# Patient Record
Sex: Male | Born: 1950
Health system: Southern US, Community
[De-identification: ages and names within clinical notes are randomized; demographics above are authoritative.]

## PROBLEM LIST (undated history)

## (undated) DIAGNOSIS — E785 Hyperlipidemia, unspecified: Secondary | ICD-10-CM

## (undated) DIAGNOSIS — I10 Essential (primary) hypertension: Secondary | ICD-10-CM

## (undated) DIAGNOSIS — E119 Type 2 diabetes mellitus without complications: Secondary | ICD-10-CM

## (undated) DIAGNOSIS — C61 Malignant neoplasm of prostate: Secondary | ICD-10-CM

## (undated) HISTORY — DX: Essential (primary) hypertension: I10

## (undated) HISTORY — DX: Hyperlipidemia, unspecified: E78.5

## (undated) HISTORY — PX: OTHER SURGICAL HISTORY: SHX169

## (undated) HISTORY — DX: Malignant neoplasm of prostate: C61

## (undated) HISTORY — DX: Type 2 diabetes mellitus without complications: E11.9

---

## 2005-06-18 ENCOUNTER — Ambulatory Visit: Payer: Self-pay | Admitting: Gastroenterology

## 2005-06-24 ENCOUNTER — Ambulatory Visit: Payer: Self-pay | Admitting: Gastroenterology

## 2007-04-13 HISTORY — PX: HERNIA REPAIR: SHX51

## 2008-01-23 ENCOUNTER — Ambulatory Visit: Payer: Self-pay | Admitting: Rheumatology

## 2014-09-17 DIAGNOSIS — R972 Elevated prostate specific antigen [PSA]: Secondary | ICD-10-CM | POA: Diagnosis not present

## 2014-10-18 DIAGNOSIS — R972 Elevated prostate specific antigen [PSA]: Secondary | ICD-10-CM | POA: Insufficient documentation

## 2014-11-14 DIAGNOSIS — M545 Low back pain: Secondary | ICD-10-CM | POA: Diagnosis not present

## 2014-11-14 DIAGNOSIS — E782 Mixed hyperlipidemia: Secondary | ICD-10-CM | POA: Diagnosis not present

## 2014-11-14 DIAGNOSIS — I10 Essential (primary) hypertension: Secondary | ICD-10-CM | POA: Diagnosis not present

## 2014-11-14 DIAGNOSIS — L409 Psoriasis, unspecified: Secondary | ICD-10-CM | POA: Diagnosis not present

## 2014-11-14 DIAGNOSIS — Z0001 Encounter for general adult medical examination with abnormal findings: Secondary | ICD-10-CM | POA: Diagnosis not present

## 2014-11-14 DIAGNOSIS — M064 Inflammatory polyarthropathy: Secondary | ICD-10-CM | POA: Diagnosis not present

## 2014-12-14 DIAGNOSIS — Z23 Encounter for immunization: Secondary | ICD-10-CM | POA: Diagnosis not present

## 2015-02-14 DIAGNOSIS — E782 Mixed hyperlipidemia: Secondary | ICD-10-CM | POA: Diagnosis not present

## 2015-02-14 DIAGNOSIS — N402 Nodular prostate without lower urinary tract symptoms: Secondary | ICD-10-CM | POA: Diagnosis not present

## 2015-02-14 DIAGNOSIS — I10 Essential (primary) hypertension: Secondary | ICD-10-CM | POA: Diagnosis not present

## 2015-02-14 DIAGNOSIS — M545 Low back pain: Secondary | ICD-10-CM | POA: Diagnosis not present

## 2015-02-14 DIAGNOSIS — K219 Gastro-esophageal reflux disease without esophagitis: Secondary | ICD-10-CM | POA: Diagnosis not present

## 2015-02-14 DIAGNOSIS — M064 Inflammatory polyarthropathy: Secondary | ICD-10-CM | POA: Diagnosis not present

## 2015-05-12 DIAGNOSIS — E782 Mixed hyperlipidemia: Secondary | ICD-10-CM | POA: Diagnosis not present

## 2015-05-12 DIAGNOSIS — I10 Essential (primary) hypertension: Secondary | ICD-10-CM | POA: Diagnosis not present

## 2015-05-12 DIAGNOSIS — M150 Primary generalized (osteo)arthritis: Secondary | ICD-10-CM | POA: Diagnosis not present

## 2015-05-12 DIAGNOSIS — M545 Low back pain: Secondary | ICD-10-CM | POA: Diagnosis not present

## 2015-05-12 DIAGNOSIS — K219 Gastro-esophageal reflux disease without esophagitis: Secondary | ICD-10-CM | POA: Diagnosis not present

## 2015-05-12 DIAGNOSIS — Z1159 Encounter for screening for other viral diseases: Secondary | ICD-10-CM | POA: Diagnosis not present

## 2015-06-30 DIAGNOSIS — E782 Mixed hyperlipidemia: Secondary | ICD-10-CM | POA: Diagnosis not present

## 2015-06-30 DIAGNOSIS — R7301 Impaired fasting glucose: Secondary | ICD-10-CM | POA: Diagnosis not present

## 2015-06-30 DIAGNOSIS — Z1159 Encounter for screening for other viral diseases: Secondary | ICD-10-CM | POA: Diagnosis not present

## 2015-06-30 DIAGNOSIS — I10 Essential (primary) hypertension: Secondary | ICD-10-CM | POA: Diagnosis not present

## 2015-06-30 DIAGNOSIS — Z0001 Encounter for general adult medical examination with abnormal findings: Secondary | ICD-10-CM | POA: Diagnosis not present

## 2015-12-05 DIAGNOSIS — E782 Mixed hyperlipidemia: Secondary | ICD-10-CM | POA: Diagnosis not present

## 2015-12-05 DIAGNOSIS — Z0001 Encounter for general adult medical examination with abnormal findings: Secondary | ICD-10-CM | POA: Diagnosis not present

## 2015-12-05 DIAGNOSIS — M545 Low back pain: Secondary | ICD-10-CM | POA: Diagnosis not present

## 2015-12-05 DIAGNOSIS — Z1159 Encounter for screening for other viral diseases: Secondary | ICD-10-CM | POA: Diagnosis not present

## 2015-12-05 DIAGNOSIS — M150 Primary generalized (osteo)arthritis: Secondary | ICD-10-CM | POA: Diagnosis not present

## 2015-12-05 DIAGNOSIS — Z125 Encounter for screening for malignant neoplasm of prostate: Secondary | ICD-10-CM | POA: Diagnosis not present

## 2015-12-05 DIAGNOSIS — N402 Nodular prostate without lower urinary tract symptoms: Secondary | ICD-10-CM | POA: Diagnosis not present

## 2015-12-05 DIAGNOSIS — I10 Essential (primary) hypertension: Secondary | ICD-10-CM | POA: Diagnosis not present

## 2016-01-20 DIAGNOSIS — Z23 Encounter for immunization: Secondary | ICD-10-CM | POA: Diagnosis not present

## 2016-02-25 DIAGNOSIS — Z125 Encounter for screening for malignant neoplasm of prostate: Secondary | ICD-10-CM | POA: Diagnosis not present

## 2016-02-25 DIAGNOSIS — E782 Mixed hyperlipidemia: Secondary | ICD-10-CM | POA: Diagnosis not present

## 2016-02-25 DIAGNOSIS — I10 Essential (primary) hypertension: Secondary | ICD-10-CM | POA: Diagnosis not present

## 2016-02-25 DIAGNOSIS — Z0001 Encounter for general adult medical examination with abnormal findings: Secondary | ICD-10-CM | POA: Diagnosis not present

## 2016-05-17 DIAGNOSIS — L728 Other follicular cysts of the skin and subcutaneous tissue: Secondary | ICD-10-CM | POA: Diagnosis not present

## 2016-05-31 DIAGNOSIS — L409 Psoriasis, unspecified: Secondary | ICD-10-CM | POA: Diagnosis not present

## 2016-05-31 DIAGNOSIS — E782 Mixed hyperlipidemia: Secondary | ICD-10-CM | POA: Diagnosis not present

## 2016-05-31 DIAGNOSIS — I10 Essential (primary) hypertension: Secondary | ICD-10-CM | POA: Diagnosis not present

## 2016-05-31 DIAGNOSIS — M545 Low back pain: Secondary | ICD-10-CM | POA: Diagnosis not present

## 2016-08-30 DIAGNOSIS — L409 Psoriasis, unspecified: Secondary | ICD-10-CM | POA: Diagnosis not present

## 2016-08-30 DIAGNOSIS — I10 Essential (primary) hypertension: Secondary | ICD-10-CM | POA: Diagnosis not present

## 2016-08-30 DIAGNOSIS — E782 Mixed hyperlipidemia: Secondary | ICD-10-CM | POA: Diagnosis not present

## 2016-08-30 DIAGNOSIS — M545 Low back pain: Secondary | ICD-10-CM | POA: Diagnosis not present

## 2017-01-29 DIAGNOSIS — Z23 Encounter for immunization: Secondary | ICD-10-CM | POA: Diagnosis not present

## 2017-02-03 DIAGNOSIS — I10 Essential (primary) hypertension: Secondary | ICD-10-CM | POA: Diagnosis not present

## 2017-02-03 DIAGNOSIS — Z0001 Encounter for general adult medical examination with abnormal findings: Secondary | ICD-10-CM | POA: Diagnosis not present

## 2017-02-03 DIAGNOSIS — Z125 Encounter for screening for malignant neoplasm of prostate: Secondary | ICD-10-CM | POA: Diagnosis not present

## 2017-02-03 DIAGNOSIS — Z1211 Encounter for screening for malignant neoplasm of colon: Secondary | ICD-10-CM | POA: Diagnosis not present

## 2017-02-03 DIAGNOSIS — E782 Mixed hyperlipidemia: Secondary | ICD-10-CM | POA: Diagnosis not present

## 2017-03-07 DIAGNOSIS — R7301 Impaired fasting glucose: Secondary | ICD-10-CM | POA: Diagnosis not present

## 2017-03-07 DIAGNOSIS — I10 Essential (primary) hypertension: Secondary | ICD-10-CM | POA: Diagnosis not present

## 2017-03-07 DIAGNOSIS — R972 Elevated prostate specific antigen [PSA]: Secondary | ICD-10-CM | POA: Diagnosis not present

## 2017-03-07 DIAGNOSIS — E782 Mixed hyperlipidemia: Secondary | ICD-10-CM | POA: Diagnosis not present

## 2017-03-07 DIAGNOSIS — M545 Low back pain: Secondary | ICD-10-CM | POA: Diagnosis not present

## 2017-03-23 ENCOUNTER — Other Ambulatory Visit: Payer: Self-pay | Admitting: Nurse Practitioner

## 2017-03-23 DIAGNOSIS — N402 Nodular prostate without lower urinary tract symptoms: Secondary | ICD-10-CM | POA: Diagnosis not present

## 2017-03-23 DIAGNOSIS — R7301 Impaired fasting glucose: Secondary | ICD-10-CM | POA: Diagnosis not present

## 2017-03-23 DIAGNOSIS — R972 Elevated prostate specific antigen [PSA]: Secondary | ICD-10-CM | POA: Diagnosis not present

## 2017-03-30 ENCOUNTER — Other Ambulatory Visit: Payer: Self-pay | Admitting: Nurse Practitioner

## 2017-03-30 MED ORDER — ROSUVASTATIN CALCIUM 20 MG PO TABS: 20.0000 mg | ORAL_TABLET | Freq: Every day | ORAL | 1 refills | Status: DC

## 2017-03-31 ENCOUNTER — Other Ambulatory Visit: Payer: Self-pay | Admitting: Nurse Practitioner

## 2017-04-18 ENCOUNTER — Other Ambulatory Visit: Payer: Self-pay

## 2017-04-18 ENCOUNTER — Encounter: Payer: Self-pay | Admitting: Nurse Practitioner

## 2017-04-18 DIAGNOSIS — R5383 Other fatigue: Secondary | ICD-10-CM | POA: Insufficient documentation

## 2017-04-18 DIAGNOSIS — M545 Low back pain, unspecified: Secondary | ICD-10-CM | POA: Insufficient documentation

## 2017-04-18 DIAGNOSIS — I10 Essential (primary) hypertension: Secondary | ICD-10-CM | POA: Insufficient documentation

## 2017-04-18 DIAGNOSIS — E1169 Type 2 diabetes mellitus with other specified complication: Secondary | ICD-10-CM | POA: Insufficient documentation

## 2017-04-18 DIAGNOSIS — F1190 Opioid use, unspecified, uncomplicated: Secondary | ICD-10-CM | POA: Insufficient documentation

## 2017-04-18 DIAGNOSIS — M064 Inflammatory polyarthropathy: Secondary | ICD-10-CM | POA: Insufficient documentation

## 2017-04-18 DIAGNOSIS — K219 Gastro-esophageal reflux disease without esophagitis: Secondary | ICD-10-CM | POA: Insufficient documentation

## 2017-04-18 DIAGNOSIS — M150 Primary generalized (osteo)arthritis: Secondary | ICD-10-CM | POA: Insufficient documentation

## 2017-04-18 DIAGNOSIS — M0579 Rheumatoid arthritis with rheumatoid factor of multiple sites without organ or systems involvement: Secondary | ICD-10-CM | POA: Insufficient documentation

## 2017-04-18 DIAGNOSIS — L409 Psoriasis, unspecified: Secondary | ICD-10-CM | POA: Insufficient documentation

## 2017-04-18 DIAGNOSIS — R7301 Impaired fasting glucose: Secondary | ICD-10-CM | POA: Insufficient documentation

## 2017-04-18 DIAGNOSIS — N402 Nodular prostate without lower urinary tract symptoms: Secondary | ICD-10-CM | POA: Insufficient documentation

## 2017-04-18 DIAGNOSIS — E782 Mixed hyperlipidemia: Secondary | ICD-10-CM | POA: Insufficient documentation

## 2017-04-18 DIAGNOSIS — E1159 Type 2 diabetes mellitus with other circulatory complications: Secondary | ICD-10-CM | POA: Insufficient documentation

## 2017-04-18 DIAGNOSIS — F17210 Nicotine dependence, cigarettes, uncomplicated: Secondary | ICD-10-CM | POA: Insufficient documentation

## 2017-04-29 LAB — HGB A1C W/O EAG: Hgb A1c MFr Bld: 6.1 % — ABNORMAL HIGH (ref 4.8–5.6)

## 2017-04-29 LAB — PSA: PROSTATE SPECIFIC AG, SERUM: 5.3 ng/mL — AB (ref 0.0–4.0)

## 2017-05-06 ENCOUNTER — Ambulatory Visit: Payer: Self-pay | Admitting: Nurse Practitioner

## 2017-06-13 ENCOUNTER — Encounter: Payer: Self-pay | Admitting: Nurse Practitioner

## 2017-06-13 ENCOUNTER — Ambulatory Visit: Payer: Medicare Other | Admitting: Nurse Practitioner

## 2017-06-13 VITALS — BP 140/80 | HR 69 | Resp 16 | Ht 70.0 in | Wt 245.0 lb

## 2017-06-13 DIAGNOSIS — N402 Nodular prostate without lower urinary tract symptoms: Secondary | ICD-10-CM

## 2017-06-13 DIAGNOSIS — E782 Mixed hyperlipidemia: Secondary | ICD-10-CM | POA: Diagnosis not present

## 2017-06-13 DIAGNOSIS — M545 Low back pain: Secondary | ICD-10-CM | POA: Diagnosis not present

## 2017-06-13 DIAGNOSIS — K219 Gastro-esophageal reflux disease without esophagitis: Secondary | ICD-10-CM | POA: Diagnosis not present

## 2017-06-13 DIAGNOSIS — I10 Essential (primary) hypertension: Secondary | ICD-10-CM

## 2017-06-13 MED ORDER — HYDROCODONE-ACETAMINOPHEN 7.5-325 MG PO TABS: 1.0000 | ORAL_TABLET | Freq: Two times a day (BID) | ORAL | 0 refills | Status: DC | PRN

## 2017-06-13 MED ORDER — MELOXICAM 15 MG PO TABS: 15.0000 mg | ORAL_TABLET | Freq: Every day | ORAL | 5 refills | Status: DC | PRN

## 2017-06-13 MED ORDER — ROSUVASTATIN CALCIUM 20 MG PO TABS: 20.0000 mg | ORAL_TABLET | Freq: Every day | ORAL | 5 refills | Status: DC

## 2017-06-13 MED ORDER — TRAMADOL HCL 50 MG PO TABS: 50.0000 mg | ORAL_TABLET | Freq: Two times a day (BID) | ORAL | 3 refills | Status: DC | PRN

## 2017-06-13 MED ORDER — PANTOPRAZOLE SODIUM 40 MG PO TBEC: 40.0000 mg | DELAYED_RELEASE_TABLET | Freq: Every day | ORAL | 5 refills | Status: DC

## 2017-06-13 MED ORDER — BENAZEPRIL-HYDROCHLOROTHIAZIDE 20-25 MG PO TABS: 1.0000 | ORAL_TABLET | Freq: Every day | ORAL | 5 refills | Status: DC

## 2017-06-13 NOTE — Progress Notes (Signed)
[facility] [place], [place] [address]  Internal MEDICINE  Office Visit Note  Patient Name: Russell Sullivan  [id number]  [id number]  Date of Service: 06/29/2017  Chief Complaint  Patient presents with  . Back Pain    Hid back pain is chronic in nature. He is physically active. states that his back will completely "go out" on him when he is working. has been taking tylenol arthritis 650mg  tablets in addition to tramadol and hydrocodone due to back and leg pain being worse since weather has been cold and damp. will take a tramadol 50mg  tablets when pain gets to be 6/10 in severity. Will take hydrocodone/APAP 7.5/325mg  twice daily, when pain reaches 8/10 in severity. Brings his pain down to 4/10 in severity. Helps to keep him active and allows him to participate in ADLs. He will take his tramadol in between the doses of hydrocodone so an extra dose of the hydrocodone is not needed. is having increased nocturia along with hesitancy.    Back Pain  This is a chronic problem. The current episode started more than 1 year ago. The problem occurs daily. The problem is unchanged. The pain is present in the lumbar spine and sacro-iliac. The quality of the pain is described as aching. The pain is moderate. The pain is worse during the night. The symptoms are aggravated by stress and standing. Stiffness is present at night. Associated symptoms include leg pain. Pertinent negatives include no abdominal pain, chest pain, dysuria, headaches or numbness. He has tried analgesics, muscle relaxant and NSAIDs for the symptoms. The treatment provided moderate relief.    Pt is here for routine follow up.    Current Medication: Outpatient Encounter Medications as of 06/13/2017  Medication Sig  . aspirin 81 MG tablet Take 81 mg by mouth as needed for pain.  . benazepril-hydrochlorthiazide (LOTENSIN HCT) 20-25 MG tablet Take 1 tablet by mouth daily.  Marland Kitchen HYDROcodone-acetaminophen (NORCO)  7.5-325 MG tablet Take 1 tablet by mouth 2 (two) times daily as needed for moderate pain. To refill after 05/03/2017  . meloxicam (MOBIC) 15 MG tablet Take 1 tablet (15 mg total) by mouth daily as needed (for arthritis).  . pantoprazole (PROTONIX) 40 MG tablet Take 1 tablet (40 mg total) by mouth daily.  . rosuvastatin (CRESTOR) 20 MG tablet Take 1 tablet (20 mg total) by mouth at bedtime.  . sildenafil (REVATIO) 20 MG tablet Take 20 mg by mouth as needed.  . tamsulosin (FLOMAX) 0.4 MG CAPS capsule Take 0.4 mg by mouth daily.  . traMADol (ULTRAM) 50 MG tablet Take 1 tablet (50 mg total) by mouth 2 (two) times daily as needed.  . [DISCONTINUED] benazepril-hydrochlorthiazide (LOTENSIN HCT) 20-25 MG tablet Take 1 tablet by mouth daily.  . [DISCONTINUED] HYDROcodone-acetaminophen (NORCO) 7.5-325 MG tablet Take 1 tablet by mouth 2 (two) times daily as needed for moderate pain. To refill after 05/03/2017  . [DISCONTINUED] HYDROcodone-acetaminophen (NORCO) 7.5-325 MG tablet Take 1 tablet by mouth 2 (two) times daily as needed for moderate pain. To refill after 05/03/2017  . [DISCONTINUED] HYDROcodone-acetaminophen (NORCO) 7.5-325 MG tablet Take 1 tablet by mouth 2 (two) times daily as needed for moderate pain. To refill after 05/03/2017  . [DISCONTINUED] meloxicam (MOBIC) 15 MG tablet Take 15 mg by mouth daily as needed (for arthritis).  . [DISCONTINUED] pantoprazole (PROTONIX) 40 MG tablet Take 40 mg by mouth daily.  . [DISCONTINUED] rosuvastatin (CRESTOR) 20 MG tablet Take 1 tablet (20 mg total) by mouth at  bedtime.  . [DISCONTINUED] traMADol (ULTRAM) 50 MG tablet Take 50 mg by mouth 2 (two) times daily as needed.  . fluticasone (FLONASE) 50 MCG/ACT nasal spray Place 2 sprays into both nostrils daily.  Marland Kitchen triamcinolone cream (KENALOG) 0.1 % Apply 1 application topically as needed.  . vardenafil (LEVITRA) 20 MG tablet Take 20 mg by mouth as needed for erectile dysfunction.   No facility-administered  encounter medications on file as of 06/13/2017.     Surgical History: Past Surgical History:  Procedure Laterality Date  . HERNIA REPAIR  2009  . left shoulder surgery      Medical History: Past Medical History:  Diagnosis Date  . Hyperlipidemia   . Hypertension     Family History: Family History  Problem Relation Age of Onset  . Hypertension Mother   . Diabetes Mother   . Hypertension Father   . Diabetes Maternal Grandmother   . Diabetes Paternal Grandmother     Social History   Socioeconomic History  . Marital status: Married    Spouse name: Not on file  . Number of children: Not on file  . Years of education: Not on file  . Highest education level: Not on file  Social Needs  . Financial resource strain: Not on file  . Food insecurity - worry: Not on file  . Food insecurity - inability: Not on file  . Transportation needs - medical: Not on file  . Transportation needs - non-medical: Not on file  Occupational History  . Not on file  Tobacco Use  . Smoking status: Never Smoker  . Smokeless tobacco: Never Used  Substance and Sexual Activity  . Alcohol use: No    Frequency: Never  . Drug use: No  . Sexual activity: Not on file  Other Topics Concern  . Not on file  Social History Narrative  . Not on file      Review of Systems  Constitutional: Negative for activity change, chills, fatigue and unexpected weight change.  HENT: Positive for postnasal drip. Negative for congestion, rhinorrhea, sneezing and sore throat.   Eyes: Negative.  Negative for redness.  Respiratory: Negative for cough, chest tightness and shortness of breath.   Cardiovascular: Negative for chest pain and palpitations.  Gastrointestinal: Negative for abdominal pain, constipation, diarrhea, nausea and vomiting.  Endocrine: Negative for cold intolerance, heat intolerance, polydipsia, polyphagia and polyuria.  Genitourinary: Positive for frequency and urgency. Negative for dysuria.   Musculoskeletal: Positive for back pain and myalgias. Negative for arthralgias, joint swelling and neck pain.  Skin: Negative for rash.  Allergic/Immunologic: Negative for environmental allergies.  Neurological: Negative for tremors, numbness and headaches.  Hematological: Negative for adenopathy. Does not bruise/bleed easily.  Psychiatric/Behavioral: Negative for behavioral problems (Depression), sleep disturbance and suicidal ideas. The patient is not nervous/anxious.     Today's Vitals   06/13/17 1425  BP: 140/80  Pulse: 69  Resp: 16  SpO2: 98%  Weight: 245 lb (111.1 kg)  Height: 5\' 10"  (1.778 m)    Physical Exam  Constitutional: He is oriented to person, place, and time. He appears well-developed and well-nourished. No distress.  HENT:  Head: Normocephalic and atraumatic.  Mouth/Throat: Oropharynx is clear and moist. No oropharyngeal exudate.  Eyes: EOM are normal. Pupils are equal, round, and reactive to light.  Neck: Normal range of motion. Neck supple. No JVD present. Carotid bruit is not present. No tracheal deviation present. No thyromegaly present.  Cardiovascular: Normal rate, regular rhythm and normal heart sounds. Exam  reveals no gallop and no friction rub.  No murmur heard. Pulmonary/Chest: Effort normal and breath sounds normal. No respiratory distress. He has no wheezes. He has no rales. He exhibits no tenderness.  Abdominal: Soft. Bowel sounds are normal. There is no tenderness.  Musculoskeletal: Normal range of motion.  Moderate lower back pain, worse with bending and twisting at the waist. Worse with pushing, pulling, and lifting with the arms. No visible or palpable abnormalities or deformities noted today.  Lymphadenopathy:    He has no cervical adenopathy.  Neurological: He is alert and oriented to person, place, and time. No cranial nerve deficit.  Skin: Skin is warm and dry. He is not diaphoretic.  Psychiatric: He has a normal mood and affect. His behavior  is normal. Judgment and thought content normal.  Nursing note and vitals reviewed.  Assessment/Plan: 1. Essential (primary) hypertension - benazepril-hydrochlorthiazide (LOTENSIN HCT) 20-25 MG tablet; Take 1 tablet by mouth daily.  Dispense: 30 tablet; Refill: 5  2. Nodular prostate without lower urinary tract symptoms Elevated PSA.  - Ambulatory referral to Urology  3. Low back pain, unspecified back pain laterality, unspecified chronicity, with sciatica presence unspecified Continue meloxicam 15mg  daily. Take tramadol 50mg  twice daily as needed for mild to moderate breakthrough pain. Hydrocodone/APAP 7.5/325mg  tablets may be taken twice daily if needed for more severe breakthrough pain. Three 30 day prescriptions were provided. Dates are 06/13/2017, 07/12/2017, and 08/09/2017. - meloxicam (MOBIC) 15 MG tablet; Take 1 tablet (15 mg total) by mouth daily as needed (for arthritis).  Dispense: 30 tablet; Refill: 5 - traMADol (ULTRAM) 50 MG tablet; Take 1 tablet (50 mg total) by mouth 2 (two) times daily as needed.  Dispense: 60 tablet; Refill: 3 - HYDROcodone-acetaminophen (NORCO) 7.5-325 MG tablet; Take 1 tablet by mouth 2 (two) times daily as needed for moderate pain. To refill after 05/03/2017  Dispense: 60 tablet; Refill: 0  4. Mixed hyperlipidemia - rosuvastatin (CRESTOR) 20 MG tablet; Take 1 tablet (20 mg total) by mouth at bedtime.  Dispense: 30 tablet; Refill: 5  5. Gastro-esophageal reflux disease without esophagitis - pantoprazole (PROTONIX) 40 MG tablet; Take 1 tablet (40 mg total) by mouth daily.  Dispense: 30 tablet; Refill: 5   General Counseling: Hershey verbalizes understanding of the findings of todays visit and agrees with plan of treatment. I have discussed any further diagnostic evaluation that may be needed or ordered today. We also reviewed his medications today. he has been encouraged to call the office with any questions or concerns that should arise related to todays  visit.  Reviewed risks and possible side effects associated with taking opiates and benzodiazepines. Combination of these could cause dizziness and drowsiness. Advised him not to drive or operate machinery when taking these medications, as he could put his life and the lives of others at risk. He voiced understanding.   This patient was seen by Leretha Pol, FNP- C in Collaboration with Dr Lavera Guise as a part of collaborative care agreement    Orders Placed This Encounter  Procedures  . Ambulatory referral to Urology    Meds ordered this encounter  Medications  . benazepril-hydrochlorthiazide (LOTENSIN HCT) 20-25 MG tablet    Sig: Take 1 tablet by mouth daily.    Dispense:  30 tablet    Refill:  5    Order Specific Question:   Supervising Provider    Answer:   Lavera Guise [[id number]]  . meloxicam (MOBIC) 15 MG tablet    Sig: Take  1 tablet (15 mg total) by mouth daily as needed (for arthritis).    Dispense:  30 tablet    Refill:  5    Order Specific Question:   Supervising Provider    Answer:   Lavera Guise [[id number]]  . pantoprazole (PROTONIX) 40 MG tablet    Sig: Take 1 tablet (40 mg total) by mouth daily.    Dispense:  30 tablet    Refill:  5    Order Specific Question:   Supervising Provider    Answer:   Lavera Guise [[id number]]  . rosuvastatin (CRESTOR) 20 MG tablet    Sig: Take 1 tablet (20 mg total) by mouth at bedtime.    Dispense:  30 tablet    Refill:  5    Order Specific Question:   Supervising Provider    Answer:   Lavera Guise [[id number]]  . traMADol (ULTRAM) 50 MG tablet    Sig: Take 1 tablet (50 mg total) by mouth 2 (two) times daily as needed.    Dispense:  60 tablet    Refill:  3    Order Specific Question:   Supervising Provider    Answer:   Lavera Guise [[id number]]  . DISCONTD: HYDROcodone-acetaminophen (NORCO) 7.5-325 MG tablet    Sig: Take 1 tablet by mouth 2 (two) times daily as needed for moderate pain. To refill after 05/03/2017    Dispense:  60 tablet     Refill:  0    Order Specific Question:   Supervising Provider    Answer:   Lavera Guise [[id number]]  . DISCONTD: HYDROcodone-acetaminophen (NORCO) 7.5-325 MG tablet    Sig: Take 1 tablet by mouth 2 (two) times daily as needed for moderate pain. To refill after 05/03/2017    Dispense:  60 tablet    Refill:  0    Fill after 07/12/2017    Order Specific Question:   Supervising Provider    Answer:   Lavera Guise [place]  . HYDROcodone-acetaminophen (NORCO) 7.5-325 MG tablet    Sig: Take 1 tablet by mouth 2 (two) times daily as needed for moderate pain. To refill after 05/03/2017    Dispense:  60 tablet    Refill:  0    Fill after 08/09/2017    Order Specific Question:   Supervising Provider    Answer:   Lavera Guise [[id number]]    Time spent: 25 Minutes  Dr Lavera Guise Internal medicine

## 2017-06-28 ENCOUNTER — Encounter: Payer: Self-pay | Admitting: Nurse Practitioner

## 2017-06-28 ENCOUNTER — Telehealth: Payer: Self-pay | Admitting: Nurse Practitioner

## 2017-06-28 ENCOUNTER — Ambulatory Visit: Payer: Medicare Other | Admitting: Nurse Practitioner

## 2017-06-28 VITALS — BP 148/79 | HR 98 | Temp 98.9°F | Resp 16 | Ht 70.0 in | Wt 221.0 lb

## 2017-06-28 DIAGNOSIS — R229 Localized swelling, mass and lump, unspecified: Secondary | ICD-10-CM | POA: Insufficient documentation

## 2017-06-28 NOTE — Progress Notes (Signed)
[facility] [place], [place] [address]  Internal MEDICINE  Office Visit Note  Patient Name: Russell Sullivan  [id number]  [id number]  Date of Service: 06/28/2017  Chief Complaint  Patient presents with  . Other    lump on left middle finger      The patient is here for sick visit. Has growth on the tip of middle finger. He states this has been there for "60 years." Over the last week or so, this has become larger and tender. He states that he did put pin in it to drain some of the fluid, make sure it was not infected. Only clear fluid came out. He states that since then, the size of the swelling has diminished some and is no longer tender.    Pt is here for a sick visit.     Current Medication:  Outpatient Encounter Medications as of 06/28/2017  Medication Sig  . aspirin 81 MG tablet Take 81 mg by mouth as needed for pain.  . benazepril-hydrochlorthiazide (LOTENSIN HCT) 20-25 MG tablet Take 1 tablet by mouth daily.  . fluticasone (FLONASE) 50 MCG/ACT nasal spray Place 2 sprays into both nostrils daily.  Marland Kitchen HYDROcodone-acetaminophen (NORCO) 7.5-325 MG tablet Take 1 tablet by mouth 2 (two) times daily as needed for moderate pain. To refill after 05/03/2017  . meloxicam (MOBIC) 15 MG tablet Take 1 tablet (15 mg total) by mouth daily as needed (for arthritis).  . pantoprazole (PROTONIX) 40 MG tablet Take 1 tablet (40 mg total) by mouth daily.  . rosuvastatin (CRESTOR) 20 MG tablet Take 1 tablet (20 mg total) by mouth at bedtime.  . sildenafil (REVATIO) 20 MG tablet Take 20 mg by mouth as needed.  . tamsulosin (FLOMAX) 0.4 MG CAPS capsule Take 0.4 mg by mouth daily.  . traMADol (ULTRAM) 50 MG tablet Take 1 tablet (50 mg total) by mouth 2 (two) times daily as needed.  . triamcinolone cream (KENALOG) 0.1 % Apply 1 application topically as needed.  . vardenafil (LEVITRA) 20 MG tablet Take 20 mg by mouth as needed for erectile dysfunction.   No  facility-administered encounter medications on file as of 06/28/2017.       Medical History: Past Medical History:  Diagnosis Date  . Hyperlipidemia   . Hypertension      Today's Vitals   06/28/17 0905  BP: (!) 148/79  Pulse: 98  Resp: 16  Temp: 98.9 F (37.2 C)  SpO2: 98%  Weight: 221 lb (100.2 kg)  Height: 5\' 10"  (1.778 m)    Review of Systems  Constitutional: Negative for activity change, chills, fatigue and unexpected weight change.  HENT: Negative for congestion, postnasal drip, rhinorrhea, sneezing and sore throat.   Eyes: Negative.  Negative for redness.  Respiratory: Negative for cough, chest tightness and shortness of breath.   Cardiovascular: Negative for chest pain and palpitations.  Gastrointestinal: Negative for abdominal pain, constipation, diarrhea, nausea and vomiting.  Endocrine: Negative for cold intolerance, heat intolerance, polydipsia, polyphagia and polyuria.  Genitourinary: Negative for dysuria, frequency and urgency.  Musculoskeletal: Negative for arthralgias, back pain, joint swelling, myalgias and neck pain.  Skin: Negative for rash.       Swelling under skin of middle finger of left hand.   Allergic/Immunologic: Negative for environmental allergies.  Neurological: Negative for tremors, numbness and headaches.  Hematological: Negative for adenopathy. Does not bruise/bleed easily.  Psychiatric/Behavioral: Negative for behavioral problems (Depression), sleep disturbance and suicidal ideas. The patient is not nervous/anxious.  Physical Exam  Constitutional: He is oriented to person, place, and time. He appears well-developed and well-nourished. No distress.  HENT:  Head: Normocephalic and atraumatic.  Mouth/Throat: Oropharynx is clear and moist. No oropharyngeal exudate.  Eyes: EOM are normal. Pupils are equal, round, and reactive to light.  Neck: Normal range of motion. Neck supple. No JVD present. Carotid bruit is not present. No tracheal  deviation present. No thyromegaly present.  Cardiovascular: Normal rate, regular rhythm and normal heart sounds. Exam reveals no gallop and no friction rub.  No murmur heard. Pulmonary/Chest: Effort normal and breath sounds normal. No respiratory distress. He has no wheezes. He has no rales. He exhibits no tenderness.  Abdominal: Soft. Bowel sounds are normal. There is no tenderness.  Musculoskeletal: Normal range of motion.  Moderate lower back pain, worse with bending and twisting at the waist. Worse with pushing, pulling, and lifting with the arms. No visible or palpable abnormalities or deformities noted today.  Lymphadenopathy:    He has no cervical adenopathy.  Neurological: He is alert and oriented to person, place, and time. No cranial nerve deficit.  Skin: Skin is warm and dry. He is not diaphoretic.     Psychiatric: He has a normal mood and affect. His behavior is normal. Judgment and thought content normal.  Nursing note and vitals reviewed.  Assessment/Plan:  1. Localized superficial swelling, mass, or lump Cyst-like lesion on tip of left middle finger.  - Ambulatory referral to General Surgery  General Counseling: Russell Sullivan verbalizes understanding of the findings of todays visit and agrees with plan of treatment. I have discussed any further diagnostic evaluation that may be needed or ordered today. We also reviewed his medications today. he has been encouraged to call the office with any questions or concerns that should arise related to todays visit.    This patient was seen by Leretha Pol, FNP- C in Collaboration with Dr Lavera Guise as a part of collaborative care agreement    Orders Placed This Encounter  Procedures  . Ambulatory referral to General Surgery      Time spent: 15 Minutes

## 2017-06-28 NOTE — Telephone Encounter (Signed)
Should this patient go to dermatology then? Can you check for me? thanks

## 2017-06-29 ENCOUNTER — Encounter: Payer: Self-pay | Admitting: Nurse Practitioner

## 2017-07-04 ENCOUNTER — Other Ambulatory Visit: Payer: Self-pay

## 2017-07-04 MED ORDER — TAMSULOSIN HCL 0.4 MG PO CAPS: 0.4000 mg | ORAL_CAPSULE | Freq: Every day | ORAL | 1 refills | Status: DC

## 2017-07-06 ENCOUNTER — Ambulatory Visit: Payer: Self-pay | Admitting: Urology

## 2017-09-07 DIAGNOSIS — Z8249 Family history of ischemic heart disease and other diseases of the circulatory system: Secondary | ICD-10-CM | POA: Diagnosis not present

## 2017-09-07 DIAGNOSIS — Z79899 Other long term (current) drug therapy: Secondary | ICD-10-CM | POA: Diagnosis not present

## 2017-09-07 DIAGNOSIS — I10 Essential (primary) hypertension: Secondary | ICD-10-CM | POA: Diagnosis not present

## 2017-09-07 DIAGNOSIS — Z8042 Family history of malignant neoplasm of prostate: Secondary | ICD-10-CM | POA: Diagnosis not present

## 2017-09-07 DIAGNOSIS — E785 Hyperlipidemia, unspecified: Secondary | ICD-10-CM | POA: Diagnosis not present

## 2017-09-07 DIAGNOSIS — R972 Elevated prostate specific antigen [PSA]: Secondary | ICD-10-CM | POA: Diagnosis not present

## 2017-09-07 DIAGNOSIS — Z125 Encounter for screening for malignant neoplasm of prostate: Secondary | ICD-10-CM | POA: Diagnosis not present

## 2017-09-07 DIAGNOSIS — Z87891 Personal history of nicotine dependence: Secondary | ICD-10-CM | POA: Diagnosis not present

## 2017-09-12 ENCOUNTER — Telehealth: Payer: Self-pay

## 2017-09-12 NOTE — Telephone Encounter (Signed)
Faxed [facility] urologist from [facility] [place] last two year labs [phone]

## 2017-09-16 ENCOUNTER — Ambulatory Visit (INDEPENDENT_AMBULATORY_CARE_PROVIDER_SITE_OTHER): Payer: Medicare Other | Admitting: Nurse Practitioner

## 2017-09-16 ENCOUNTER — Encounter: Payer: Self-pay | Admitting: Nurse Practitioner

## 2017-09-16 VITALS — BP 130/80 | HR 74 | Resp 16 | Ht 70.0 in | Wt 218.0 lb

## 2017-09-16 DIAGNOSIS — M545 Low back pain: Secondary | ICD-10-CM

## 2017-09-16 DIAGNOSIS — E782 Mixed hyperlipidemia: Secondary | ICD-10-CM

## 2017-09-16 DIAGNOSIS — I10 Essential (primary) hypertension: Secondary | ICD-10-CM

## 2017-09-16 DIAGNOSIS — K219 Gastro-esophageal reflux disease without esophagitis: Secondary | ICD-10-CM | POA: Diagnosis not present

## 2017-09-16 DIAGNOSIS — M150 Primary generalized (osteo)arthritis: Secondary | ICD-10-CM

## 2017-09-16 DIAGNOSIS — N402 Nodular prostate without lower urinary tract symptoms: Secondary | ICD-10-CM

## 2017-09-16 MED ORDER — HYDROCODONE-ACETAMINOPHEN 7.5-325 MG PO TABS: 1.0000 | ORAL_TABLET | Freq: Two times a day (BID) | ORAL | 0 refills | Status: DC | PRN

## 2017-09-16 MED ORDER — TRAMADOL HCL 50 MG PO TABS: 50.0000 mg | ORAL_TABLET | Freq: Two times a day (BID) | ORAL | 3 refills | Status: DC | PRN

## 2017-09-16 NOTE — Progress Notes (Signed)
[facility] [place], [place] [address]  Internal MEDICINE  Office Visit Note  Patient Name: Russell Sullivan  [id number]  [id number]  Date of Service: 09/16/2017    Pt is here for routine follow up.   Chief Complaint  Patient presents with  . Leg Pain    upper part of left leg .  Marland Kitchen Back Pain    chronic    Hid back pain is chronic in nature. He is physically active. states that his back will completely "go out" on him when he is working. has been taking tylenol arthritis 650mg  tablets in addition to tramadol and hydrocodone due to back and leg pain being worse since weather has been cold and damp. will take a tramadol 50mg  tablets when pain gets to be 6/10 in severity. Will take hydrocodone/APAP 7.5/325mg  twice daily, when pain reaches 8/10 in severity. Brings his pain down to 4/10 in severity. Helps to keep him active and allows him to participate in ADLs. He will take his tramadol in between the doses of hydrocodone so an extra dose of the hydrocodone is not needed. is having increased nocturia along with hesitancy.   Back Pain  This is a chronic problem. The current episode started more than 1 year ago. The problem occurs daily. The problem is unchanged. The pain is present in the lumbar spine and sacro-iliac. The quality of the pain is described as aching. The pain is moderate. The pain is worse during the night. The symptoms are aggravated by stress and standing. Stiffness is present at night. Associated symptoms include leg pain. Pertinent negatives include no abdominal pain, chest pain, dysuria, headaches or numbness. He has tried analgesics, muscle relaxant and NSAIDs for the symptoms. The treatment provided moderate relief.      Current Medication: Outpatient Encounter Medications as of 09/16/2017  Medication Sig  . aspirin 81 MG tablet Take 81 mg by mouth as needed for pain.  . benazepril-hydrochlorthiazide (LOTENSIN HCT) 20-25 MG tablet Take 1 tablet by  mouth daily.  . fluticasone (FLONASE) 50 MCG/ACT nasal spray Place 2 sprays into both nostrils daily.  Marland Kitchen HYDROcodone-acetaminophen (NORCO) 7.5-325 MG tablet Take 1 tablet by mouth 2 (two) times daily as needed for moderate pain. To refill after 05/03/2017  . meloxicam (MOBIC) 15 MG tablet Take 1 tablet (15 mg total) by mouth daily as needed (for arthritis).  . pantoprazole (PROTONIX) 40 MG tablet Take 1 tablet (40 mg total) by mouth daily.  . rosuvastatin (CRESTOR) 20 MG tablet Take 1 tablet (20 mg total) by mouth at bedtime.  . sildenafil (REVATIO) 20 MG tablet Take 20 mg by mouth as needed.  . tamsulosin (FLOMAX) 0.4 MG CAPS capsule Take 1 capsule (0.4 mg total) by mouth daily.  . traMADol (ULTRAM) 50 MG tablet Take 1 tablet (50 mg total) by mouth 2 (two) times daily as needed.  . triamcinolone cream (KENALOG) 0.1 % Apply 1 application topically as needed.  . vardenafil (LEVITRA) 20 MG tablet Take 20 mg by mouth as needed for erectile dysfunction.  . [DISCONTINUED] HYDROcodone-acetaminophen (NORCO) 7.5-325 MG tablet Take 1 tablet by mouth 2 (two) times daily as needed for moderate pain. To refill after 05/03/2017  . [DISCONTINUED] HYDROcodone-acetaminophen (NORCO) 7.5-325 MG tablet Take 1 tablet by mouth 2 (two) times daily as needed for moderate pain. To refill after 05/03/2017  . [DISCONTINUED] HYDROcodone-acetaminophen (NORCO) 7.5-325 MG tablet Take 1 tablet by mouth 2 (two) times daily as needed for moderate pain. To  refill after 05/03/2017  . [DISCONTINUED] traMADol (ULTRAM) 50 MG tablet Take 1 tablet (50 mg total) by mouth 2 (two) times daily as needed.   No facility-administered encounter medications on file as of 09/16/2017.     Surgical History: Past Surgical History:  Procedure Laterality Date  . HERNIA REPAIR  2009  . left shoulder surgery      Medical History: Past Medical History:  Diagnosis Date  . Hyperlipidemia   . Hypertension     Family History: Family History   Problem Relation Age of Onset  . Hypertension Mother   . Diabetes Mother   . Hypertension Father   . Diabetes Maternal Grandmother   . Diabetes Paternal Grandmother     Social History   Socioeconomic History  . Marital status: Married    Spouse name: Not on file  . Number of children: Not on file  . Years of education: Not on file  . Highest education level: Not on file  Occupational History  . Not on file  Social Needs  . Financial resource strain: Not on file  . Food insecurity:    Worry: Not on file    Inability: Not on file  . Transportation needs:    Medical: Not on file    Non-medical: Not on file  Tobacco Use  . Smoking status: Never Smoker  . Smokeless tobacco: Never Used  Substance and Sexual Activity  . Alcohol use: No    Frequency: Never  . Drug use: No  . Sexual activity: Not on file  Lifestyle  . Physical activity:    Days per week: Not on file    Minutes per session: Not on file  . Stress: Not on file  Relationships  . Social connections:    Talks on phone: Not on file    Gets together: Not on file    Attends religious service: Not on file    Active member of club or organization: Not on file    Attends meetings of clubs or organizations: Not on file    Relationship status: Not on file  . Intimate partner violence:    Fear of current or ex partner: Not on file    Emotionally abused: Not on file    Physically abused: Not on file    Forced sexual activity: Not on file  Other Topics Concern  . Not on file  Social History Narrative  . Not on file      Review of Systems  Constitutional: Negative for activity change, chills, fatigue and unexpected weight change.  HENT: Positive for postnasal drip. Negative for congestion, rhinorrhea, sneezing and sore throat.   Eyes: Negative.  Negative for redness.  Respiratory: Negative for cough, chest tightness and shortness of breath.   Cardiovascular: Negative for chest pain and palpitations.   Gastrointestinal: Negative for abdominal pain, constipation, diarrhea, nausea and vomiting.  Endocrine: Negative for cold intolerance, heat intolerance, polydipsia, polyphagia and polyuria.  Genitourinary: Positive for frequency and urgency. Negative for dysuria.       Getting up 2 or 3 times per night to use the bathroom.   Musculoskeletal: Positive for back pain and myalgias. Negative for arthralgias, joint swelling and neck pain.  Skin: Negative for rash.  Allergic/Immunologic: Negative for environmental allergies.  Neurological: Negative for tremors, numbness and headaches.  Hematological: Negative for adenopathy. Does not bruise/bleed easily.  Psychiatric/Behavioral: Negative for behavioral problems (Depression), sleep disturbance and suicidal ideas. The patient is not nervous/anxious.     Today's  Vitals   09/16/17 0839  BP: 130/80  Pulse: 74  Resp: 16  SpO2: 98%  Weight: 218 lb (98.9 kg)  Height: 5\' 10"  (1.778 m)    Physical Exam  Constitutional: He is oriented to person, place, and time. He appears well-developed and well-nourished. No distress.  HENT:  Head: Normocephalic and atraumatic.  Mouth/Throat: Oropharynx is clear and moist. No oropharyngeal exudate.  Eyes: Pupils are equal, round, and reactive to light. EOM are normal.  Neck: Normal range of motion. Neck supple. No JVD present. Carotid bruit is not present. No tracheal deviation present. No thyromegaly present.  Cardiovascular: Normal rate, regular rhythm and normal heart sounds. Exam reveals no gallop and no friction rub.  No murmur heard. Pulmonary/Chest: Effort normal and breath sounds normal. No respiratory distress. He has no wheezes. He has no rales. He exhibits no tenderness.  Abdominal: Soft. Bowel sounds are normal.  Musculoskeletal: Normal range of motion.  Moderate lower back pain, worse with bending and twisting at the waist. Worse with pushing, pulling, and lifting with the arms. No visible or  palpable abnormalities or deformities noted today.  Lymphadenopathy:    He has no cervical adenopathy.  Neurological: He is alert and oriented to person, place, and time. No cranial nerve deficit.  Skin: Skin is warm and dry. He is not diaphoretic.  Psychiatric: He has a normal mood and affect. His behavior is normal. Judgment and thought content normal.  Nursing note and vitals reviewed.   Assessment/Plan: 1. Essential (primary) hypertension Stable. Continue bp medication as prescribed.   2. Low back pain, unspecified back pain laterality, unspecified chronicity, with sciatica presence unspecified Meloxicam as previously prescribed. May take tramadol twice daily for mild to moderate pain. Hydrocodone/APAP 5/325mg  tablets may be taken as needed for more severe, breakthrough pain. Three 30 day prescriptions sent to pharmacy. Dates are 09/16/2017, 10/14/2017, and 11/12/2017 - traMADol (ULTRAM) 50 MG tablet; Take 1 tablet (50 mg total) by mouth 2 (two) times daily as needed.  Dispense: 60 tablet; Refill: 3 - HYDROcodone-acetaminophen (NORCO) 7.5-325 MG tablet; Take 1 tablet by mouth 2 (two) times daily as needed for moderate pain. To refill after 05/03/2017  Dispense: 60 tablet; Refill: 0  3. Gastro-esophageal reflux disease without esophagitis Continue ppi as previously prescribed.   4. Primary generalized (osteo)arthritis Continue meloxicam as prescribed. Tramadol and hydrocodone/APAP 5/325mg  may be taken as needed and as prescribed. Instructions reviewed with patient .  5. Mixed hyperlipidemia Continue cholesterol medication as prescribed   6. Nodular prostate without lower urinary tract symptoms Biopsy scheduled next Friday with urology  General Counseling: Roger Kill understanding of the findings of todays visit and agrees with plan of treatment. I have discussed any further diagnostic evaluation that may be needed or ordered today. We also reviewed his medications today. he has been  encouraged to call the office with any questions or concerns that should arise related to todays visit.    Counseling:  Reviewed risks and possible side effects associated with taking opiates and benzodiazepines. Combination of these could cause dizziness and drowsiness. Advised him not to drive or operate machinery when taking these medications, as he could put his life and the lives of others at risk. He voiced understanding.   This patient was seen by Leretha Pol, FNP- C in Collaboration with Dr Lavera Guise as a part of collaborative care agreement  Meds ordered this encounter  Medications  . DISCONTD: HYDROcodone-acetaminophen (NORCO) 7.5-325 MG tablet    Sig: Take  1 tablet by mouth 2 (two) times daily as needed for moderate pain. To refill after 05/03/2017    Dispense:  60 tablet    Refill:  0    Order Specific Question:   Supervising Provider    Answer:   Lavera Guise [[id number]]  . traMADol (ULTRAM) 50 MG tablet    Sig: Take 1 tablet (50 mg total) by mouth 2 (two) times daily as needed.    Dispense:  60 tablet    Refill:  3    Order Specific Question:   Supervising Provider    Answer:   Lavera Guise [[id number]]  . DISCONTD: HYDROcodone-acetaminophen (NORCO) 7.5-325 MG tablet    Sig: Take 1 tablet by mouth 2 (two) times daily as needed for moderate pain. To refill after 05/03/2017    Dispense:  60 tablet    Refill:  0    Fill after 10/14/2017    Order Specific Question:   Supervising Provider    Answer:   Lavera Guise [[id number]]  . HYDROcodone-acetaminophen (NORCO) 7.5-325 MG tablet    Sig: Take 1 tablet by mouth 2 (two) times daily as needed for moderate pain. To refill after 05/03/2017    Dispense:  60 tablet    Refill:  0    Fill after 11/12/2017    Order Specific Question:   Supervising Provider    Answer:   Lavera Guise [[id number]]    Time spent: 27 Minutes     Dr Lavera Guise Internal medicine

## 2017-10-03 DIAGNOSIS — R972 Elevated prostate specific antigen [PSA]: Secondary | ICD-10-CM | POA: Diagnosis not present

## 2017-12-09 ENCOUNTER — Ambulatory Visit (INDEPENDENT_AMBULATORY_CARE_PROVIDER_SITE_OTHER): Payer: Medicare Other | Admitting: Nurse Practitioner

## 2017-12-09 ENCOUNTER — Encounter: Payer: Self-pay | Admitting: Nurse Practitioner

## 2017-12-09 VITALS — BP 113/70 | HR 103 | Resp 16 | Ht 70.0 in | Wt 215.4 lb

## 2017-12-09 DIAGNOSIS — I10 Essential (primary) hypertension: Secondary | ICD-10-CM | POA: Diagnosis not present

## 2017-12-09 DIAGNOSIS — M545 Low back pain: Secondary | ICD-10-CM

## 2017-12-09 DIAGNOSIS — Z0001 Encounter for general adult medical examination with abnormal findings: Secondary | ICD-10-CM

## 2017-12-09 DIAGNOSIS — K219 Gastro-esophageal reflux disease without esophagitis: Secondary | ICD-10-CM | POA: Diagnosis not present

## 2017-12-09 DIAGNOSIS — E782 Mixed hyperlipidemia: Secondary | ICD-10-CM | POA: Diagnosis not present

## 2017-12-09 MED ORDER — ROSUVASTATIN CALCIUM 20 MG PO TABS: 20.0000 mg | ORAL_TABLET | Freq: Every day | ORAL | 5 refills | Status: DC

## 2017-12-09 MED ORDER — PANTOPRAZOLE SODIUM 40 MG PO TBEC: 40.0000 mg | DELAYED_RELEASE_TABLET | Freq: Every day | ORAL | 5 refills | Status: DC

## 2017-12-09 MED ORDER — HYDROCODONE-ACETAMINOPHEN 7.5-325 MG PO TABS: 1.0000 | ORAL_TABLET | Freq: Two times a day (BID) | ORAL | 0 refills | Status: DC | PRN

## 2017-12-09 MED ORDER — TRAMADOL HCL 50 MG PO TABS: 50.0000 mg | ORAL_TABLET | Freq: Two times a day (BID) | ORAL | 3 refills | Status: DC | PRN

## 2017-12-09 MED ORDER — MELOXICAM 15 MG PO TABS: 15.0000 mg | ORAL_TABLET | Freq: Every day | ORAL | 5 refills | Status: DC | PRN

## 2017-12-09 MED ORDER — BENAZEPRIL-HYDROCHLOROTHIAZIDE 20-25 MG PO TABS: 1.0000 | ORAL_TABLET | Freq: Every day | ORAL | 5 refills | Status: DC

## 2017-12-09 NOTE — Progress Notes (Signed)
[facility] [place], [place] [address]  Internal MEDICINE  Office Visit Note  Patient Name: Russell Sullivan  [id number]  [id number]  Date of Service: 12/09/2017   Pt is here for routine health maintenance examination  Chief Complaint  Patient presents with  . Annual Exam    3 month wellness visit  . Hypertension  . Medical Management of Chronic Issues    medication refills     Hid back pain is chronic in nature. He is physically active. states that his back will completely "go out" on him when he is working. has been taking tylenol arthritis 650mg  tablets in addition to tramadol and hydrocodone due to back and leg pain being worse since weather has been cold and damp. will take a tramadol 50mg  tablets when pain gets to be 6/10 in severity. Will take hydrocodone/APAP 7.5/325mg  twice daily, when pain reaches 8/10 in severity. Brings his pain down to 4/10 in severity. Helps to keep him active and allows him to participate in ADLs. He will take his tramadol in between the doses of hydrocodone so an extra dose of the hydrocodone is not needed. is having increased nocturia along with hesitancy.   Back Pain  This is a chronic problem. The current episode started more than 1 year ago. The problem occurs daily. The problem is unchanged. The pain is present in the lumbar spine and sacro-iliac. The quality of the pain is described as aching. The pain is moderate. The pain is worse during the night. The symptoms are aggravated by stress and standing. Stiffness is present at night. Associated symptoms include leg pain. Pertinent negatives include no abdominal pain, chest pain, dysuria, headaches or numbness. He has tried analgesics, muscle relaxant and NSAIDs for the symptoms. The treatment provided moderate relief.     Current Medication: Outpatient Encounter Medications as of 12/09/2017  Medication Sig  . aspirin 81 MG tablet Take 81 mg by mouth as needed for pain.  Marland Kitchen  HYDROcodone-acetaminophen (NORCO) 7.5-325 MG tablet Take 1 tablet by mouth 2 (two) times daily as needed for moderate pain. To refill after 05/03/2017  . meloxicam (MOBIC) 15 MG tablet Take 1 tablet (15 mg total) by mouth daily as needed (for arthritis).  . pantoprazole (PROTONIX) 40 MG tablet Take 1 tablet (40 mg total) by mouth daily.  . rosuvastatin (CRESTOR) 20 MG tablet Take 1 tablet (20 mg total) by mouth at bedtime.  . tamsulosin (FLOMAX) 0.4 MG CAPS capsule Take 1 capsule (0.4 mg total) by mouth daily.  . traMADol (ULTRAM) 50 MG tablet Take 1 tablet (50 mg total) by mouth 2 (two) times daily as needed.  . [DISCONTINUED] HYDROcodone-acetaminophen (NORCO) 7.5-325 MG tablet Take 1 tablet by mouth 2 (two) times daily as needed for moderate pain. To refill after 05/03/2017  . [DISCONTINUED] HYDROcodone-acetaminophen (NORCO) 7.5-325 MG tablet Take 1 tablet by mouth 2 (two) times daily as needed for moderate pain. To refill after 05/03/2017  . [DISCONTINUED] HYDROcodone-acetaminophen (NORCO) 7.5-325 MG tablet Take 1 tablet by mouth 2 (two) times daily as needed for moderate pain. To refill after 05/03/2017  . [DISCONTINUED] meloxicam (MOBIC) 15 MG tablet Take 1 tablet (15 mg total) by mouth daily as needed (for arthritis).  . [DISCONTINUED] pantoprazole (PROTONIX) 40 MG tablet Take 1 tablet (40 mg total) by mouth daily.  . [DISCONTINUED] rosuvastatin (CRESTOR) 20 MG tablet Take 1 tablet (20 mg total) by mouth at bedtime.  . [DISCONTINUED] traMADol (ULTRAM) 50 MG tablet Take 1  tablet (50 mg total) by mouth 2 (two) times daily as needed.  . benazepril-hydrochlorthiazide (LOTENSIN HCT) 20-25 MG tablet Take 1 tablet by mouth daily.  . fluticasone (FLONASE) 50 MCG/ACT nasal spray Place 2 sprays into both nostrils daily.  . sildenafil (REVATIO) 20 MG tablet Take 20 mg by mouth as needed.  . triamcinolone cream (KENALOG) 0.1 % Apply 1 application topically as needed.  . vardenafil (LEVITRA) 20 MG tablet  Take 20 mg by mouth as needed for erectile dysfunction.  . [DISCONTINUED] benazepril-hydrochlorthiazide (LOTENSIN HCT) 20-25 MG tablet Take 1 tablet by mouth daily. (Patient not taking: Reported on 12/09/2017)   No facility-administered encounter medications on file as of 12/09/2017.     Surgical History: Past Surgical History:  Procedure Laterality Date  . HERNIA REPAIR  2009  . left shoulder surgery      Medical History: Past Medical History:  Diagnosis Date  . Hyperlipidemia   . Hypertension     Family History: Family History  Problem Relation Age of Onset  . Hypertension Mother   . Diabetes Mother   . Hypertension Father   . Diabetes Maternal Grandmother   . Diabetes Paternal Grandmother       Review of Systems  Constitutional: Negative for activity change, chills, fatigue and unexpected weight change.  HENT: Negative for congestion, postnasal drip, rhinorrhea, sneezing and sore throat.   Eyes: Negative.  Negative for redness.  Respiratory: Negative for cough, chest tightness and shortness of breath.   Cardiovascular: Negative for chest pain and palpitations.  Gastrointestinal: Negative for abdominal pain, constipation, diarrhea, nausea and vomiting.  Endocrine: Negative for cold intolerance, heat intolerance, polydipsia, polyphagia and polyuria.  Genitourinary: Positive for frequency and urgency. Negative for dysuria.       Getting up 2 or 3 times per night to use the bathroom. Has seen urology since his last visit. Had several biopsies of prostate which were all benign.   Musculoskeletal: Positive for back pain and myalgias. Negative for arthralgias, joint swelling and neck pain.  Skin: Negative for rash.  Allergic/Immunologic: Negative for environmental allergies.  Neurological: Negative for tremors, numbness and headaches.  Hematological: Negative for adenopathy. Does not bruise/bleed easily.  Psychiatric/Behavioral: Negative for behavioral problems (Depression),  sleep disturbance and suicidal ideas. The patient is not nervous/anxious.      Today's Vitals   12/09/17 0925  BP: 113/70  Pulse: (!) 103  Resp: 16  SpO2: 97%  Weight: 215 lb 6.4 oz (97.7 kg)  Height: 5\' 10"  (1.778 m)    Physical Exam  Constitutional: He is oriented to person, place, and time. He appears well-developed and well-nourished. No distress.  HENT:  Head: Normocephalic and atraumatic.  Nose: Nose normal.  Mouth/Throat: Oropharynx is clear and moist. No oropharyngeal exudate.  Eyes: Pupils are equal, round, and reactive to light. Conjunctivae and EOM are normal.  Neck: Normal range of motion. Neck supple. No JVD present. Carotid bruit is not present. No tracheal deviation present. No thyromegaly present.  Cardiovascular: Normal rate, regular rhythm, normal heart sounds and intact distal pulses. Exam reveals no gallop and no friction rub.  No murmur heard. Pulmonary/Chest: Effort normal and breath sounds normal. No respiratory distress. He has no wheezes. He has no rales. He exhibits no tenderness.  Abdominal: Soft. Bowel sounds are normal. There is no tenderness. There is no guarding.  Musculoskeletal: Normal range of motion.  Moderate lower back pain, worse with bending and twisting at the waist. Worse with pushing, pulling, and lifting with  the arms. No visible or palpable abnormalities or deformities noted today.  Lymphadenopathy:    He has no cervical adenopathy.  Neurological: He is alert and oriented to person, place, and time. No cranial nerve deficit.  Skin: Skin is warm and dry. Capillary refill takes less than 2 seconds. He is not diaphoretic.  Psychiatric: He has a normal mood and affect. His behavior is normal. Judgment and thought content normal.  Nursing note and vitals reviewed.  Assessment/Plan: 1. Encounter for general adult medical examination with abnormal findings Annual wellness visit today - CBC with Differential/Platelet - Comprehensive metabolic  panel - Lipid panel - TSH  2. Essential (primary) hypertension Stable.  - benazepril-hydrochlorthiazide (LOTENSIN HCT) 20-25 MG tablet; Take 1 tablet by mouth daily.  Dispense: 30 tablet; Refill: 5 - CBC with Differential/Platelet - Comprehensive metabolic panel  3. Low back pain, unspecified back pain laterality, unspecified chronicity, with sciatica presence unspecified Meloxicam 15mg  daily to reduce pain/inflammation. Tramadol may be taken twice daily as needed for mild to moderate pain. May continue hydrocodone/APAP 7.5/325mg  up to twice daily when needed for more severe pain. Reviewed risks and side effects associated with taking narcotic pain relievers. Three 30 day prescriptions were sent to his pharmacy. Dates are 12/09/2017, 01/07/2018, and 02/02/2018.  - traMADol (ULTRAM) 50 MG tablet; Take 1 tablet (50 mg total) by mouth 2 (two) times daily as needed.  Dispense: 60 tablet; Refill: 3 - meloxicam (MOBIC) 15 MG tablet; Take 1 tablet (15 mg total) by mouth daily as needed (for arthritis).  Dispense: 30 tablet; Refill: 5 - HYDROcodone-acetaminophen (NORCO) 7.5-325 MG tablet; Take 1 tablet by mouth 2 (two) times daily as needed for moderate pain. To refill after 05/03/2017  Dispense: 60 tablet; Refill: 0  4. Gastro-esophageal reflux disease without esophagitis Stable.  - pantoprazole (PROTONIX) 40 MG tablet; Take 1 tablet (40 mg total) by mouth daily.  Dispense: 30 tablet; Refill: 5  5. Mixed hyperlipidemia Check fasting lipid panel and adjust crestor as indicated.  - rosuvastatin (CRESTOR) 20 MG tablet; Take 1 tablet (20 mg total) by mouth at bedtime.  Dispense: 30 tablet; Refill: 5 - Lipid panel  General Counseling: Aaden verbalizes understanding of the findings of todays visit and agrees with plan of treatment. I have discussed any further diagnostic evaluation that may be needed or ordered today. We also reviewed his medications today. he has been encouraged to call the office with  any questions or concerns that should arise related to todays visit.    Counseling:  Hypertension Counseling:   The following hypertensive lifestyle modification were recommended and discussed:  1. Limiting alcohol intake to less than 1 oz/day of ethanol:(24 oz of beer or 8 oz of wine or 2 oz of 100-proof whiskey). 2. Take baby ASA 81 mg daily. 3. Importance of regular aerobic exercise and losing weight. 4. Reduce dietary saturated fat and cholesterol intake for overall cardiovascular health. 5. Maintaining adequate dietary potassium, calcium, and magnesium intake. 6. Regular monitoring of the blood pressure. 7. Reduce sodium intake to less than 100 mmol/day (less than 2.3 gm of sodium or less than 6 gm of sodium choride)   Reviewed risks and possible side effects associated with taking opiates and benzodiazepines. Combination of these could cause dizziness and drowsiness. Advised patient not to drive or operate machinery when taking these medications, as patient's and other's life can be at risk and will have consequences. Patient verbalized understanding in this matter.   This patient was seen by Leretha Pol  FNP Collaboration with Dr Lavera Guise as a part of collaborative care agreement  Orders Placed This Encounter  Procedures  . CBC with Differential/Platelet  . Comprehensive metabolic panel  . Lipid panel  . TSH    Meds ordered this encounter  Medications  . benazepril-hydrochlorthiazide (LOTENSIN HCT) 20-25 MG tablet    Sig: Take 1 tablet by mouth daily.    Dispense:  30 tablet    Refill:  5    Order Specific Question:   Supervising Provider    Answer:   Lavera Guise [[id number]]  . DISCONTD: HYDROcodone-acetaminophen (NORCO) 7.5-325 MG tablet    Sig: Take 1 tablet by mouth 2 (two) times daily as needed for moderate pain. To refill after 05/03/2017    Dispense:  60 tablet    Refill:  0    Order Specific Question:   Supervising Provider    Answer:   Lavera Guise [[id number]]   . traMADol (ULTRAM) 50 MG tablet    Sig: Take 1 tablet (50 mg total) by mouth 2 (two) times daily as needed.    Dispense:  60 tablet    Refill:  3    Order Specific Question:   Supervising Provider    Answer:   Lavera Guise [[id number]]  . meloxicam (MOBIC) 15 MG tablet    Sig: Take 1 tablet (15 mg total) by mouth daily as needed (for arthritis).    Dispense:  30 tablet    Refill:  5    Order Specific Question:   Supervising Provider    Answer:   Lavera Guise [[id number]]  . pantoprazole (PROTONIX) 40 MG tablet    Sig: Take 1 tablet (40 mg total) by mouth daily.    Dispense:  30 tablet    Refill:  5    Order Specific Question:   Supervising Provider    Answer:   Lavera Guise [[id number]]  . rosuvastatin (CRESTOR) 20 MG tablet    Sig: Take 1 tablet (20 mg total) by mouth at bedtime.    Dispense:  30 tablet    Refill:  5    Order Specific Question:   Supervising Provider    Answer:   Lavera Guise [[id number]]  . DISCONTD: HYDROcodone-acetaminophen (NORCO) 7.5-325 MG tablet    Sig: Take 1 tablet by mouth 2 (two) times daily as needed for moderate pain. To refill after 05/03/2017    Dispense:  60 tablet    Refill:  0    Fill after 01/07/2018    Order Specific Question:   Supervising Provider    Answer:   Lavera Guise [[id number]]  . HYDROcodone-acetaminophen (NORCO) 7.5-325 MG tablet    Sig: Take 1 tablet by mouth 2 (two) times daily as needed for moderate pain. To refill after 05/03/2017    Dispense:  60 tablet    Refill:  0    Fill after 02/04/2018    Order Specific Question:   Supervising Provider    Answer:   Lavera Guise [[id number]]    Time spent: [place], MD  Internal Medicine

## 2017-12-14 DIAGNOSIS — R972 Elevated prostate specific antigen [PSA]: Secondary | ICD-10-CM | POA: Diagnosis not present

## 2017-12-14 DIAGNOSIS — Z7982 Long term (current) use of aspirin: Secondary | ICD-10-CM | POA: Diagnosis not present

## 2017-12-14 DIAGNOSIS — N529 Male erectile dysfunction, unspecified: Secondary | ICD-10-CM | POA: Diagnosis not present

## 2017-12-14 DIAGNOSIS — Z8042 Family history of malignant neoplasm of prostate: Secondary | ICD-10-CM | POA: Diagnosis not present

## 2017-12-30 ENCOUNTER — Encounter: Payer: Self-pay | Admitting: Nurse Practitioner

## 2018-01-13 ENCOUNTER — Other Ambulatory Visit: Payer: Self-pay

## 2018-01-13 ENCOUNTER — Encounter
Admission: EM | Discharge status: Against Medical Advice | Payer: Self-pay | Source: Home / Self Care | Attending: Internal Medicine

## 2018-01-13 ENCOUNTER — Inpatient Hospital Stay
Admission: EM | Admit: 2018-01-13 | Discharge: 2018-01-15 | DRG: 916 | Discharge status: Against Medical Advice | Payer: Medicare Other | Attending: Internal Medicine | Admitting: Internal Medicine

## 2018-01-13 ENCOUNTER — Emergency Department: Payer: Medicare Other | Admitting: Certified Registered"

## 2018-01-13 DIAGNOSIS — E785 Hyperlipidemia, unspecified: Secondary | ICD-10-CM | POA: Diagnosis not present

## 2018-01-13 DIAGNOSIS — Z791 Long term (current) use of non-steroidal anti-inflammatories (NSAID): Secondary | ICD-10-CM | POA: Diagnosis not present

## 2018-01-13 DIAGNOSIS — K219 Gastro-esophageal reflux disease without esophagitis: Secondary | ICD-10-CM | POA: Diagnosis not present

## 2018-01-13 DIAGNOSIS — T783XXA Angioneurotic edema, initial encounter: Secondary | ICD-10-CM | POA: Diagnosis not present

## 2018-01-13 DIAGNOSIS — J9690 Respiratory failure, unspecified, unspecified whether with hypoxia or hypercapnia: Secondary | ICD-10-CM

## 2018-01-13 DIAGNOSIS — E782 Mixed hyperlipidemia: Secondary | ICD-10-CM | POA: Diagnosis present

## 2018-01-13 DIAGNOSIS — Z833 Family history of diabetes mellitus: Secondary | ICD-10-CM

## 2018-01-13 DIAGNOSIS — Z7982 Long term (current) use of aspirin: Secondary | ICD-10-CM

## 2018-01-13 DIAGNOSIS — I10 Essential (primary) hypertension: Secondary | ICD-10-CM | POA: Diagnosis not present

## 2018-01-13 DIAGNOSIS — Z8249 Family history of ischemic heart disease and other diseases of the circulatory system: Secondary | ICD-10-CM

## 2018-01-13 DIAGNOSIS — E876 Hypokalemia: Secondary | ICD-10-CM | POA: Diagnosis present

## 2018-01-13 DIAGNOSIS — R0602 Shortness of breath: Secondary | ICD-10-CM

## 2018-01-13 DIAGNOSIS — M1991 Primary osteoarthritis, unspecified site: Secondary | ICD-10-CM | POA: Diagnosis present

## 2018-01-13 DIAGNOSIS — E1169 Type 2 diabetes mellitus with other specified complication: Secondary | ICD-10-CM | POA: Diagnosis present

## 2018-01-13 DIAGNOSIS — T464X5A Adverse effect of angiotensin-converting-enzyme inhibitors, initial encounter: Secondary | ICD-10-CM | POA: Diagnosis present

## 2018-01-13 DIAGNOSIS — E1159 Type 2 diabetes mellitus with other circulatory complications: Secondary | ICD-10-CM | POA: Diagnosis present

## 2018-01-13 DIAGNOSIS — J9811 Atelectasis: Secondary | ICD-10-CM | POA: Diagnosis not present

## 2018-01-13 HISTORY — PX: INTUBATION-ENDOTRACHEAL WITH TRACHEOSTOMY STANDBY: SHX6592

## 2018-01-13 SURGERY — INTUBATION-ENDOTRACHEAL WITH TRACHEOSTOMY STANDBY
Anesthesia: General | Site: Mouth

## 2018-01-13 MED ORDER — PROPOFOL 10 MG/ML IV BOLUS
INTRAVENOUS | Status: AC
  Filled 2018-01-13: qty 20

## 2018-01-13 MED ORDER — FENTANYL CITRATE (PF) 100 MCG/2ML IJ SOLN: 25.0000 ug | INTRAMUSCULAR | Status: DC | PRN

## 2018-01-13 MED ORDER — METHYLPREDNISOLONE SODIUM SUCC 125 MG IJ SOLR
INTRAMUSCULAR | Status: AC
  Filled 2018-01-13: qty 2

## 2018-01-13 MED ORDER — FAMOTIDINE IN NACL 20-0.9 MG/50ML-% IV SOLN
INTRAVENOUS | Status: AC
  Filled 2018-01-13: qty 50

## 2018-01-13 MED ORDER — DIPHENHYDRAMINE HCL 50 MG/ML IJ SOLN
INTRAMUSCULAR | Status: AC
  Filled 2018-01-13: qty 1

## 2018-01-13 MED ORDER — LIDOCAINE HCL (PF) 2 % IJ SOLN
INTRAMUSCULAR | Status: AC
  Filled 2018-01-13: qty 10

## 2018-01-13 MED ORDER — MIDAZOLAM HCL 2 MG/2ML IJ SOLN
INTRAMUSCULAR | Status: AC
  Filled 2018-01-13: qty 4

## 2018-01-13 MED ORDER — MIDAZOLAM HCL 2 MG/2ML IJ SOLN: 2.0000 mg | Freq: Once | INTRAMUSCULAR | Status: DC

## 2018-01-13 MED ORDER — LIDOCAINE HCL (CARDIAC) PF 100 MG/5ML IV SOSY
PREFILLED_SYRINGE | INTRAVENOUS | Status: DC | PRN
  Administered 2018-01-13: 100 mg via INTRAVENOUS

## 2018-01-13 MED ORDER — MIDAZOLAM HCL 2 MG/2ML IJ SOLN
INTRAMUSCULAR | Status: AC
  Filled 2018-01-13: qty 2

## 2018-01-13 MED ORDER — LACTATED RINGERS IV SOLN
INTRAVENOUS | Status: DC | PRN
  Administered 2018-01-13: 23:00:00 via INTRAVENOUS

## 2018-01-13 MED ORDER — SUCCINYLCHOLINE CHLORIDE 20 MG/ML IJ SOLN
INTRAMUSCULAR | Status: DC | PRN
  Administered 2018-01-13: 100 mg via INTRAVENOUS

## 2018-01-13 MED ORDER — SUCCINYLCHOLINE CHLORIDE 20 MG/ML IJ SOLN
INTRAMUSCULAR | Status: AC
  Filled 2018-01-13: qty 1

## 2018-01-13 MED ORDER — EPINEPHRINE 0.3 MG/0.3ML IJ SOAJ
INTRAMUSCULAR | Status: AC
  Filled 2018-01-13: qty 0.3

## 2018-01-13 MED ORDER — ONDANSETRON HCL 4 MG/2ML IJ SOLN: 4.0000 mg | Freq: Once | INTRAMUSCULAR | Status: DC | PRN

## 2018-01-13 MED ORDER — PROPOFOL 10 MG/ML IV BOLUS
INTRAVENOUS | Status: DC | PRN
  Administered 2018-01-13: 160 mg via INTRAVENOUS

## 2018-01-13 MED ORDER — OXYMETAZOLINE HCL 0.05 % NA SOLN
NASAL | Status: AC
  Filled 2018-01-13: qty 15

## 2018-01-13 MED ORDER — ROCURONIUM BROMIDE 50 MG/5ML IV SOLN
INTRAVENOUS | Status: AC
  Filled 2018-01-13: qty 1

## 2018-01-13 MED ORDER — HEPARIN SODIUM (PORCINE) 5000 UNIT/ML IJ SOLN
5000.0000 [IU] | Freq: Three times a day (TID) | INTRAMUSCULAR | Status: DC
  Administered 2018-01-14 – 2018-01-15 (×4): 5000 [IU] via SUBCUTANEOUS
  Filled 2018-01-13 (×4): qty 1

## 2018-01-13 MED ORDER — DIPHENHYDRAMINE HCL 50 MG/ML IJ SOLN
50.0000 mg | Freq: Once | INTRAMUSCULAR | Status: AC
  Administered 2018-01-13: 50 mg via INTRAVENOUS

## 2018-01-13 MED ORDER — SODIUM CHLORIDE 0.9 % IV BOLUS
1000.0000 mL | Freq: Once | INTRAVENOUS | Status: AC
  Administered 2018-01-13: 1000 mL via INTRAVENOUS

## 2018-01-13 MED ORDER — METHYLPREDNISOLONE SODIUM SUCC 125 MG IJ SOLR
125.0000 mg | Freq: Once | INTRAMUSCULAR | Status: AC
  Administered 2018-01-13: 125 mg via INTRAVENOUS

## 2018-01-13 MED ORDER — FAMOTIDINE IN NACL 20-0.9 MG/50ML-% IV SOLN
20.0000 mg | Freq: Once | INTRAVENOUS | Status: AC
  Administered 2018-01-13: 20 mg via INTRAVENOUS

## 2018-01-13 MED ORDER — MIDAZOLAM HCL 2 MG/2ML IJ SOLN
INTRAMUSCULAR | Status: DC | PRN
  Administered 2018-01-13: 4 mg via INTRAVENOUS

## 2018-01-13 MED ORDER — DEXTROSE-NACL 5-0.45 % IV SOLN
INTRAVENOUS | Status: DC
  Administered 2018-01-14 – 2018-01-15 (×2): via INTRAVENOUS

## 2018-01-13 MED ORDER — EPINEPHRINE 0.3 MG/0.3ML IJ SOAJ
0.3000 mg | Freq: Once | INTRAMUSCULAR | Status: AC
  Administered 2018-01-13: 0.3 mg via INTRAMUSCULAR

## 2018-01-13 MED ORDER — OXYMETAZOLINE HCL 0.05 % NA SOLN
1.0000 | Freq: Once | NASAL | Status: AC
  Administered 2018-01-13: 1 via NASAL

## 2018-01-13 SURGICAL SUPPLY — 13 items
BLADE SURG 15 STRL LF DISP TIS (BLADE) ×1 IMPLANT
BLADE SURG 15 STRL SS (BLADE) ×2
BLADE SURG SZ11 CARB STEEL (BLADE) ×3 IMPLANT
CANISTER SUCT 1200ML W/VALVE (MISCELLANEOUS) ×3 IMPLANT
ELECT REM PT RETURN 9FT ADLT (ELECTROSURGICAL) ×3
ELECTRODE REM PT RTRN 9FT ADLT (ELECTROSURGICAL) ×1 IMPLANT
GLOVE BIO SURGEON STRL SZ7.5 (GLOVE) ×3 IMPLANT
GOWN STRL REUS W/ TWL LRG LVL3 (GOWN DISPOSABLE) ×2 IMPLANT
GOWN STRL REUS W/TWL LRG LVL3 (GOWN DISPOSABLE) ×4
KIT TURNOVER KIT A (KITS) ×3 IMPLANT
PACK HEAD/NECK (MISCELLANEOUS) ×3 IMPLANT
SLEEVE SCD COMPRESS THIGH MED (MISCELLANEOUS) ×3 IMPLANT
SPONGE DRAIN TRACH 4X4 STRL 2S (GAUZE/BANDAGES/DRESSINGS) ×3 IMPLANT

## 2018-01-13 NOTE — ED Notes (Signed)
Pt to or with this RN.

## 2018-01-13 NOTE — Anesthesia Preprocedure Evaluation (Signed)
Anesthesia Evaluation  Patient identified by MRN, date of birth, ID band Patient awake    Reviewed: Allergy & Precautions, NPO status , Patient's Chart, lab work & pertinent test results  Airway Mallampati: III  TM Distance: <3 FB     Dental  (+) Chipped, Caps   Pulmonary neg pulmonary ROS,    Pulmonary exam normal        Cardiovascular hypertension, Normal cardiovascular exam     Neuro/Psych negative psych ROS   GI/Hepatic Neg liver ROS, GERD  Medicated,  Endo/Other  negative endocrine ROS  Renal/GU negative Renal ROS  negative genitourinary   Musculoskeletal  (+) Arthritis , Osteoarthritis,    Abdominal Normal abdominal exam  (+)   Peds negative pediatric ROS (+)  Hematology negative hematology ROS (+)   Anesthesia Other Findings   Reproductive/Obstetrics                             Anesthesia Physical Anesthesia Plan  ASA: II and emergent  Anesthesia Plan: General   Post-op Pain Management:    Induction: Intravenous, Cricoid pressure planned and Rapid sequence  PONV Risk Score and Plan:   Airway Management Planned: Oral ETT  Additional Equipment:   Intra-op Plan:   Post-operative Plan: Extubation in OR  Informed Consent: I have reviewed the patients History and Physical, chart, labs and discussed the procedure including the risks, benefits and alternatives for the proposed anesthesia with the patient or authorized representative who has indicated his/her understanding and acceptance.   Dental advisory given  Plan Discussed with: CRNA and Surgeon  Anesthesia Plan Comments:         Anesthesia Quick Evaluation

## 2018-01-13 NOTE — Op Note (Signed)
01/13/2018  11:52 PM    Russell Sullivan  [id number]   Pre-Op Dx: angioedema  Post-op Dx: SAME  Proc: Oral intubation  Surg:  Roena Malady  Anes:  GOT  EBL: None  Comp: None  Findings: Due to edema of the floor mouth and tongue  Procedure: Russell Sullivan was taken from the emergency room to the operating room.  After IV sedation and induction patient was intubated orally with a glide scope with a 7-0 endotracheal tube without difficulty.  There was no significant angioedema of the larynx.  Once intubated and the endotracheal tube secured the patient was transported to the recovery room and then to the ICU in stable condition.  Dispo:   Good  Plan: Angioedema with tongue swelling will remain intubated until the floor mouth and tongue swelling has difficulty decreased the patient will be admitted to the ICU on the hospitalist service.  Roena Malady  01/13/2018 11:52 PM

## 2018-01-13 NOTE — H&P (Signed)
[facility]    MR#:  [id number]  DATE OF BIRTH:  07-06-1950  DATE OF ADMISSION:  01/13/2018  PRIMARY CARE PHYSICIAN: Ronnell Freshwater, NP   REQUESTING/REFERRING PHYSICIAN: Tami Ribas, MD  CHIEF COMPLAINT:   Chief Complaint  Patient presents with  . Allergic Reaction    HISTORY OF PRESENT ILLNESS:  Russell Sullivan  is a 67 y.o. male who presents with chief complaint as above.  Patient presented to the ED with a chief complaint of tongue swelling.  He states that he has some shrimp tonight, but he is eaten shrimp multiple times all throughout his life and never had any issue.  He is on an ACE inhibitor for hypertension.  Patient's swelling progressed in the ED and he required intubation by anesthesia service in the OR.  He was then moved to the ICU and hospitalist were called for admission  PAST MEDICAL HISTORY:   Past Medical History:  Diagnosis Date  . Hyperlipidemia   . Hypertension      PAST SURGICAL HISTORY:   Past Surgical History:  Procedure Laterality Date  . HERNIA REPAIR  2009  . left shoulder surgery       SOCIAL HISTORY:   Social History   Tobacco Use  . Smoking status: Never Smoker  . Smokeless tobacco: Never Used  Substance Use Topics  . Alcohol use: No    Frequency: Never     FAMILY HISTORY:   Family History  Problem Relation Age of Onset  . Hypertension Mother   . Diabetes Mother   . Hypertension Father   . Diabetes Maternal Grandmother   . Diabetes Paternal Grandmother      DRUG ALLERGIES:  No Known Allergies  MEDICATIONS AT HOME:   Prior to Admission medications   Medication Sig Start Date End Date Taking? Authorizing Provider  benazepril-hydrochlorthiazide (LOTENSIN HCT) 20-25 MG tablet Take 1 tablet by mouth daily. 12/09/17  Yes Boscia, Greer Ee, NP  HYDROcodone-acetaminophen (NORCO) 7.5-325 MG tablet Take 1 tablet by mouth 2 (two) times daily as needed  for moderate pain. To refill after 05/03/2017 12/09/17  Yes Boscia, Greer Ee, NP  meloxicam (MOBIC) 15 MG tablet Take 1 tablet (15 mg total) by mouth daily as needed (for arthritis). 12/09/17  Yes Boscia, Greer Ee, NP  rosuvastatin (CRESTOR) 20 MG tablet Take 1 tablet (20 mg total) by mouth at bedtime. 12/09/17  Yes Boscia, Greer Ee, NP  traMADol (ULTRAM) 50 MG tablet Take 1 tablet (50 mg total) by mouth 2 (two) times daily as needed. 12/09/17  Yes Ronnell Freshwater, NP  aspirin 81 MG tablet Take 81 mg by mouth as needed for pain.    [provider]  vardenafil (LEVITRA) 20 MG tablet Take 20 mg by mouth as needed for erectile dysfunction.    [provider]    REVIEW OF SYSTEMS:  Review of Systems  Unable to perform ROS: Critical illness     VITAL SIGNS:   Vitals:   01/13/18 2230 01/13/18 2245 01/13/18 2256 01/13/18 2344  BP: (!) 180/70  (!) 173/70 (P) 110/66  Pulse: 83 85 77 (P) 74  Resp: 13 15 16  (!) (P) 30  Temp:    (!) (P) 97.2 F (36.2 C)  TempSrc:      SpO2: 100% 99% 99%   Weight:      Height:       Wt Readings from Last 3 Encounters:  01/13/18 96.2 kg  12/09/17 97.7 kg  09/16/17 98.9 kg    PHYSICAL EXAMINATION:  Physical Exam  Vitals reviewed. Constitutional: He is oriented to person, place, and time. He appears well-developed and well-nourished. No distress.  HENT:  Head: Normocephalic and atraumatic.  Mouth/Throat: Oropharynx is clear and moist.  Angioedema of tongue and lips  Eyes: Pupils are equal, round, and reactive to light. Conjunctivae and EOM are normal. No scleral icterus.  Neck: Normal range of motion. Neck supple. No JVD present. No thyromegaly present.  Cardiovascular: Normal rate, regular rhythm and intact distal pulses. Exam reveals no gallop and no friction rub.  No murmur heard. Respiratory: Effort normal and breath sounds normal. No respiratory distress. He has no wheezes. He has no rales.  Intubated and mechanically ventilated   GI: Soft. Bowel sounds are normal. He exhibits no distension. There is no tenderness.  Musculoskeletal: Normal range of motion. He exhibits no edema.  No arthritis, no gout  Lymphadenopathy:    He has no cervical adenopathy.  Neurological: He is alert and oriented to person, place, and time. No cranial nerve deficit.  No dysarthria, no aphasia  Skin: Skin is warm and dry. No rash noted. No erythema.  Psychiatric: He has a normal mood and affect. His behavior is normal. Judgment and thought content normal.    LABORATORY PANEL:   CBC No results for input(s): WBC, HGB, HCT, PLT in the last 168 hours. ------------------------------------------------------------------------------------------------------------------  Chemistries  No results for input(s): NA, K, CL, CO2, GLUCOSE, BUN, CREATININE, CALCIUM, MG, AST, ALT, ALKPHOS, BILITOT in the last 168 hours.  Invalid input(s): GFRCGP ------------------------------------------------------------------------------------------------------------------  Cardiac Enzymes No results for input(s): TROPONINI in the last 168 hours. ------------------------------------------------------------------------------------------------------------------  RADIOLOGY:  No results found.  EKG:   Orders placed or performed during the hospital encounter of 01/13/18  . EKG 12-Lead  . EKG 12-Lead    IMPRESSION AND PLAN:  Principal Problem:   Angioedema -patient was given Solu-Medrol and other adjunctive treatment for angioedema in the ED, then taken and intubated by anesthesia in the OR.  Monitor for improvement of angioedema.  Intensivist consult. Active Problems:   Essential (primary) hypertension -patient is n.p.o. while intubated, PRN antihypertensives, avoid all ACE inhibitors   Gastro-esophageal reflux disease without esophagitis -H2 blocker while intubated   Mixed hyperlipidemia -restart home dose antilipid once he is able to take p.o.  Chart  review performed and case discussed with ED provider. Labs, imaging and/or ECG reviewed by provider and discussed with patient/family. Management plans discussed with the patient and/or family.  DVT PROPHYLAXIS: SubQ heparin  GI PROPHYLAXIS:  H2 blocker  ADMISSION STATUS: Inpatient     CODE STATUS: Full    Code Status Orders  (From admission, onward)         Start     Ordered   01/13/18 2330  Full code  Continuous     01/13/18 2346        Code Status History    This patient has a current code status but no historical code status.      TOTAL CRITICAL CARE TIME TAKING CARE OF THIS PATIENT: 50 minutes.   Russell Sullivan [place] 01/13/2018, 11:53 PM  [employer]  [phone]  CC: Primary care physician; Ronnell Freshwater, NP  Note:  This document was prepared using Dragon voice recognition software and may include unintentional dictation errors.

## 2018-01-13 NOTE — ED Notes (Signed)
md discussing intubation in OR vs ED with pt.

## 2018-01-13 NOTE — Transfer of Care (Signed)
Immediate Anesthesia Transfer of Care Note  Patient: Russell Sullivan  Procedure(s) Performed: INTUBATION-ENDOTRACHEAL WITH TRACHEOSTOMY STANDBY (N/A Mouth)  Patient Location: PACU  Anesthesia Type:General  Level of Consciousness: sedated  Airway & Oxygen Therapy: Patient remains intubated per anesthesia plan and Patient placed on Ventilator (see vital sign flow sheet for setting)  Post-op Assessment: Report given to RN and Post -op Vital signs reviewed and stable  Post vital signs: Reviewed and stable  Last Vitals:  Vitals Value Taken Time  BP    Temp    Pulse    Resp    SpO2      Last Pain:  Vitals:   01/13/18 2256  TempSrc:   PainSc: 0-No pain         Complications: No apparent anesthesia complications

## 2018-01-13 NOTE — Consult Note (Signed)
Russell Sullivan, Russell Sullivan [id number] [birth year] Nance Pear, MD  Reason for Consult: Angioedema  HPI: 67 year old gentleman history of hypertension has been on ACE inhibitor for several years 2 hours prior to admission to the hospital started developing tongue swelling and difficulty swallowing.  Resented to the emergency room was seen by the ER physician who noted significant tongue swelling in the beginning of floor mouth swelling he was treated with IV medication but it has 10 you to swell.  He has had no airway obstruction at this point saturations have been normal he has no stridor.  Allergies: No Known Allergies  ROS: Review of systems normal other than 12 systems except per HPI.  PMH:  Past Medical History:  Diagnosis Date  . Hyperlipidemia   . Hypertension     FH:  Family History  Problem Relation Age of Onset  . Hypertension Mother   . Diabetes Mother   . Hypertension Father   . Diabetes Maternal Grandmother   . Diabetes Paternal Grandmother     SH:  Social History   Socioeconomic History  . Marital status: Married    Spouse name: Not on file  . Number of children: Not on file  . Years of education: Not on file  . Highest education level: Not on file  Occupational History  . Not on file  Social Needs  . Financial resource strain: Not on file  . Food insecurity:    Worry: Not on file    Inability: Not on file  . Transportation needs:    Medical: Not on file    Non-medical: Not on file  Tobacco Use  . Smoking status: Never Smoker  . Smokeless tobacco: Never Used  Substance and Sexual Activity  . Alcohol use: No    Frequency: Never  . Drug use: No  . Sexual activity: Not on file  Lifestyle  . Physical activity:    Days per week: Not on file    Minutes per session: Not on file  . Stress: Not on file  Relationships  . Social connections:    Talks on phone: Not on file    Gets together: Not on file    Attends religious service: Not on file    Active  member of club or organization: Not on file    Attends meetings of clubs or organizations: Not on file    Relationship status: Not on file  . Intimate partner violence:    Fear of current or ex partner: Not on file    Emotionally abused: Not on file    Physically abused: Not on file    Forced sexual activity: Not on file  Other Topics Concern  . Not on file  Social History Narrative  . Not on file    PSH:  Past Surgical History:  Procedure Laterality Date  . HERNIA REPAIR  2009  . left shoulder surgery      Physical  Exam: CN 2-12 grossly intact and symmetric.  Patient is sitting up in the bed no stridor has obvious tongue swelling.  Examination of the floor mouth shows some edema as well.  Patient the neck is benign. Procedure: Flexible fiberoptic laryngoscopy was performed at the bedside using a flexible fiberoptic laryngoscope this was placed through the right nostril.  Examination of the tongue base showed mild swelling but no obstruction.  The epiglottis was normal the larynx was normal there was normal vocal mobility.  A/P: Angioedema increasing tongue and floor mouth swelling.  Had a long  discussion with the patient and his wife I think at this point he could be intubated orally maintain his airway.  If the swelling worsens it would have to be a fiberoptic intubation or possible tracheostomy.  Spoke with anesthesia we will take him to the operating room and attempt oral intubation on an emergent basis patient's family and patient understand and are eager to proceed.   Roena Malady 01/13/2018 11:07 PM   [place]

## 2018-01-13 NOTE — ED Notes (Signed)
No change in oral swelling noted.

## 2018-01-13 NOTE — ED Provider Notes (Signed)
[facility] Emergency Department Provider Note  ____________________________________________   I have reviewed the triage vital signs and the nursing notes.   HISTORY  Chief Complaint Tongue swelling  History limited by: Not Limited   HPI Russell Sullivan is a 67 y.o. male who presents to the emergency department today because of concerns for tongue swelling.  The patient states that the swelling started roughly 2 hours ago.  He did eat some shrimp tonight although he has eaten shrimp many times without any issues.  The patient states that he has not had any itchiness.  No rashes.  No nausea or vomiting.  The patient states that he is having some difficulty swallowing.    Per medical record review patient has a history of HLD, HTN.  Past Medical History:  Diagnosis Date  . Hyperlipidemia   . Hypertension     Patient Active Problem List   Diagnosis Date Noted  . Localized superficial swelling, mass, or lump 06/28/2017  . Impaired fasting glucose 04/18/2017  . Nodular prostate without lower urinary tract symptoms 04/18/2017  . Psoriasis, unspecified 04/18/2017  . Low back pain 04/18/2017  . Gastro-esophageal reflux disease without esophagitis 04/18/2017  . Primary generalized (osteo)arthritis 04/18/2017  . Nicotine dependence, cigarettes, uncomplicated 23/53/6144  . Opioid use, unspecified, uncomplicated 31/54/0086  . Inflammatory polyarthropathy ([place]) 04/18/2017  . Other fatigue 04/18/2017  . Essential (primary) hypertension 04/18/2017  . Mixed hyperlipidemia 04/18/2017    Past Surgical History:  Procedure Laterality Date  . HERNIA REPAIR  2009  . left shoulder surgery      Prior to Admission medications   Medication Sig Start Date End Date Taking? Authorizing Provider  benazepril-hydrochlorthiazide (LOTENSIN HCT) 20-25 MG tablet Take 1 tablet by mouth daily. 12/09/17  Yes Ronnell Freshwater, NP  aspirin 81 MG tablet Take 81 mg by mouth as  needed for pain.    [provider]  fluticasone (FLONASE) 50 MCG/ACT nasal spray Place 2 sprays into both nostrils daily.    [provider]  HYDROcodone-acetaminophen (NORCO) 7.5-325 MG tablet Take 1 tablet by mouth 2 (two) times daily as needed for moderate pain. To refill after 05/03/2017 12/09/17   Ronnell Freshwater, NP  meloxicam (MOBIC) 15 MG tablet Take 1 tablet (15 mg total) by mouth daily as needed (for arthritis). 12/09/17   Ronnell Freshwater, NP  pantoprazole (PROTONIX) 40 MG tablet Take 1 tablet (40 mg total) by mouth daily. 12/09/17   Ronnell Freshwater, NP  rosuvastatin (CRESTOR) 20 MG tablet Take 1 tablet (20 mg total) by mouth at bedtime. 12/09/17   Ronnell Freshwater, NP  sildenafil (REVATIO) 20 MG tablet Take 20 mg by mouth as needed.    [provider]  tamsulosin (FLOMAX) 0.4 MG CAPS capsule Take 1 capsule (0.4 mg total) by mouth daily. 07/04/17   Ronnell Freshwater, NP  traMADol (ULTRAM) 50 MG tablet Take 1 tablet (50 mg total) by mouth 2 (two) times daily as needed. 12/09/17   Ronnell Freshwater, NP  triamcinolone cream (KENALOG) 0.1 % Apply 1 application topically as needed.    [provider]  vardenafil (LEVITRA) 20 MG tablet Take 20 mg by mouth as needed for erectile dysfunction.    [provider]    Allergies Patient has no known allergies.  Family History  Problem Relation Age of Onset  . Hypertension Mother   . Diabetes Mother   . Hypertension Father   . Diabetes Maternal Grandmother   .  Diabetes Paternal Grandmother     Social History Social History   Tobacco Use  . Smoking status: Never Smoker  . Smokeless tobacco: Never Used  Substance Use Topics  . Alcohol use: No    Frequency: Never  . Drug use: No    Review of Systems Constitutional: No fever/chills Eyes: No visual changes. ENT: Positive for tongue swelling.  Cardiovascular: Denies chest pain. Respiratory: Denies shortness of breath. Gastrointestinal:  No abdominal pain.  No nausea, no vomiting.  No diarrhea.   Genitourinary: Negative for dysuria. Musculoskeletal: Negative for back pain. Skin: Negative for rash. Neurological: Negative for headaches, focal weakness or numbness.  ____________________________________________   PHYSICAL EXAM:  VITAL SIGNS: ED Triage Vitals  Enc Vitals Group     BP 01/13/18 2159 (!) 151/82     Pulse Rate 01/13/18 2159 72     Resp --      Temp 01/13/18 2159 98 F (36.7 C)     Temp Source 01/13/18 2159 Oral     SpO2 01/13/18 2159 98 %     Weight 01/13/18 2156 212 lb (96.2 kg)     Height 01/13/18 2156 5\' 11"  (1.803 m)     Head Circumference --      Peak Flow --      Pain Score 01/13/18 2156 0   Constitutional: Alert and oriented.  Eyes: Conjunctivae are normal.  ENT      Head: Normocephalic and atraumatic.      Nose: No congestion/rhinnorhea.      Mouth/Throat: Tongue swelling.       Neck: No stridor. Hematological/Lymphatic/Immunilogical: No cervical lymphadenopathy. Cardiovascular: Normal rate, regular rhythm.  No murmurs, rubs, or gallops. Respiratory: Normal respiratory effort without tachypnea nor retractions. Breath sounds are clear and equal bilaterally. No wheezes/rales/rhonchi. Gastrointestinal: Soft and non tender. No rebound. No guarding.  Genitourinary: Deferred Musculoskeletal: Normal range of motion in all extremities. No lower extremity edema. Neurologic:  Normal speech and language. No gross focal neurologic deficits are appreciated.  Skin:  Skin is warm, dry and intact. No rash noted. Psychiatric: Mood and affect are normal. Speech and behavior are normal. Patient exhibits appropriate insight and judgment.  ____________________________________________    LABS (pertinent positives/negatives)  None  ____________________________________________   EKG  I, Nance Pear, attending physician, personally viewed and interpreted this EKG  EKG Time: 2208 Rate: 81 Rhythm:  sinus rhythm Axis: normal Intervals: qtc 501 QRS: RBBB ST changes: no st elevation Impression: abnormal ekg  ____________________________________________    RADIOLOGY  None  ____________________________________________   PROCEDURES  Procedures  ____________________________________________   INITIAL IMPRESSION / ASSESSMENT AND PLAN / ED COURSE  Pertinent labs & imaging results that were available during my care of the patient were reviewed by me and considered in my medical decision making (see chart for details).   Patient presented to the emergency department today because of concerns for tongue swelling.  On exam patient does have tongue swelling.  No other obvious allergic symptoms.  At this point I think likely angioedema secondary to ACE inhibitor use.  Did consult Dr. Tami Ribas who was kind enough to come evaluate the patient.  He evaluated the patient emergency department.  The patient was then taken to the operating room for intubation.   ____________________________________________   FINAL CLINICAL IMPRESSION(S) / ED DIAGNOSES  Final diagnoses:  Angioedema, initial encounter     Note: This dictation was prepared with Dragon dictation. Any transcriptional errors that result from this process are unintentional  Nance Pear, MD 01/13/18 [phone]

## 2018-01-13 NOTE — Anesthesia Postprocedure Evaluation (Signed)
Anesthesia Post Note  Patient: Russell Sullivan  Procedure(s) Performed: INTUBATION-ENDOTRACHEAL WITH TRACHEOSTOMY STANDBY (N/A Mouth)  Anesthesia Post Evaluation   Last Vitals:  Vitals:   01/13/18 2245 01/13/18 2256  BP:  (!) 173/70  Pulse: 85 77  Resp: 15 16  Temp:    SpO2: 99% 99%    Last Pain:  Vitals:   01/13/18 2256  TempSrc:   PainSc: 0-No pain                 Einar Grad Edwena Mayorga

## 2018-01-13 NOTE — Anesthesia Procedure Notes (Signed)
Procedure Name: Intubation Date/Time: 01/13/2018 11:26 PM Performed by: Chanetta Marshall, CRNA Pre-anesthesia Checklist: Patient identified, Emergency Drugs available, Suction available and Patient being monitored Patient Re-evaluated:Patient Re-evaluated prior to induction Oxygen Delivery Method: Circle system utilized Preoxygenation: Pre-oxygenation with 100% oxygen Induction Type: IV induction Laryngoscope Size: McGraph and 4 Grade View: Grade II Tube type: Oral Tube size: 7.0 mm Number of attempts: 1 Airway Equipment and Method: Bougie stylet Placement Confirmation: ETT inserted through vocal cords under direct vision,  positive ETCO2,  CO2 detector and breath sounds checked- equal and bilateral Secured at: 23 cm Tube secured with: Tape Dental Injury: Teeth and Oropharynx as per pre-operative assessment

## 2018-01-13 NOTE — ED Notes (Signed)
Dr. Tami Ribas here to see pt.

## 2018-01-13 NOTE — ED Triage Notes (Addendum)
Pt in with co swelling to tongue x 1 hr unsure of cause. Took 25 mg benadryl 1 hr pta. Pt is on bp meds but unsure of name.

## 2018-01-13 NOTE — ED Notes (Signed)
No changed in oral swelling noted.

## 2018-01-13 NOTE — ED Notes (Signed)
md in to reassess. 

## 2018-01-13 NOTE — Anesthesia Post-op Follow-up Note (Signed)
Anesthesia QCDR form completed.        

## 2018-01-13 NOTE — ED Notes (Signed)
Consent for intubation signed by wife (at pt request) and witnessed by this RN

## 2018-01-13 NOTE — ED Notes (Signed)
Pt states he feels like he cannot swallow. md notified.

## 2018-01-14 ENCOUNTER — Inpatient Hospital Stay: Payer: Medicare Other

## 2018-01-14 ENCOUNTER — Encounter: Payer: Self-pay | Admitting: Unknown Physician Specialty

## 2018-01-14 DIAGNOSIS — T783XXA Angioneurotic edema, initial encounter: Principal | ICD-10-CM

## 2018-01-14 LAB — BASIC METABOLIC PANEL
ANION GAP: 5 (ref 5–15)
Anion gap: 12 (ref 5–15)
BUN: 24 mg/dL — AB (ref 8–23)
BUN: 23 mg/dL (ref 8–23)
CALCIUM: 8.3 mg/dL — AB (ref 8.9–10.3)
CALCIUM: 8.3 mg/dL — AB (ref 8.9–10.3)
CHLORIDE: 105 mmol/L (ref 98–111)
CO2: 23 mmol/L (ref 22–32)
CO2: 25 mmol/L (ref 22–32)
CREATININE: 1.01 mg/dL (ref 0.61–1.24)
Chloride: 108 mmol/L (ref 98–111)
Creatinine, Ser: 1.15 mg/dL (ref 0.61–1.24)
GFR calc Af Amer: >60 mL/min (ref >60)
GFR calc non Af Amer: >60 mL/min (ref >60)
Glucose, Bld: 150 mg/dL — ABNORMAL HIGH (ref 70–99)
Glucose, Bld: 197 mg/dL — ABNORMAL HIGH (ref 70–99)
POTASSIUM: 4.6 mmol/L (ref 3.5–5.1)
Potassium: 2.5 mmol/L — CL (ref 3.5–5.1)
SODIUM: 140 mmol/L (ref 135–145)
Sodium: 138 mmol/L (ref 135–145)

## 2018-01-14 LAB — CBC
HEMATOCRIT: 40.1 % (ref 40.0–52.0)
Hemoglobin: 13.4 g/dL (ref 13.0–18.0)
MCH: 29.9 pg (ref 26.0–34.0)
MCHC: 33.5 g/dL (ref 32.0–36.0)
MCV: 89.3 fL (ref 80.0–100.0)
PLATELETS: 251 10*3/uL (ref 150–440)
RBC: 4.48 MIL/uL (ref 4.40–5.90)
RDW: 13.6 % (ref 11.5–14.5)
WBC: 7.9 10*3/uL (ref 3.8–10.6)

## 2018-01-14 LAB — CBC WITH DIFFERENTIAL/PLATELET
BASOS ABS: 0.1 10*3/uL (ref 0–0.1)
Basophils Relative: 1 %
EOS ABS: 0.3 10*3/uL (ref 0–0.7)
Eosinophils Relative: 2 %
HCT: 38.6 % — ABNORMAL LOW (ref 40.0–52.0)
HEMOGLOBIN: 13.1 g/dL (ref 13.0–18.0)
Lymphocytes Relative: 58 %
Lymphs Abs: 7.7 10*3/uL — ABNORMAL HIGH (ref 1.0–3.6)
MCH: 30.1 pg (ref 26.0–34.0)
MCHC: 34 g/dL (ref 32.0–36.0)
MCV: 88.4 fL (ref 80.0–100.0)
MONO ABS: 1.1 10*3/uL — AB (ref 0.2–1.0)
Monocytes Relative: 8 %
NEUTROS ABS: 4.1 10*3/uL (ref 1.4–6.5)
Neutrophils Relative %: 31 %
Platelets: 314 10*3/uL (ref 150–440)
RBC: 4.37 MIL/uL — ABNORMAL LOW (ref 4.40–5.90)
RDW: 13.4 % (ref 11.5–14.5)
WBC: 13.3 10*3/uL — AB (ref 3.8–10.6)

## 2018-01-14 LAB — BLOOD GAS, ARTERIAL
Acid-base deficit: 1.7 mmol/L (ref 0.0–2.0)
BICARBONATE: 24.3 mmol/L (ref 20.0–28.0)
FIO2: 50
MECHVT: 500 mL
Mechanical Rate: 15
O2 Saturation: 98.3 %
PCO2 ART: 45 mmHg (ref 32.0–48.0)
PEEP: 5 cmH2O
PH ART: 7.34 — AB (ref 7.350–7.450)
Patient temperature: 37
pO2, Arterial: 116 mmHg — ABNORMAL HIGH (ref 83.0–108.0)

## 2018-01-14 LAB — MRSA PCR SCREENING: MRSA by PCR: NEGATIVE

## 2018-01-14 LAB — GLUCOSE, CAPILLARY: Glucose-Capillary: 151 mg/dL — ABNORMAL HIGH (ref 70–99)

## 2018-01-14 MED ORDER — MIDAZOLAM HCL 2 MG/2ML IJ SOLN
2.0000 mg | Freq: Once | INTRAMUSCULAR | Status: AC
  Administered 2018-01-14: 2 mg via INTRAVENOUS

## 2018-01-14 MED ORDER — MIDAZOLAM HCL 2 MG/2ML IJ SOLN
INTRAMUSCULAR | Status: AC
  Administered 2018-01-14: 2 mg via INTRAVENOUS
  Filled 2018-01-14: qty 2

## 2018-01-14 MED ORDER — CHLORHEXIDINE GLUCONATE 0.12% ORAL RINSE (MEDLINE KIT)
15.0000 mL | Freq: Two times a day (BID) | OROMUCOSAL | Status: DC
  Administered 2018-01-14: 15 mL via OROMUCOSAL

## 2018-01-14 MED ORDER — ACETAMINOPHEN 325 MG PO TABS: 650.0000 mg | ORAL_TABLET | ORAL | Status: DC | PRN

## 2018-01-14 MED ORDER — FENTANYL 2500MCG IN NS 250ML (10MCG/ML) PREMIX INFUSION
INTRAVENOUS | Status: AC
  Administered 2018-01-14: 100 ug/h via INTRAVENOUS
  Filled 2018-01-14: qty 250

## 2018-01-14 MED ORDER — SODIUM CHLORIDE FLUSH 0.9 % IV SOLN
INTRAVENOUS | Status: AC
  Filled 2018-01-14: qty 10

## 2018-01-14 MED ORDER — FENTANYL 2500MCG IN NS 250ML (10MCG/ML) PREMIX INFUSION
25.0000 ug/h | INTRAVENOUS | Status: DC
  Administered 2018-01-14: 100 ug/h via INTRAVENOUS
  Filled 2018-01-14: qty 250

## 2018-01-14 MED ORDER — MIDAZOLAM HCL 2 MG/2ML IJ SOLN
2.0000 mg | Freq: Once | INTRAMUSCULAR | Status: AC
  Administered 2018-01-13 – 2018-01-14 (×2): 2 mg via INTRAVENOUS

## 2018-01-14 MED ORDER — POTASSIUM CHLORIDE 10 MEQ/100ML IV SOLN
10.0000 meq | INTRAVENOUS | Status: AC
  Administered 2018-01-14 (×4): 10 meq via INTRAVENOUS
  Filled 2018-01-14 (×4): qty 100

## 2018-01-14 MED ORDER — MIDAZOLAM HCL 2 MG/2ML IJ SOLN
INTRAMUSCULAR | Status: AC
  Filled 2018-01-14: qty 2

## 2018-01-14 MED ORDER — MIDAZOLAM HCL 2 MG/2ML IJ SOLN
1.0000 mg | INTRAMUSCULAR | Status: DC | PRN
  Administered 2018-01-14 (×2): 1 mg via INTRAVENOUS
  Filled 2018-01-14: qty 2

## 2018-01-14 MED ORDER — FENTANYL BOLUS VIA INFUSION
25.0000 ug | INTRAVENOUS | Status: DC | PRN
  Administered 2018-01-14: 25 ug via INTRAVENOUS
  Filled 2018-01-14: qty 25

## 2018-01-14 MED ORDER — ONDANSETRON HCL 4 MG/2ML IJ SOLN: 4.0000 mg | Freq: Four times a day (QID) | INTRAMUSCULAR | Status: DC | PRN

## 2018-01-14 MED ORDER — IPRATROPIUM-ALBUTEROL 0.5-2.5 (3) MG/3ML IN SOLN
3.0000 mL | Freq: Four times a day (QID) | RESPIRATORY_TRACT | Status: DC
  Administered 2018-01-14 – 2018-01-15 (×5): 3 mL via RESPIRATORY_TRACT
  Filled 2018-01-14 (×5): qty 3

## 2018-01-14 MED ORDER — MIDAZOLAM HCL 2 MG/2ML IJ SOLN
1.0000 mg | INTRAMUSCULAR | Status: DC | PRN
  Filled 2018-01-14: qty 2

## 2018-01-14 MED ORDER — DEXAMETHASONE SODIUM PHOSPHATE 10 MG/ML IJ SOLN
4.0000 mg | Freq: Two times a day (BID) | INTRAMUSCULAR | Status: DC
  Administered 2018-01-14 (×3): 4 mg via INTRAVENOUS
  Filled 2018-01-14 (×4): qty 0.4

## 2018-01-14 MED ORDER — FENTANYL CITRATE (PF) 100 MCG/2ML IJ SOLN
50.0000 ug | Freq: Once | INTRAMUSCULAR | Status: AC
  Administered 2018-01-15: 50 ug via INTRAVENOUS
  Filled 2018-01-14: qty 2

## 2018-01-14 MED ORDER — LORAZEPAM 2 MG/ML IJ SOLN
INTRAMUSCULAR | Status: AC
  Filled 2018-01-14: qty 1

## 2018-01-14 MED ORDER — FAMOTIDINE IN NACL 20-0.9 MG/50ML-% IV SOLN
20.0000 mg | Freq: Two times a day (BID) | INTRAVENOUS | Status: DC
  Administered 2018-01-14 (×3): 20 mg via INTRAVENOUS
  Filled 2018-01-14 (×2): qty 50

## 2018-01-14 MED ORDER — BISACODYL 10 MG RE SUPP: 10.0000 mg | Freq: Every day | RECTAL | Status: DC | PRN

## 2018-01-14 MED ORDER — ORAL CARE MOUTH RINSE
15.0000 mL | OROMUCOSAL | Status: DC
  Administered 2018-01-14 – 2018-01-15 (×5): 15 mL via OROMUCOSAL

## 2018-01-14 MED ORDER — LORAZEPAM 2 MG/ML IJ SOLN
1.0000 mg | Freq: Once | INTRAMUSCULAR | Status: AC
  Administered 2018-01-14: 1 mg via INTRAVENOUS

## 2018-01-14 NOTE — Anesthesia Pain Management Evaluation Note (Signed)
50 mg. Propofol given IV by Dr. Zara Chess @ 0000.

## 2018-01-14 NOTE — Anesthesia Pain Management Evaluation Note (Signed)
50 mg. Propofol given by Dr. Kayleen Memos @ [place].

## 2018-01-14 NOTE — OR Nursing (Signed)
Connected to Ventilator by Respiratory Tech.   Settings: TV-500                 PEEP-5                 Resp-15                 FIO2- 50

## 2018-01-14 NOTE — Progress Notes (Signed)
[facility] [place] NAME: Caden Fukushima    MR#:  [id number]  DATE OF BIRTH:  Sep 02, 1950  SUBJECTIVE:  CHIEF COMPLAINT:   Chief Complaint  Patient presents with  . Allergic Reaction   Patient on full ventilatory support.  30% oxygen. Afebrile.  REVIEW OF SYSTEMS:    Review of Systems  Unable to perform ROS: Intubated    DRUG ALLERGIES:  No Known Allergies  VITALS:  Blood pressure 106/65, pulse (!) 58, temperature 98 F (36.7 C), temperature source Axillary, resp. rate 15, height 6' (1.829 m), weight 98.4 kg, SpO2 96 %.  PHYSICAL EXAMINATION:   Physical Exam  GENERAL:  67 y.o.-year-old patient lying in the bed .  ET tube in place.  Sedated. EYES: Pupils equal, round, reactive to light and accommodation. No scleral icterus. Extraocular muscles intact.  HEENT: Head atraumatic, normocephalic. Oropharynx and nasopharynx clear.  NECK:  Supple, no jugular venous distention. No thyroid enlargement, no tenderness.  LUNGS: Normal breath sounds bilaterally, no wheezing, rales, rhonchi. No use of accessory muscles of respiration.  CARDIOVASCULAR: S1, S2 normal. No murmurs, rubs, or gallops.  ABDOMEN: Soft, nontender, nondistended. Bowel sounds present. No organomegaly or mass.  EXTREMITIES: No cyanosis, clubbing or edema b/l.    PSYCHIATRIC: The patient is sedated SKIN: No obvious rash, lesion, or ulcer.   LABORATORY PANEL:   CBC Recent Labs  Lab 01/14/18 0519  WBC 7.9  HGB 13.4  HCT 40.1  PLT 251   ------------------------------------------------------------------------------------------------------------------ Chemistries  Recent Labs  Lab 01/14/18 0519  NA 138  K 4.6  CL 108  CO2 25  GLUCOSE 197*  BUN 23  CREATININE 1.15  CALCIUM 8.3*   ------------------------------------------------------------------------------------------------------------------  Cardiac Enzymes No results for input(s): TROPONINI in the last  168 hours. ------------------------------------------------------------------------------------------------------------------  RADIOLOGY:  Portable Chest Xray  Result Date: 01/14/2018 CLINICAL DATA:  Respiratory failure EXAM: PORTABLE CHEST 1 VIEW COMPARISON:  None. FINDINGS: Endotracheal tube is 6 cm above the carina. Low volumes with minimal bibasilar atelectasis. Mild vascular congestion. Heart is borderline in size. No effusions or pneumothorax. IMPRESSION: Endotracheal tube 6 cm above the carina. Low volumes with bibasilar atelectasis and mild vascular congestion. Electronically Signed   By: Rolm Baptise M.D.   On: 01/14/2018 07:35     ASSESSMENT AND PLAN:   *Angioedema.  Emergently intubated in the operating room due to severity of the case.  Continue ventilatory support.  Discussed with Dr. Jefferson Fuel of ICU.  Extubation depending on patient's progress.  Likely due to ACE inhibitor. Steroids.   * Essential (primary) hypertension Well-controlled.  Avoid ACE inhibitors.  *Severe hypokalemia.  Replaced and now normal.  All the records are reviewed and case discussed with Care Management/Social Worker Management plans discussed with the patient, family and they are in agreement.  CODE STATUS: Full code  DVT Prophylaxis: SCDs  TOTAL TIME TAKING CARE OF THIS PATIENT: 35 minutes.   Leia Alf Jerry Haugen M.D on 01/14/2018 at 10:51 AM  Between 7am to 6pm - Pager - [phone]  After 6pm go to [email or web address] - password [email or web address] [facility]  Office  [phone]  CC: Primary care physician; Ronnell Freshwater, NP  Note: This dictation was prepared with Dragon dictation along with smaller phrase technology. Any transcriptional errors that result from this process are unintentional.

## 2018-01-14 NOTE — Anesthesia Pain Management Evaluation Note (Signed)
50 mg. Propofol given by Cr. Carroll @ [phone].

## 2018-01-14 NOTE — Anesthesia Pain Management Evaluation Note (Signed)
50 mg. Propofol given by Cr. Carroll @ [phone].

## 2018-01-14 NOTE — Consult Note (Signed)
Name: Russell Sullivan MRN: [id number] DOB: 1950-08-20    LOS: 1  Referring Provider:  Dr Jannifer Franklin Reason for Referral: Angioedema  PULMONARY / CRITICAL CARE MEDICINE  HPI: 67 year old male with a medical history as indicated below who presented to the ED with swollen tongue x1 hour.  Patient took Benadryl without any relief.  He was on lisinopril for hypertension but also indicated that he did eat some shrimp even though he has [facility] in the past without any issues.  He has no documented allergies.  ENT was consulted and patient was taken to the OR for intubation.  He was orally intubated and transferred to the ICU for further management.  He is stable on the vent.  Past Medical History:  Diagnosis Date  . Hyperlipidemia   . Hypertension    Past Surgical History:  Procedure Laterality Date  . HERNIA REPAIR  2009  . INTUBATION-ENDOTRACHEAL WITH TRACHEOSTOMY STANDBY N/A 01/13/2018   Procedure: INTUBATION-ENDOTRACHEAL WITH TRACHEOSTOMY STANDBY;  Surgeon: Beverly Gust, MD;  Location: [facility] ORS;  Service: ENT;  Laterality: N/A;  . left shoulder surgery     Prior to Admission medications   Medication Sig Start Date End Date Taking? Authorizing Provider  benazepril-hydrochlorthiazide (LOTENSIN HCT) 20-25 MG tablet Take 1 tablet by mouth daily. 12/09/17  Yes Boscia, Greer Ee, NP  HYDROcodone-acetaminophen (NORCO) 7.5-325 MG tablet Take 1 tablet by mouth 2 (two) times daily as needed for moderate pain. To refill after 05/03/2017 12/09/17  Yes Boscia, Greer Ee, NP  meloxicam (MOBIC) 15 MG tablet Take 1 tablet (15 mg total) by mouth daily as needed (for arthritis). 12/09/17  Yes Boscia, Greer Ee, NP  rosuvastatin (CRESTOR) 20 MG tablet Take 1 tablet (20 mg total) by mouth at bedtime. 12/09/17  Yes Boscia, Greer Ee, NP  traMADol (ULTRAM) 50 MG tablet Take 1 tablet (50 mg total) by mouth 2 (two) times daily as needed. 12/09/17  Yes Ronnell Freshwater, NP  aspirin 81 MG tablet Take 81 mg by mouth as  needed for pain.    [provider]  vardenafil (LEVITRA) 20 MG tablet Take 20 mg by mouth as needed for erectile dysfunction.    [provider]   Allergies No Known Allergies  Family History Family History  Problem Relation Age of Onset  . Hypertension Mother   . Diabetes Mother   . Hypertension Father   . Diabetes Maternal Grandmother   . Diabetes Paternal Grandmother    Social History  reports that he has never smoked. He has never used smokeless tobacco. He reports that he does not drink alcohol or use drugs.  Review Of Systems: Unable to obtain as patient is currently intubated and sedated  Vital Signs: Temp:  [97 F (36.1 C)-98 F (36.7 C)] 97.8 F (36.6 C) (10/05 0148) Pulse Rate:  [60-97] 60 (10/05 0347) Resp:  [9-30] 15 (10/05 0347) BP: (106-180)/(63-97) 120/70 (10/05 0148) SpO2:  [98 %-100 %] 100 % (10/05 0347) FiO2 (%):  [40 %-50 %] 40 % (10/05 0347) Weight:  [96.2 kg-98.4 kg] 98.4 kg (10/05 0148)  Physical Examination: General: Well-nourished, well-developed, no acute distress Neuro: Sedated, withdraws to noxious stimulus HEENT: PERRLA, trachea midline, oral mucosa moist, tongue swollen oropharynx not visible Cardiovascular: Pulse regular, and S2, no murmur regurg or gallop Lungs: Clear to auscultation bilaterally Abdomen: Nondistended, normal bowel sounds in all 4 quadrants Musculoskeletal: Deformities Skin: Warm and dry  ASSESSMENT  Principal Problem:   Angioedema Active Problems:   Gastro-esophageal reflux disease without  esophagitis   Essential (primary) hypertension   Mixed hyperlipidemia   PLAN  PULMONARY Airway compromise secondary to angioedema Recent Labs  Lab 01/14/18 0100  PHART 7.34*  PCO2ART 45  PO2ART 116*  HCO3 24.3  O2SAT 98.3   Ventilator Settings: Vent Mode: PRVC FiO2 (%):  [40 %-50 %] 40 % Set Rate:  [15 bmp] 15 bmp Vt Set:  [500 mL] 500 mL PEEP:  [5 cmH20] 5 cmH20 Plateau Pressure:  [16 cmH20] 16  cmH20 CXR: Unremarkable P:   Full vent support with current settings Weaning trials as tolerated Nebulized bronchodilators IV steroids Chest x-ray and ABG PRN  RENAL Hypokalemia Recent Labs  Lab 01/13/18 2248 01/14/18 0519  NA 140 138  K 2.5* 4.6  CL 105 108  CO2 23 25  BUN 24* 23  CREATININE 1.01 1.15  CALCIUM 8.3* 8.3*   Intake/Output      10/04 0701 - 10/05 0700 10/05 0701 - 10/06 0700   P.O. 0    I.V. (mL/kg) 300 (3)    IV Piggyback 1050    Total Intake(mL/kg) 1350 (13.7)    Urine (mL/kg/hr) 0    Total Output 0    Net +1350          P:   Monitor I's and O's Foley catheter Monitor and correct electrolyte imbalance  GASTROINTESTINAL P:   Pepcid for GI prophylaxis  HEMATOLOGIC Angioedema Recent Labs  Lab 01/13/18 2248 01/14/18 0519  HGB 13.1 13.4  HCT 38.6* 40.1  PLT 314 251   P:  DC lisinopril Monitor for resolution  INFECTIOUS Mild leukocytosis Recent Labs  Lab 01/13/18 2248 01/14/18 0519  WBC 13.3* 7.9   P:   Trend WBC Trend procalcitonin Monitor fever curve   NEUROLOGIC Fentanyl and Versed for vent sedation and comfort  BEST PRACTICE / DISPOSITION Level of Care: ICU Consultants: ENT Code Status: Full code Diet: N.p.o. DVT Px: Heparin GI Px: Pepcid  Lateia Fraser S. [facility] ANP-BC Pulmonary and Critical Care Medicine [facility] Pager [phone] or [phone]  NB: This document was prepared using Dragon voice recognition software and may include unintentional dictation errors.    01/14/2018, 7:52 AM

## 2018-01-14 NOTE — Anesthesia Pain Management Evaluation Note (Signed)
50 mg. Propofol given by Dr. Kayleen Memos @0039 

## 2018-01-14 NOTE — OR Nursing (Signed)
Potassium level of 2.5 reported to Dr. Jannifer Franklin.

## 2018-01-14 NOTE — Progress Notes (Signed)
01/14/2018 12:21 PM  Russell Sullivan, Russell Sullivan [id number]  Post-Op Day 1     Temp:  [97 F (36.1 C)-98 F (36.7 C)] 98 F (36.7 C) (10/05 0800) Pulse Rate:  [58-97] 63 (10/05 1100) Resp:  [9-30] 15 (10/05 1100) BP: (99-180)/(60-97) 104/62 (10/05 1100) SpO2:  [95 %-100 %] 96 % (10/05 1143) FiO2 (%):  [30 %-50 %] 30 % (10/05 1143) Weight:  [96.2 kg-98.4 kg] 98.4 kg (10/05 0148),     Intake/Output Summary (Last 24 hours) at 01/14/2018 1221 Last data filed at 01/14/2018 1000 Gross per 24 hour  Intake 2881.07 ml  Output 300 ml  Net 2581.07 ml    Results for orders placed or performed during the hospital encounter of 01/13/18 (from the past 24 hour(s))  CBC with Differential     Status: Abnormal   Collection Time: 01/13/18 10:48 PM  Result Value Ref Range   WBC 13.3 (H) 3.8 - 10.6 K/uL   RBC 4.37 (L) 4.40 - 5.90 MIL/uL   Hemoglobin 13.1 13.0 - 18.0 g/dL   HCT 38.6 (L) 40.0 - 52.0 %   MCV 88.4 80.0 - 100.0 fL   MCH 30.1 26.0 - 34.0 pg   MCHC 34.0 32.0 - 36.0 g/dL   RDW 13.4 11.5 - 14.5 %   Platelets 314 150 - 440 K/uL   Neutrophils Relative % 31 %   Lymphocytes Relative 58 %   Monocytes Relative 8 %   Eosinophils Relative 2 %   Basophils Relative 1 %   Neutro Abs 4.1 1.4 - 6.5 K/uL   Lymphs Abs 7.7 (H) 1.0 - 3.6 K/uL   Monocytes Absolute 1.1 (H) 0.2 - 1.0 K/uL   Eosinophils Absolute 0.3 0 - 0.7 K/uL   Basophils Absolute 0.1 0 - 0.1 K/uL   WBC Morphology ATYPICAL LYMPHOCYTES    Smear Review MORPHOLOGY UNREMARKABLE   Basic metabolic panel     Status: Abnormal   Collection Time: 01/13/18 10:48 PM  Result Value Ref Range   Sodium 140 135 - 145 mmol/L   Potassium 2.5 (LL) 3.5 - 5.1 mmol/L   Chloride 105 98 - 111 mmol/L   CO2 23 22 - 32 mmol/L   Glucose, Bld 150 (H) 70 - 99 mg/dL   BUN 24 (H) 8 - 23 mg/dL   Creatinine, Ser 1.01 0.61 - 1.24 mg/dL   Calcium 8.3 (L) 8.9 - 10.3 mg/dL   GFR calc non Af Amer >60 >60 mL/min   GFR calc Af Amer >60 >60 mL/min   Anion gap 12 5 - 15   Blood gas, arterial     Status: Abnormal   Collection Time: 01/14/18  1:00 AM  Result Value Ref Range   FIO2 50.00    Delivery systems VENTILATOR    Mode PRESSURE REGULATED VOLUME CONTROL    VT 500.0 mL   Peep/cpap 5.0 cm H20   pH, Arterial 7.34 (L) 7.350 - 7.450   pCO2 arterial 45 32.0 - 48.0 mmHg   pO2, Arterial 116 (H) 83.0 - 108.0 mmHg   Bicarbonate 24.3 20.0 - 28.0 mmol/L   Acid-base deficit 1.7 0.0 - 2.0 mmol/L   O2 Saturation 98.3 %   Patient temperature 37.0    Collection site RIGHT RADIAL    Sample type ARTERIAL DRAW    Allens test (pass/fail) PASS PASS   Mechanical Rate 15   Glucose, capillary     Status: Abnormal   Collection Time: 01/14/18  1:38 AM  Result Value Ref Range  Glucose-Capillary 151 (H) 70 - 99 mg/dL  MRSA PCR Screening     Status: None   Collection Time: 01/14/18  2:31 AM  Result Value Ref Range   MRSA by PCR NEGATIVE NEGATIVE  CBC     Status: None   Collection Time: 01/14/18  5:19 AM  Result Value Ref Range   WBC 7.9 3.8 - 10.6 K/uL   RBC 4.48 4.40 - 5.90 MIL/uL   Hemoglobin 13.4 13.0 - 18.0 g/dL   HCT 40.1 40.0 - 52.0 %   MCV 89.3 80.0 - 100.0 fL   MCH 29.9 26.0 - 34.0 pg   MCHC 33.5 32.0 - 36.0 g/dL   RDW 13.6 11.5 - 14.5 %   Platelets 251 150 - 440 K/uL  Basic metabolic panel     Status: Abnormal   Collection Time: 01/14/18  5:19 AM  Result Value Ref Range   Sodium 138 135 - 145 mmol/L   Potassium 4.6 3.5 - 5.1 mmol/L   Chloride 108 98 - 111 mmol/L   CO2 25 22 - 32 mmol/L   Glucose, Bld 197 (H) 70 - 99 mg/dL   BUN 23 8 - 23 mg/dL   Creatinine, Ser 1.15 0.61 - 1.24 mg/dL   Calcium 8.3 (L) 8.9 - 10.3 mg/dL   GFR calc non Af Amer >60 >60 mL/min   GFR calc Af Amer >60 >60 mL/min   Anion gap 5 5 - 15    SUBJECTIVE:  Intubated and sedated.     OBJECTIVE:  Floor of mouth still swollen as well as tongue.  Perhaps a little better.  IMPRESSION:  Angioedema  PLAN:  I would recommend another 24 hours of observation/medication/intubation  to be sure this is resolving.  He has only been intubated for approximately 13 hours.  I will come by to check him in the morning.    Roena Malady 01/14/2018, 12:21 PM

## 2018-01-15 ENCOUNTER — Inpatient Hospital Stay: Payer: Medicare Other

## 2018-01-15 MED ORDER — METHYLPREDNISOLONE 4 MG PO TBPK: 4.0000 mg | ORAL_TABLET | ORAL | Status: DC

## 2018-01-15 MED ORDER — FAMOTIDINE 20 MG PO TABS: 20.0000 mg | ORAL_TABLET | Freq: Two times a day (BID) | ORAL | Status: DC

## 2018-01-15 MED ORDER — METHYLPREDNISOLONE 4 MG PO TBPK: 4.0000 mg | ORAL_TABLET | Freq: Four times a day (QID) | ORAL | Status: DC

## 2018-01-15 MED ORDER — ALBUTEROL SULFATE (2.5 MG/3ML) 0.083% IN NEBU
INHALATION_SOLUTION | RESPIRATORY_TRACT | Status: AC
  Filled 2018-01-15: qty 3

## 2018-01-15 MED ORDER — FENTANYL CITRATE (PF) 100 MCG/2ML IJ SOLN
50.0000 ug | Freq: Once | INTRAMUSCULAR | Status: AC
  Administered 2018-01-15: 50 ug via INTRAVENOUS

## 2018-01-15 MED ORDER — METHYLPREDNISOLONE 4 MG PO TBPK
8.0000 mg | ORAL_TABLET | Freq: Every morning | ORAL | Status: DC
  Filled 2018-01-15 (×2): qty 21

## 2018-01-15 MED ORDER — ALBUTEROL SULFATE (2.5 MG/3ML) 0.083% IN NEBU
2.5000 mg | INHALATION_SOLUTION | RESPIRATORY_TRACT | Status: DC | PRN
  Administered 2018-01-15: 2.5 mg via RESPIRATORY_TRACT

## 2018-01-15 MED ORDER — FAMOTIDINE 20 MG PO TABS: 20.0000 mg | ORAL_TABLET | Freq: Two times a day (BID) | ORAL | 0 refills | Status: DC

## 2018-01-15 MED ORDER — METHYLPREDNISOLONE 4 MG PO TBPK: 4.0000 mg | ORAL_TABLET | Freq: Three times a day (TID) | ORAL | Status: DC

## 2018-01-15 MED ORDER — METHYLPREDNISOLONE 4 MG PO TBPK: 8.0000 mg | ORAL_TABLET | Freq: Every evening | ORAL | Status: DC

## 2018-01-15 MED ORDER — PREDNISONE 10 MG (21) PO TBPK: ORAL_TABLET | ORAL | 0 refills | Status: DC

## 2018-01-15 NOTE — Progress Notes (Signed)
Follow up - Critical Care Medicine Note  Patient Details:    Russell Sullivan is an 67 y.o. male.with a medical history remarkable for hypertension/diabetes, on benazepril who presented to the ED with swollen tongue x1 hour. Patient took Benadryl without any relief. He was on lisinopril for hypertension but also indicated that he did eat some shrimp even though he has [facility] in the past without any issues. He has no documented allergies. ENT was consulted and patient was taken to the OR for intubation. He was orally intubated and transferred to the ICU for further management.   Lines, Airways, Drains: Airway 7 mm (Active)  Secured at (cm) 23 cm 01/15/2018  7:36 AM  Measured From Lips 01/15/2018  7:36 AM  Secured Location Right 01/15/2018  7:36 AM  Secured By [employer] 01/15/2018  7:36 AM  Tube Holder Repositioned Yes 01/15/2018  7:36 AM  Cuff Pressure (cm H2O) 22 cm H2O 01/15/2018  7:36 AM  Site Condition Dry 01/15/2018  7:36 AM    Anti-infectives:  Anti-infectives (From admission, onward)   None      Microbiology: Results for orders placed or performed during the hospital encounter of 01/13/18  MRSA PCR Screening     Status: None   Collection Time: 01/14/18  2:31 AM  Result Value Ref Range Status   MRSA by PCR NEGATIVE NEGATIVE Final    Comment:        The GeneXpert MRSA Assay (FDA approved for NASAL specimens only), is one component of a comprehensive MRSA colonization surveillance program. It is not intended to diagnose MRSA infection nor to guide or monitor treatment for MRSA infections. Performed at [facility], [address] [place], [place] [address]     Studies: Dg Chest [facility] 1 View  Result Date: 01/15/2018 CLINICAL DATA:  Is shortness of breath EXAM: PORTABLE CHEST 1 VIEW COMPARISON:  01/14/2018 FINDINGS: Endotracheal tube is unchanged. Mild cardiomegaly. Bibasilar atelectasis. No effusions or edema. No acute bony abnormality. IMPRESSION:  Cardiomegaly.  Bibasilar atelectasis. Electronically Signed   By: Rolm Baptise M.D.   On: 01/15/2018 08:00   Portable Chest Xray  Result Date: 01/14/2018 CLINICAL DATA:  Respiratory failure EXAM: PORTABLE CHEST 1 VIEW COMPARISON:  None. FINDINGS: Endotracheal tube is 6 cm above the carina. Low volumes with minimal bibasilar atelectasis. Mild vascular congestion. Heart is borderline in size. No effusions or pneumothorax. IMPRESSION: Endotracheal tube 6 cm above the carina. Low volumes with bibasilar atelectasis and mild vascular congestion. Electronically Signed   By: Rolm Baptise M.D.   On: 01/14/2018 07:35    Consults:    Subjective:    Overnight Issues: Patient is doing well overnight, tongue swelling has resolved, perform spontaneous awakening and breathing trial with positive cuff leak pending extubation  Objective:  Vital signs for last 24 hours: Temp:  [97.7 F (36.5 C)-99.6 F (37.6 C)] 99.6 F (37.6 C) (10/06 0743) Pulse Rate:  [60-98] 92 (10/06 0700) Resp:  [11-16] 16 (10/06 0700) BP: (99-142)/(50-90) 113/59 (10/06 0700) SpO2:  [94 %-99 %] 97 % (10/06 0736) FiO2 (%):  [30 %] 30 % (10/06 0736) Weight:  [99.6 kg] 99.6 kg (10/06 0151)  Hemodynamic parameters for last 24 hours:    Intake/Output from previous day: 10/05 0701 - 10/06 0700 In: 3159.6 [I.V.:2254; IV Piggyback:905.6] Out: 1800 [Urine:1800]  Intake/Output this shift: No intake/output data recorded.  Vent settings for last 24 hours: Vent Mode: PSV FiO2 (%):  [30 %] 30 % Set Rate:  [10  bmp] 15 bmp Vt Set:  [500 mL] 500 mL PEEP:  [5 cmH20] 5 cmH20 Pressure Support:  [0 cmH20] 0 cmH20 Plateau Pressure:  [9 QIW97-98 cmH20] 9 cmH20  Physical Exam:  Vital signs: Please see the above listed vital signs HEENT: Trachea midline, tongue swelling resolved, no oral lesions appreciated, no jugular venous distention Cardiovascular: Regular rate and rhythm Pulmonary: Clear to auscultation Abdominal: Positive bowel  sounds, soft exam Extremities: No clubbing cyanosis or edema noted Neurologic: No focal deficits appreciated  Assessment/Plan:  Respiratory failure secondary to angioedema secondary to ACE inhibitor, status post intubation.  Has done well on systemic steroids along with H2-blocker.  This morning on spontaneous awakening and breathing trial, positive cuff leak, past weaning criteria for extubation.  Chest x-ray was stable.  We will proceed with extubation  Critical Care Total Time 35 minutes  Leon Goodnow 01/15/2018  *Care during the described time interval was provided by me and/or other providers on the critical care team.  I have reviewed this patient's available data, including medical history, events of note, physical examination and test results as part of my evaluation.

## 2018-01-15 NOTE — Discharge Summary (Signed)
Physician Discharge Summary  Patient ID: Russell Sullivan MRN: [id number] DOB/AGE: 1950-08-10 67 y.o.  Admit date: 01/13/2018 Discharge date: 01/15/2018  Admission Diagnoses: Angioedema  Discharge Diagnoses:  Principal Problem:   Angioedema Active Problems:   Gastro-esophageal reflux disease without esophagitis   Essential (primary) hypertension   Mixed hyperlipidemia   Discharged Condition: Patient signed out [facility] Course:   Russell Sullivan is an 67 y.o. male.with a medical history remarkable for hypertension/diabetes, on benazepril who presented to the ED with swollen tongue x1 hour. Patient took Benadryl without any relief. He was on lisinopril for hypertension but also indicated that he did eat some shrimp even though he has [facility] in the past without any issues. He has no documented allergies. ENT was consulted and patient was taken to the OR for intubation. He was orally intubated and transferred to the ICU for further management  Patient can subsequently did well in the intensive care unit, this morning on exam he had a positive cuff leak, passed spontaneous awakening and breathing trial and was successfully extubated.  Post extubation however he states that he wanted to leave the hospital AMA.  Consults: ENT  Significant Diagnostic Studies: CXR  Treatments: Intubation and Mechanical ventilation  Discharge Exam: Blood pressure 113/60, pulse 92, temperature 99.6 F (37.6 C), temperature source Oral, resp. rate 13, height 6' (1.829 m), weight 99.6 kg, SpO2 97 %.   Vital signs:       Please see the above listed vital signs HEENT:           Trachea midline, tongue swelling resolved, no oral lesions appreciated, no jugular venous distention Cardiovascular:           Regular rate and rhythm Pulmonary:      Clear to auscultation Abdominal:      Positive bowel sounds, soft exam Extremities:     No clubbing cyanosis or edema noted Neurologic:      No focal deficits  appreciated  Follow-up Information    Beverly Gust, MD Follow up in 1 week(s).   Specialty:  Otolaryngology Contact information: [address] [place] [address] [phone]          Disposition: Concerned for residual airway edema, wrote prescription for a Medrol Dosepak and a 1 week supply of H2-blocker.  Advised him if he becomes dyspneic in any way to present to the emergency department and to follow-up with his primary care provider  Signed: Azreal Stthomas 01/15/2018, 10:08 AM

## 2018-01-15 NOTE — Progress Notes (Signed)
Pt wanting to leave right now. Told pt that he was just extubated and needed to be monitored longer. Dr. Jefferson Fuel came to bedside, pt and family wanting to leave AMA. Explained to pt that insurance will not cover if he leaves AMA. Pt did not care and wanted to leave anyway. Pt signed AMA form, IVs DC and pt disconnected from monitor. Wife took prescriptions from this RN. Pt ambulated himself out of the unit with daughter.

## 2018-01-15 NOTE — Progress Notes (Signed)
Patient ID: Russell Sullivan, male   DOB: September 21, 1950, 67 y.o.   MRN: [id number] Pulmonary Critical Care Attending  Patient just extubated does not want to stay any longer. Wishes to leave AMA. Strongly advised against it but he wishes to leave anyway Will give a medrol dose pack and pepcid for 7 day Advised with respiratory status worsens he is to come to ED ASAP  Hermelinda Dellen, DO

## 2018-01-15 NOTE — Progress Notes (Signed)
Patient extubated per MD request, with no complications. Will continue to monitor.

## 2018-01-15 NOTE — Progress Notes (Signed)
Pt extubated, with no complications. O2 sats 97% on 2 liters and pt has been instructed to cough up secretions and how to use the [place]. Will continue to monitor.

## 2018-01-15 NOTE — Progress Notes (Signed)
01/15/2018 9:45 AM  Sullivan, Russell Jupiter [id number]  Post-Op Day 2    Temp:  [97.7 F (36.5 C)-99.6 F (37.6 C)] 99.6 F (37.6 C) (10/06 0743) Pulse Rate:  [60-98] 92 (10/06 0800) Resp:  [11-16] 13 (10/06 0800) BP: (99-142)/(50-90) 113/60 (10/06 0800) SpO2:  [94 %-99 %] 97 % (10/06 0736) FiO2 (%):  [30 %] 30 % (10/06 0736) Weight:  [99.6 kg] 99.6 kg (10/06 0151),     Intake/Output Summary (Last 24 hours) at 01/15/2018 0945 Last data filed at 01/15/2018 0916 Gross per 24 hour  Intake 1830.98 ml  Output 1950 ml  Net -119.02 ml    No results found for this or any previous visit (from the past 24 hour(s)).  SUBJECTIVE:  Extubated this am.  No stridor, speech excellent.  OBJECTIVE:  Tongue and floor of mouth back to normal.  IMPRESSION:  Andioedema-resolved.  PLAN:  DC to home later today, I will see him back as an outpatient.  Avoid any ACE-I drugs.  Avoid shrimp.  I will test him for shrimp allergy when I see him in the office.    Roena Malady 01/15/2018, 9:45 AM

## 2018-01-15 NOTE — Progress Notes (Signed)
[place] [facility] [place] NAME: Russell Sullivan    MR#:  [id number]  DATE OF BIRTH:  [birth year]  SUBJECTIVE:  CHIEF COMPLAINT:   Chief Complaint  Patient presents with  . Allergic Reaction   Earlier today.  Continues to be on oxygen.  N.p.o.  Swelling improved. Pressure normal  REVIEW OF SYSTEMS:    Review of Systems  Constitutional: Positive for malaise/fatigue. Negative for chills and fever.  HENT: Negative for sore throat.   Eyes: Negative for blurred vision, double vision and pain.  Respiratory: Negative for cough, hemoptysis, shortness of breath and wheezing.   Cardiovascular: Negative for chest pain, palpitations, orthopnea and leg swelling.  Gastrointestinal: Negative for abdominal pain, constipation, diarrhea, heartburn, nausea and vomiting.  Genitourinary: Negative for dysuria and hematuria.  Musculoskeletal: Negative for back pain and joint pain.  Skin: Negative for rash.  Neurological: Negative for sensory change, speech change, focal weakness and headaches.  Endo/Heme/Allergies: Does not bruise/bleed easily.  Psychiatric/Behavioral: Negative for depression. The patient is not nervous/anxious.     DRUG ALLERGIES:  No Known Allergies  VITALS:  Blood pressure 113/60, pulse 92, temperature 99.6 F (37.6 C), temperature source Oral, resp. rate 13, height 6' (1.829 m), weight 99.6 kg, SpO2 97 %.  PHYSICAL EXAMINATION:   Physical Exam  GENERAL:  67 y.o.-year-old patient lying in the bed . EYES: Pupils equal, round, reactive to light and accommodation. No scleral icterus. Extraocular muscles intact.  HEENT: Head atraumatic, normocephalic. Oropharynx and nasopharynx clear.  NECK:  Supple, no jugular venous distention. No thyroid enlargement, no tenderness.  LUNGS: Right basal crackles.  Good air entry. CARDIOVASCULAR: S1, S2 normal. No murmurs, rubs, or gallops.  ABDOMEN: Soft, nontender, nondistended. Bowel sounds present. No  organomegaly or mass.  EXTREMITIES: No cyanosis, clubbing or edema b/l.    PSYCHIATRIC: The patient is awake and alert SKIN: No obvious rash, lesion, or ulcer.   LABORATORY PANEL:   CBC Recent Labs  Lab 01/14/18 0519  WBC 7.9  HGB 13.4  HCT 40.1  PLT 251   ------------------------------------------------------------------------------------------------------------------ Chemistries  Recent Labs  Lab 01/14/18 0519  NA 138  K 4.6  CL 108  CO2 25  GLUCOSE 197*  BUN 23  CREATININE 1.15  CALCIUM 8.3*   ------------------------------------------------------------------------------------------------------------------  Cardiac Enzymes No results for input(s): TROPONINI in the last 168 hours. ------------------------------------------------------------------------------------------------------------------  RADIOLOGY:  Dg Chest Port 1 View  Result Date: 01/15/2018 CLINICAL DATA:  Is shortness of breath EXAM: PORTABLE CHEST 1 VIEW COMPARISON:  01/14/2018 FINDINGS: Endotracheal tube is unchanged. Mild cardiomegaly. Bibasilar atelectasis. No effusions or edema. No acute bony abnormality. IMPRESSION: Cardiomegaly.  Bibasilar atelectasis. Electronically Signed   By: Rolm Baptise M.D.   On: 01/15/2018 08:00   Portable Chest Xray  Result Date: 01/14/2018 CLINICAL DATA:  Respiratory failure EXAM: PORTABLE CHEST 1 VIEW COMPARISON:  None. FINDINGS: Endotracheal tube is 6 cm above the carina. Low volumes with minimal bibasilar atelectasis. Mild vascular congestion. Heart is borderline in size. No effusions or pneumothorax. IMPRESSION: Endotracheal tube 6 cm above the carina. Low volumes with bibasilar atelectasis and mild vascular congestion. Electronically Signed   By: Rolm Baptise M.D.   On: 01/14/2018 07:35     ASSESSMENT AND PLAN:   *Angioedema.  Emergently intubated in the operating room due to severity of the case.  On steroids and H2 blockers. Extubated today.   * Essential  (primary) hypertension Well-controlled.  Avoid ACE inhibitors.  *Severe hypokalemia.  Replaced and  now normal.  Patient wanting to leave later today.  Will consider discharge depending on progress.  All the records are reviewed and case discussed with Care Management/Social Worker Management plans discussed with the patient, family and they are in agreement.  CODE STATUS: Full code  DVT Prophylaxis: SCDs  TOTAL TIME TAKING CARE OF THIS PATIENT: 35 minutes.   Leia Alf Benjimin Hadden M.D on 01/15/2018 at 10:21 AM  Between 7am to 6pm - Pager - [phone]  After 6pm go to [email or web address] - password [email or web address] [facility]  Office  [phone]  CC: Primary care physician; Ronnell Freshwater, NP  Note: This dictation was prepared with Dragon dictation along with smaller phrase technology. Any transcriptional errors that result from this process are unintentional.

## 2018-01-16 LAB — HIV ANTIBODY (ROUTINE TESTING W REFLEX): HIV Screen 4th Generation wRfx: NONREACTIVE

## 2018-01-30 DIAGNOSIS — Z23 Encounter for immunization: Secondary | ICD-10-CM | POA: Diagnosis not present

## 2018-02-09 ENCOUNTER — Ambulatory Visit (INDEPENDENT_AMBULATORY_CARE_PROVIDER_SITE_OTHER): Payer: Medicare Other | Admitting: Adult Health

## 2018-02-09 ENCOUNTER — Ambulatory Visit
Admission: RE | Admit: 2018-02-09 | Discharge: 2018-02-09 | Disposition: A | Discharge status: Home / Self Care | Payer: Medicare Other | Source: Ambulatory Visit | Attending: Adult Health | Admitting: Adult Health

## 2018-02-09 ENCOUNTER — Encounter: Payer: Self-pay | Admitting: Adult Health

## 2018-02-09 VITALS — BP 153/84 | HR 125 | Temp 97.5°F | Resp 16 | Ht 71.0 in | Wt 209.0 lb

## 2018-02-09 DIAGNOSIS — R0602 Shortness of breath: Secondary | ICD-10-CM | POA: Diagnosis not present

## 2018-02-09 DIAGNOSIS — R05 Cough: Secondary | ICD-10-CM | POA: Diagnosis not present

## 2018-02-09 DIAGNOSIS — R000 Tachycardia, unspecified: Secondary | ICD-10-CM

## 2018-02-09 DIAGNOSIS — R6889 Other general symptoms and signs: Secondary | ICD-10-CM | POA: Diagnosis not present

## 2018-02-09 DIAGNOSIS — I10 Essential (primary) hypertension: Secondary | ICD-10-CM | POA: Diagnosis not present

## 2018-02-09 DIAGNOSIS — J181 Lobar pneumonia, unspecified organism: Secondary | ICD-10-CM | POA: Diagnosis not present

## 2018-02-09 DIAGNOSIS — R059 Cough, unspecified: Secondary | ICD-10-CM

## 2018-02-09 DIAGNOSIS — J189 Pneumonia, unspecified organism: Secondary | ICD-10-CM

## 2018-02-09 DIAGNOSIS — R918 Other nonspecific abnormal finding of lung field: Secondary | ICD-10-CM | POA: Diagnosis not present

## 2018-02-09 LAB — POCT INFLUENZA A/B
Influenza A, POC: NEGATIVE
Influenza B, POC: NEGATIVE

## 2018-02-09 MED ORDER — METHYLPREDNISOLONE ACETATE 80 MG/ML IJ SUSP
80.0000 mg | Freq: Once | INTRAMUSCULAR | Status: AC
  Administered 2018-02-09: 80 mg via INTRAMUSCULAR

## 2018-02-09 MED ORDER — IPRATROPIUM-ALBUTEROL 0.5-2.5 (3) MG/3ML IN SOLN
3.0000 mL | Freq: Once | RESPIRATORY_TRACT | Status: AC
  Administered 2018-02-09: 3 mL via RESPIRATORY_TRACT

## 2018-02-09 MED ORDER — LEVOFLOXACIN 500 MG PO TABS: 500.0000 mg | ORAL_TABLET | Freq: Every day | ORAL | 0 refills | Status: DC

## 2018-02-09 NOTE — Progress Notes (Signed)
[facility] [place], [place] [address]  Internal MEDICINE  Office Visit Note  Patient Name: Russell Sullivan  [id number]  [id number]  Date of Service: 02/09/2018  Chief Complaint  Patient presents with  . Sinusitis  . Shortness of Breath  . Cough  . Generalized Body Aches     HPI Pt is here for a sick visit.  He reports he was recently hospitalized for angioedema.  He was intubated in the ICU for 1.5 days. He reports he had eaten shrimp  Earlier than evening, and he feels like this could have caused his issues.  He denies any similar reaction when eating seafood before.  He also was taking an ACE-Inhibitor, which he had been taking for years. The assumption is that the ace-inhibitor is the likely cause.  He was taken off most of his medications while hospitalized.  The patient decided to leave AMA about 2 hours after extubation.  He was given a RX for Steroids and H2 blocker.  He reports he had taken about half the steroids before stopping, and has been taking the Pepcid.  When asked why he decided to stop taking the steroids, he reported "I didn't want them anymore".  He is complaining today of SOB, Cough, some pain in is left side, and mild sinus symptoms.  He largest concern is that of his breathing difficulty.  His last chest x-ray was done on 01/15/2018 while intubated in ICU and it showed:" Cardiomegaly.  Bibasilar atelectasis. "        Current Medication:  Outpatient Encounter Medications as of 02/09/2018  Medication Sig  . meloxicam (MOBIC) 15 MG tablet Take by mouth.  . famotidine (PEPCID) 20 MG tablet Take 1 tablet (20 mg total) by mouth 2 (two) times daily for 7 days.  Marland Kitchen levofloxacin (LEVAQUIN) 500 MG tablet Take 1 tablet (500 mg total) by mouth daily.  . predniSONE (STERAPRED UNI-PAK 21 TAB) 10 MG (21) TBPK tablet 6 tabs day 1 and taper 10 mg a day - 6 days (Patient not taking: Reported on 02/09/2018)  . [EXPIRED] ipratropium-albuterol (DUONEB)  0.5-2.5 (3) MG/3ML nebulizer solution 3 mL   . [EXPIRED] methylPREDNISolone acetate (DEPO-MEDROL) injection 80 mg    No facility-administered encounter medications on file as of 02/09/2018.       Medical History: Past Medical History:  Diagnosis Date  . Hyperlipidemia   . Hypertension      Vital Signs: BP (!) 153/84   Pulse (!) 125   Temp (!) 97.5 F (36.4 C)   Resp 16   Ht 5\' 11"  (1.803 m)   Wt 209 lb (94.8 kg)   SpO2 95%   BMI 29.15 kg/m    Review of Systems  Constitutional: Negative.  Negative for chills, fatigue and unexpected weight change.  HENT: Negative.  Negative for congestion, rhinorrhea, sneezing and sore throat.   Eyes: Negative for redness.  Respiratory: Positive for cough, chest tightness and shortness of breath.   Cardiovascular: Negative.  Negative for chest pain and palpitations.  Gastrointestinal: Negative.  Negative for abdominal pain, constipation, diarrhea, nausea and vomiting.  Endocrine: Negative.   Genitourinary: Negative.  Negative for dysuria and frequency.  Musculoskeletal: Negative.  Negative for arthralgias, back pain, joint swelling and neck pain.  Skin: Negative.  Negative for rash.  Allergic/Immunologic: Negative.   Neurological: Negative.  Negative for tremors and numbness.  Hematological: Negative for adenopathy. Does not bruise/bleed easily.  Psychiatric/Behavioral: Negative.  Negative for behavioral problems, sleep disturbance  and suicidal ideas. The patient is not nervous/anxious.     Physical Exam  Constitutional: He is oriented to person, place, and time. He appears well-developed and well-nourished. No distress.  HENT:  Head: Normocephalic and atraumatic.  Mouth/Throat: Oropharynx is clear and moist. No oropharyngeal exudate.  Eyes: Pupils are equal, round, and reactive to light. EOM are normal.  Neck: Normal range of motion. Neck supple. No JVD present. No tracheal deviation present. No thyromegaly present.   Cardiovascular: Regular rhythm and normal heart sounds. Exam reveals no gallop and no friction rub.  No murmur heard. Tachycardia  Pulmonary/Chest: Effort normal. No respiratory distress. He has decreased breath sounds in the right lower field and the left lower field. He has wheezes in the left lower field. He has no rales. He exhibits tenderness.  Abdominal: Soft. There is no tenderness. There is no guarding.  Musculoskeletal: Normal range of motion.  Lymphadenopathy:    He has no cervical adenopathy.  Neurological: He is alert and oriented to person, place, and time. No cranial nerve deficit.  Skin: Skin is warm and dry. He is not diaphoretic.  Psychiatric: He has a normal mood and affect. His behavior is normal. Judgment and thought content normal.  Nursing note and vitals reviewed.   Assessment/Plan: 1. Pneumonia of left lower lobe due to infectious organism [facility]) Patient stat chest x-ray shows left lower lung opacity, the radiologist is concerning for pneumonia.  Since that clinically correlates will give patient course of Levaquin.  Spoke with wife and encouraged her to to pick up medication and start the patient on them immediately.  Also told patient's wife if patient got worse or had any symptom changes overnight that he should go directly to the emergency room.  I am hopeful that with the steroid injection today starting the Levaquin that he may do better in the next 24 to 48 hours - levofloxacin (LEVAQUIN) 500 MG tablet; Take 1 tablet (500 mg total) by mouth daily.  Dispense: 10 tablet; Refill: 0 Patient was loaned in office nebulizer and given a few doses of DuoNeb to take home.  He will return to office tomorrow for follow-up and reevaluation.    2. SOB (shortness of breath) Patient had pain with inspiration and stated that he felt like he could not breathe.  Breath sounds were noted to be diminished and a DuoNeb nebulizer was provided in exam room. - ipratropium-albuterol  (DUONEB) 0.5-2.5 (3) MG/3ML nebulizer solution 3 mL - DG Chest 2 View; Future  3. Tachycardia Patient tachycardic in exam room.  We will continue to monitor.  4. Cough - DG Chest 2 View; Future - methylPREDNISolone acetate (DEPO-MEDROL) injection 80 mg  5. Flu-like symptoms Patient was negative for influenza a and B at this time. - POCT Influenza A/B  6. Hypertension, unspecified type Patient's antihypertensive at today's visit.  This is understandable secondary to his pneumonia as well as the pain level he currently reports.  General Counseling: Vernor verbalizes understanding of the findings of todays visit and agrees with plan of treatment. I have discussed any further diagnostic evaluation that may be needed or ordered today. We also reviewed his medications today. he has been encouraged to call the office with any questions or concerns that should arise related to todays visit.   Orders Placed This Encounter  Procedures  . DG Chest 2 View  . POCT Influenza A/B    Meds ordered this encounter  Medications  . ipratropium-albuterol (DUONEB) 0.5-2.5 (3) MG/3ML nebulizer solution  3 mL  . methylPREDNISolone acetate (DEPO-MEDROL) injection 80 mg  . levofloxacin (LEVAQUIN) 500 MG tablet    Sig: Take 1 tablet (500 mg total) by mouth daily.    Dispense:  10 tablet    Refill:  0    Time spent: 30 Minutes  This patient was seen by Orson Gear AGNP-C in Collaboration with Dr Lavera Guise as a part of collaborative care agreement.  Kendell Bane AGNP-C Internal Medicine

## 2018-02-10 ENCOUNTER — Ambulatory Visit: Payer: Medicare Other | Admitting: Adult Health

## 2018-02-10 ENCOUNTER — Encounter: Payer: Self-pay | Admitting: Adult Health

## 2018-02-10 VITALS — BP 118/60 | HR 115 | Resp 16 | Ht 71.0 in | Wt 209.0 lb

## 2018-02-10 DIAGNOSIS — R000 Tachycardia, unspecified: Secondary | ICD-10-CM | POA: Diagnosis not present

## 2018-02-10 DIAGNOSIS — R0602 Shortness of breath: Secondary | ICD-10-CM | POA: Diagnosis not present

## 2018-02-10 DIAGNOSIS — R05 Cough: Secondary | ICD-10-CM | POA: Diagnosis not present

## 2018-02-10 DIAGNOSIS — R059 Cough, unspecified: Secondary | ICD-10-CM

## 2018-02-10 DIAGNOSIS — J189 Pneumonia, unspecified organism: Secondary | ICD-10-CM

## 2018-02-10 DIAGNOSIS — J181 Lobar pneumonia, unspecified organism: Secondary | ICD-10-CM

## 2018-02-10 MED ORDER — METHYLPREDNISOLONE ACETATE 80 MG/ML IJ SUSP
80.0000 mg | Freq: Once | INTRAMUSCULAR | Status: AC
  Administered 2018-02-10: 60 mg via INTRAMUSCULAR

## 2018-02-10 NOTE — Progress Notes (Signed)
[facility] [place], [place] [address]  Internal MEDICINE  Office Visit Note  Patient Name: Russell Sullivan  [id number]  [id number]  Date of Service: 02/10/2018  Chief Complaint  Patient presents with  . Pneumonia    not feeling any better     HPI  Patient is here today for follow-up from yesterday's acute visit.  His chest x-ray showed a left lower lobe pneumonia.  He returns today looking slightly better.  He reports he does not feel any better and still has an all pain with inspiration.  He is now taken 2 doses of the Levaquin course.  He did 3 DuoNeb breathing treatments yesterday and overnight.  He reports those do not seem to help.  His wife reports that she has been taking his temperature, the highest she has found is 100.1.   Current Medication: Outpatient Encounter Medications as of 02/10/2018  Medication Sig  . famotidine (PEPCID) 20 MG tablet Take 1 tablet (20 mg total) by mouth 2 (two) times daily for 7 days.  Marland Kitchen levofloxacin (LEVAQUIN) 500 MG tablet Take 1 tablet (500 mg total) by mouth daily.  . meloxicam (MOBIC) 15 MG tablet Take by mouth.  . predniSONE (STERAPRED UNI-PAK 21 TAB) 10 MG (21) TBPK tablet 6 tabs day 1 and taper 10 mg a day - 6 days (Patient not taking: Reported on 02/09/2018)  . [EXPIRED] methylPREDNISolone acetate (DEPO-MEDROL) injection 80 mg    No facility-administered encounter medications on file as of 02/10/2018.     Surgical History: Past Surgical History:  Procedure Laterality Date  . HERNIA REPAIR  2009  . INTUBATION-ENDOTRACHEAL WITH TRACHEOSTOMY STANDBY N/A 01/13/2018   Procedure: INTUBATION-ENDOTRACHEAL WITH TRACHEOSTOMY STANDBY;  Surgeon: Beverly Gust, MD;  Location: [facility] ORS;  Service: ENT;  Laterality: N/A;  . left shoulder surgery      Medical History: Past Medical History:  Diagnosis Date  . Hyperlipidemia   . Hypertension     Family History: Family History  Problem Relation Age of Onset  .  Hypertension Mother   . Diabetes Mother   . Hypertension Father   . Diabetes Maternal Grandmother   . Diabetes Paternal Grandmother     Social History   Socioeconomic History  . Marital status: Married    Spouse name: Not on file  . Number of children: Not on file  . Years of education: Not on file  . Highest education level: Not on file  Occupational History  . Not on file  Social Needs  . Financial resource strain: Not on file  . Food insecurity:    Worry: Not on file    Inability: Not on file  . Transportation needs:    Medical: Not on file    Non-medical: Not on file  Tobacco Use  . Smoking status: Never Smoker  . Smokeless tobacco: Never Used  Substance and Sexual Activity  . Alcohol use: No    Frequency: Never  . Drug use: No  . Sexual activity: Not on file  Lifestyle  . Physical activity:    Days per week: Not on file    Minutes per session: Not on file  . Stress: Not on file  Relationships  . Social connections:    Talks on phone: Not on file    Gets together: Not on file    Attends religious service: Not on file    Active member of club or organization: Not on file    Attends meetings of clubs  or organizations: Not on file    Relationship status: Not on file  . Intimate partner violence:    Fear of current or ex partner: Not on file    Emotionally abused: Not on file    Physically abused: Not on file    Forced sexual activity: Not on file  Other Topics Concern  . Not on file  Social History Narrative  . Not on file      Review of Systems  Constitutional: Negative.  Negative for chills, fatigue and unexpected weight change.  HENT: Negative.  Negative for congestion, rhinorrhea, sneezing and sore throat.   Eyes: Negative for redness.  Respiratory: Positive for cough. Negative for chest tightness and shortness of breath.        Pain with inspiration.  Cardiovascular: Negative.  Negative for chest pain and palpitations.  Gastrointestinal:  Negative.  Negative for abdominal pain, constipation, diarrhea, nausea and vomiting.  Endocrine: Negative.   Genitourinary: Negative.  Negative for dysuria and frequency.  Musculoskeletal: Negative.  Negative for arthralgias, back pain, joint swelling and neck pain.  Skin: Negative.  Negative for rash.  Allergic/Immunologic: Negative.   Neurological: Negative.  Negative for tremors and numbness.  Hematological: Negative for adenopathy. Does not bruise/bleed easily.  Psychiatric/Behavioral: Negative.  Negative for behavioral problems, sleep disturbance and suicidal ideas. The patient is not nervous/anxious.     Vital Signs: BP 118/60 (BP Location: Right Arm, Patient Position: Sitting, Cuff Size: Normal)   Pulse (!) 115   Resp 16   Ht 5\' 11"  (1.803 m)   Wt 209 lb (94.8 kg)   SpO2 97%   BMI 29.15 kg/m    Physical Exam  Constitutional: He is oriented to person, place, and time. He appears well-developed and well-nourished. No distress.  Pt appears slightly better than yesterday.  Color is improved.  HENT:  Head: Normocephalic and atraumatic.  Mouth/Throat: Oropharynx is clear and moist. No oropharyngeal exudate.  Eyes: Pupils are equal, round, and reactive to light. EOM are normal.  Neck: Normal range of motion. Neck supple. No JVD present. No tracheal deviation present. No thyromegaly present.  Cardiovascular: Normal rate, regular rhythm and normal heart sounds. Exam reveals no gallop and no friction rub.  No murmur heard. Pulmonary/Chest: Effort normal. No respiratory distress. He has no wheezes. He has no rales. He exhibits no tenderness.  Decreased breath sounds in bases.  No adventitious sounds noted.  Abdominal: Soft. There is no tenderness. There is no guarding.  Musculoskeletal: Normal range of motion.  Lymphadenopathy:    He has no cervical adenopathy.  Neurological: He is alert and oriented to person, place, and time. No cranial nerve deficit.  Skin: Skin is warm and dry.  He is not diaphoretic.  Psychiatric: He has a normal mood and affect. His behavior is normal. Judgment and thought content normal.  Nursing note and vitals reviewed.   Assessment/Plan: 1. Pneumonia of left lower lobe due to infectious organism [facility]) Patient has taken 2 doses of Levaquin, will give him a 60 mg injection of Depo-Medrol today.  I had a lengthy discussion with the patient and his wife about him going to the hospital for IV antibiotics.  He continues to decline that at this time.  I instructed the patient's wife that if he had any change in symptoms, or she felt like he was worse to take him to the hospital.  We discussed signs to look for including lethargy, or fever greater than 101.  Patient and wife verbalized  understanding of these instructions.  And I informed them that we had an on-call service whom they could call over the weekend if they had questions. - methylPREDNISolone acetate (DEPO-MEDROL) injection 60 mg  2. SOB (shortness of breath) Patient does not appear as short of breath today, the pain he has with inspiration causes him to take short breaths.  Instructed patient that deep breathing 10 times an hour would help him.  3. Cough Patient reports cough continues however, his wife reports it seems a little better.  4. Tachycardia Tachycardia noted at today's visit with a rate of 115 bpm.  This is down from yesterday at 125 bpm.  General Counseling: Darden verbalizes understanding of the findings of todays visit and agrees with plan of treatment. I have discussed any further diagnostic evaluation that may be needed or ordered today. We also reviewed his medications today. he has been encouraged to call the office with any questions or concerns that should arise related to todays visit.    No orders of the defined types were placed in this encounter.   Meds ordered this encounter  Medications  . methylPREDNISolone acetate (DEPO-MEDROL) injection 80 mg    Time  spent: 25 Minutes   This patient was seen by Orson Gear AGNP-C in Collaboration with Dr Lavera Guise as a part of collaborative care agreement     Kendell Bane AGNP-C Internal medicine

## 2018-02-11 ENCOUNTER — Other Ambulatory Visit: Payer: Self-pay | Admitting: Nurse Practitioner

## 2018-02-11 ENCOUNTER — Encounter: Payer: Self-pay | Admitting: Nurse Practitioner

## 2018-02-11 DIAGNOSIS — R0602 Shortness of breath: Secondary | ICD-10-CM

## 2018-02-11 MED ORDER — ALBUTEROL SULFATE HFA 108 (90 BASE) MCG/ACT IN AERS: 2.0000 | INHALATION_SPRAY | Freq: Four times a day (QID) | RESPIRATORY_TRACT | 2 refills | Status: DC | PRN

## 2018-02-11 NOTE — Progress Notes (Signed)
Added albuterol rescue inhaler. May use 2 inhalations every 4 to 6 hours as needed for wheezing and shortness of breath.

## 2018-03-03 ENCOUNTER — Ambulatory Visit (INDEPENDENT_AMBULATORY_CARE_PROVIDER_SITE_OTHER): Payer: Medicare Other | Admitting: Nurse Practitioner

## 2018-03-03 ENCOUNTER — Ambulatory Visit
Admission: RE | Admit: 2018-03-03 | Discharge: 2018-03-03 | Disposition: A | Discharge status: Home / Self Care | Payer: Medicare Other | Source: Ambulatory Visit | Attending: Nurse Practitioner | Admitting: Nurse Practitioner

## 2018-03-03 ENCOUNTER — Encounter: Payer: Self-pay | Admitting: Nurse Practitioner

## 2018-03-03 VITALS — BP 132/75 | HR 88 | Temp 98.7°F | Resp 16 | Ht 70.0 in | Wt 200.6 lb

## 2018-03-03 DIAGNOSIS — I10 Essential (primary) hypertension: Secondary | ICD-10-CM

## 2018-03-03 DIAGNOSIS — R0602 Shortness of breath: Secondary | ICD-10-CM | POA: Insufficient documentation

## 2018-03-03 DIAGNOSIS — J181 Lobar pneumonia, unspecified organism: Secondary | ICD-10-CM | POA: Diagnosis not present

## 2018-03-03 DIAGNOSIS — J189 Pneumonia, unspecified organism: Secondary | ICD-10-CM | POA: Insufficient documentation

## 2018-03-03 DIAGNOSIS — R05 Cough: Secondary | ICD-10-CM | POA: Diagnosis not present

## 2018-03-03 MED ORDER — METHYLPREDNISOLONE 4 MG PO TBPK: ORAL_TABLET | ORAL | 0 refills | Status: DC

## 2018-03-03 MED ORDER — LEVOFLOXACIN 500 MG PO TABS: 500.0000 mg | ORAL_TABLET | Freq: Every day | ORAL | 0 refills | Status: DC

## 2018-03-03 NOTE — Progress Notes (Signed)
[facility] [place], [place] [address]  Internal MEDICINE  Office Visit Note  Patient Name: Russell Sullivan  [id number]  [id number]  Date of Service: 03/03/2018   Pt is here for a sick visit.  Chief Complaint  Patient presents with  . Cough    pt has concerns that he still have pneumonia     The patient recently treated for left lower lobe pneumonia. He believes he took all of his antibiotics but not really sure. Did get injection of depo-medrol of 60mg  at his last visit here. He states he no longer has a fever. Still coughing and has shortness of breath. Feels very fatigued. States that he coughs a lot. Bringing up yellow/brown colored phlegm. Hard to sleep at night because he is coughing all the time.        Current Medication:  Outpatient Encounter Medications as of 03/03/2018  Medication Sig  . albuterol (PROVENTIL HFA;VENTOLIN HFA) 108 (90 Base) MCG/ACT inhaler Inhale 2 puffs into the lungs every 6 (six) hours as needed for wheezing or shortness of breath.  . meloxicam (MOBIC) 15 MG tablet Take by mouth.  . [DISCONTINUED] levofloxacin (LEVAQUIN) 500 MG tablet Take 1 tablet (500 mg total) by mouth daily.  . famotidine (PEPCID) 20 MG tablet Take 1 tablet (20 mg total) by mouth 2 (two) times daily for 7 days.  Marland Kitchen levofloxacin (LEVAQUIN) 500 MG tablet Take 1 tablet (500 mg total) by mouth daily.  . methylPREDNISolone (MEDROL) 4 MG TBPK tablet Take by mouth as directed for 6 days  . predniSONE (STERAPRED UNI-PAK 21 TAB) 10 MG (21) TBPK tablet 6 tabs day 1 and taper 10 mg a day - 6 days (Patient not taking: Reported on 02/09/2018)   No facility-administered encounter medications on file as of 03/03/2018.       Medical History: Past Medical History:  Diagnosis Date  . Hyperlipidemia   . Hypertension      Vital Signs: BP 132/75 (BP Location: Left Arm, Patient Position: Sitting, Cuff Size: Large)   Pulse 88   Temp 98.7 F (37.1 C)   Resp  16   Ht 5\' 10"  (1.778 m)   Wt 200 lb 9.6 oz (91 kg)   SpO2 97%   BMI 28.78 kg/m    Review of Systems  Constitutional: Positive for fatigue. Negative for activity change, chills and unexpected weight change.  HENT: Positive for congestion. Negative for rhinorrhea, sneezing and sore throat.   Respiratory: Positive for cough, chest tightness and shortness of breath.   Cardiovascular: Negative.  Negative for chest pain and palpitations.  Gastrointestinal: Negative for abdominal pain, constipation, diarrhea, nausea and vomiting.  Genitourinary: Negative.  Negative for dysuria and frequency.  Musculoskeletal: Positive for arthralgias. Negative for back pain, joint swelling and neck pain.  Skin: Negative for rash.  Allergic/Immunologic: Positive for environmental allergies.  Neurological: Positive for headaches. Negative for dizziness, tremors and numbness.  Hematological: Negative for adenopathy. Does not bruise/bleed easily.  Psychiatric/Behavioral: Negative for behavioral problems, sleep disturbance and suicidal ideas. The patient is not nervous/anxious.     Physical Exam  Constitutional: He is oriented to person, place, and time. He appears well-developed and well-nourished. No distress.  HENT:  Head: Normocephalic and atraumatic.  Nose: Nose normal.  Mouth/Throat: No oropharyngeal exudate.  Eyes: Pupils are equal, round, and reactive to light. EOM are normal.  Neck: Normal range of motion. Neck supple. No JVD present. No tracheal deviation present. No thyromegaly present.  Cardiovascular:  Normal rate, regular rhythm and normal heart sounds. Exam reveals no gallop and no friction rub.  No murmur heard. Pulmonary/Chest: Effort normal. No respiratory distress. He has wheezes. He has no rales. He exhibits no tenderness.  Congested, non-productive cough in the office.   Musculoskeletal: Normal range of motion.  Lymphadenopathy:    He has no cervical adenopathy.  Neurological: He is  alert and oriented to person, place, and time. No cranial nerve deficit.  Skin: Skin is warm and dry. He is not diaphoretic.  Psychiatric: He has a normal mood and affect. His behavior is normal. Judgment and thought content normal.  Nursing note and vitals reviewed.  Assessment/Plan: 1. Pneumonia of left lower lobe due to infectious organism ([place]) Repeat levaquin 500mg  daily for 10 days. Repeat chest x-ray to evaluate for clearance. Follow up as scheduled.  - DG Chest 2 View; Future - levofloxacin (LEVAQUIN) 500 MG tablet; Take 1 tablet (500 mg total) by mouth daily.  Dispense: 10 tablet; Refill: 0  2. SOB (shortness of breath) Medrol dose pack. Take as directed for 6 days. Continue to use rescue inhaler as needed and as prescribed.  - methylPREDNISolone (MEDROL) 4 MG TBPK tablet; Take by mouth as directed for 6 days  Dispense: 21 tablet; Refill: 0  3. Essential (primary) hypertension Continue bp medication as prescribed .  General Counseling: Russell Sullivan verbalizes understanding of the findings of todays visit and agrees with plan of treatment. I have discussed any further diagnostic evaluation that may be needed or ordered today. We also reviewed his medications today. he has been encouraged to call the office with any questions or concerns that should arise related to todays visit.    Counseling:  Rest and increase fluids. Continue using OTC medication to control symptoms.   This patient was seen by Leretha Pol FNP Collaboration with Dr Lavera Guise as a part of collaborative care agreement  Orders Placed This Encounter  Procedures  . DG Chest 2 View    Meds ordered this encounter  Medications  . methylPREDNISolone (MEDROL) 4 MG TBPK tablet    Sig: Take by mouth as directed for 6 days    Dispense:  21 tablet    Refill:  0    Order Specific Question:   Supervising Provider    Answer:   Lavera Guise [place]  . levofloxacin (LEVAQUIN) 500 MG tablet    Sig: Take 1 tablet (500  mg total) by mouth daily.    Dispense:  10 tablet    Refill:  0    Order Specific Question:   Supervising Provider    Answer:   Lavera Guise [[id number]]    Time spent: 25 Minutes

## 2018-03-13 ENCOUNTER — Encounter: Payer: Self-pay | Admitting: Nurse Practitioner

## 2018-03-13 ENCOUNTER — Ambulatory Visit (INDEPENDENT_AMBULATORY_CARE_PROVIDER_SITE_OTHER): Payer: Medicare Other | Admitting: Nurse Practitioner

## 2018-03-13 VITALS — BP 149/87 | HR 91 | Resp 16 | Ht 70.0 in | Wt 204.8 lb

## 2018-03-13 DIAGNOSIS — K219 Gastro-esophageal reflux disease without esophagitis: Secondary | ICD-10-CM | POA: Diagnosis not present

## 2018-03-13 DIAGNOSIS — M150 Primary generalized (osteo)arthritis: Secondary | ICD-10-CM

## 2018-03-13 DIAGNOSIS — J181 Lobar pneumonia, unspecified organism: Secondary | ICD-10-CM

## 2018-03-13 DIAGNOSIS — J189 Pneumonia, unspecified organism: Secondary | ICD-10-CM

## 2018-03-13 MED ORDER — HYDROCODONE-ACETAMINOPHEN 7.5-325 MG PO TABS: 1.0000 | ORAL_TABLET | Freq: Two times a day (BID) | ORAL | 0 refills | Status: DC

## 2018-03-13 MED ORDER — AZITHROMYCIN 250 MG PO TABS: ORAL_TABLET | ORAL | 0 refills | Status: DC

## 2018-03-13 MED ORDER — MELOXICAM 15 MG PO TABS: 15.0000 mg | ORAL_TABLET | Freq: Every day | ORAL | 3 refills | Status: DC

## 2018-03-13 NOTE — Progress Notes (Signed)
[place], [place] [address]  Internal MEDICINE  Office Visit Note  Patient Name: Russell Sullivan  [id number]  [id number]  Date of Service: 03/13/2018  Chief Complaint  Patient presents with  . Medical Management of Chronic Issues    55month follow up    The patient recently treated for left lower lobe pneumonia. He believes he took all of his antibiotics but not really sure. He has completed a second round of antibiotics and six day taper of medrol. He states that he is feeling much better. Has more energy. Not coughing any longer. States that he still has some pain in left lower chest when taking a deep breath.  He continues to have lower back pain. Hid back pain is chronic in nature. He is physically active. states that his back will completely "go out" on him when he is working. has been taking tylenol arthritis 650mg  tablets in addition to tramadol and hydrocodone due to back and leg pain being worse since weather has been cold and damp. will take a tramadol 50mg  tablets when pain gets to be 6/10 in severity. Will take hydrocodone/APAP 7.5/325mg  twice daily, when pain reaches 8/10 in severity. Brings his pain down to 4/10 in severity. Helps to keep him active and allows him to participate in ADLs. He will take his tramadol in between the doses of hydrocodone so an extra dose of the hydrocodone is not needed. is having increased nocturia along with hesitancy.      Current Medication: Outpatient Encounter Medications as of 03/13/2018  Medication Sig  . albuterol (PROVENTIL HFA;VENTOLIN HFA) 108 (90 Base) MCG/ACT inhaler Inhale 2 puffs into the lungs every 6 (six) hours as needed for wheezing or shortness of breath.  Marland Kitchen aspirin EC 81 MG tablet Take 1 tablet by mouth daily.  Marland Kitchen HYDROcodone-acetaminophen (NORCO) 7.5-325 MG tablet Take 1 tablet by mouth 2 (two) times daily.  . meloxicam (MOBIC) 15 MG tablet Take 1 tablet (15 mg total) by mouth daily.  .  traMADol (ULTRAM) 50 MG tablet Take 50 mg by mouth 2 (two) times daily as needed.  . [DISCONTINUED] HYDROcodone-acetaminophen (NORCO) 7.5-325 MG tablet Take 1 tablet by mouth 2 (two) times daily.  . [DISCONTINUED] HYDROcodone-acetaminophen (NORCO) 7.5-325 MG tablet Take 1 tablet by mouth 2 (two) times daily.  . [DISCONTINUED] HYDROcodone-acetaminophen (NORCO) 7.5-325 MG tablet Take 1 tablet by mouth 2 (two) times daily.  . [DISCONTINUED] meloxicam (MOBIC) 15 MG tablet Take by mouth.  Marland Kitchen azithromycin (ZITHROMAX) 250 MG tablet z-pack - take as directed for 5 days  . famotidine (PEPCID) 20 MG tablet Take 1 tablet (20 mg total) by mouth 2 (two) times daily for 7 days.  . pantoprazole (PROTONIX) 40 MG tablet Take 40 mg by mouth daily.  . [DISCONTINUED] levofloxacin (LEVAQUIN) 500 MG tablet Take 1 tablet (500 mg total) by mouth daily. (Patient not taking: Reported on 03/13/2018)  . [DISCONTINUED] methylPREDNISolone (MEDROL) 4 MG TBPK tablet Take by mouth as directed for 6 days (Patient not taking: Reported on 03/13/2018)  . [DISCONTINUED] predniSONE (STERAPRED UNI-PAK 21 TAB) 10 MG (21) TBPK tablet 6 tabs day 1 and taper 10 mg a day - 6 days (Patient not taking: Reported on 02/09/2018)   No facility-administered encounter medications on file as of 03/13/2018.     Surgical History: Past Surgical History:  Procedure Laterality Date  . HERNIA REPAIR  2009  . INTUBATION-ENDOTRACHEAL WITH TRACHEOSTOMY STANDBY N/A 01/13/2018   Procedure: INTUBATION-ENDOTRACHEAL WITH TRACHEOSTOMY STANDBY;  Surgeon: Beverly Gust, MD;  Location: [facility] ORS;  Service: ENT;  Laterality: N/A;  . left shoulder surgery      Medical History: Past Medical History:  Diagnosis Date  . Hyperlipidemia   . Hypertension     Family History: Family History  Problem Relation Age of Onset  . Hypertension Mother   . Diabetes Mother   . Hypertension Father   . Diabetes Maternal Grandmother   . Diabetes Paternal Grandmother      Social History   Socioeconomic History  . Marital status: Married    Spouse name: Not on file  . Number of children: Not on file  . Years of education: Not on file  . Highest education level: Not on file  Occupational History  . Not on file  Social Needs  . Financial resource strain: Not on file  . Food insecurity:    Worry: Not on file    Inability: Not on file  . Transportation needs:    Medical: Not on file    Non-medical: Not on file  Tobacco Use  . Smoking status: Never Smoker  . Smokeless tobacco: Never Used  Substance and Sexual Activity  . Alcohol use: No    Frequency: Never  . Drug use: No  . Sexual activity: Not on file  Lifestyle  . Physical activity:    Days per week: Not on file    Minutes per session: Not on file  . Stress: Not on file  Relationships  . Social connections:    Talks on phone: Not on file    Gets together: Not on file    Attends religious service: Not on file    Active member of club or organization: Not on file    Attends meetings of clubs or organizations: Not on file    Relationship status: Not on file  . Intimate partner violence:    Fear of current or ex partner: Not on file    Emotionally abused: Not on file    Physically abused: Not on file    Forced sexual activity: Not on file  Other Topics Concern  . Not on file  Social History Narrative  . Not on file      Review of Systems  Constitutional: Negative for activity change, chills, fatigue and unexpected weight change.  HENT: Positive for congestion. Negative for rhinorrhea, sneezing and sore throat.   Respiratory: Positive for shortness of breath. Negative for cough and chest tightness.        Improved since his last visit   Cardiovascular: Negative for chest pain and palpitations.  Gastrointestinal: Negative for abdominal pain, constipation, diarrhea, nausea and vomiting.  Genitourinary: Negative.  Negative for dysuria and frequency.  Musculoskeletal: Positive for  arthralgias and back pain. Negative for joint swelling and neck pain.  Skin: Negative for rash.  Allergic/Immunologic: Positive for environmental allergies.  Neurological: Positive for headaches. Negative for dizziness, tremors and numbness.  Hematological: Negative for adenopathy. Does not bruise/bleed easily.  Psychiatric/Behavioral: Negative for behavioral problems, sleep disturbance and suicidal ideas. The patient is not nervous/anxious.    Today's Vitals   03/13/18 0858  BP: (!) 149/87  Pulse: 91  Resp: 16  SpO2: 97%  Weight: 204 lb 12.8 oz (92.9 kg)  Height: 5\' 10"  (1.778 m)    Physical Exam  Constitutional: He is oriented to person, place, and time. He appears well-developed and well-nourished. No distress.  HENT:  Head: Normocephalic and atraumatic.  Nose: Nose normal.  Mouth/Throat: Oropharynx  is clear and moist. No oropharyngeal exudate.  Eyes: Pupils are equal, round, and reactive to light. Conjunctivae and EOM are normal.  Neck: Normal range of motion. Neck supple. No JVD present. Carotid bruit is not present. No tracheal deviation present. No thyromegaly present.  Cardiovascular: Normal rate, regular rhythm, normal heart sounds and intact distal pulses. Exam reveals no gallop and no friction rub.  No murmur heard. Pulmonary/Chest: Effort normal. No respiratory distress. He has wheezes. He has no rales. He exhibits no tenderness.  Congested breath sounds in left lower lobe of the lungs.  Abdominal: Soft. Bowel sounds are normal. There is no tenderness. There is no guarding.  Musculoskeletal: Normal range of motion.  oderate lower back pain, worse with bending and twisting at the waist. Worse with pushing, pulling, and lifting with the arms. No visible or palpable abnormalities or deformities noted today.   Lymphadenopathy:    He has no cervical adenopathy.  Neurological: He is alert and oriented to person, place, and time. No cranial nerve deficit.  Skin: Skin is warm  and dry. Capillary refill takes less than 2 seconds. He is not diaphoretic.  Psychiatric: He has a normal mood and affect. His behavior is normal. Judgment and thought content normal.  Nursing note and vitals reviewed.  Assessment/Plan: 1. Pneumonia of left lower lobe due to infectious organism ([place]) Improving. z-pack. Take as directed for 5 days. Continue to rest and increase fluids.  - azithromycin (ZITHROMAX) 250 MG tablet; z-pack - take as directed for 5 days  Dispense: 6 tablet; Refill: 0  2. Primary generalized (osteo)arthritis May continue meloxicam 15mg  daily to reduce pain and inflammation. May continue hydrocodone/APAP 7.5/325mg  tablets twice daily as needed for more severe pain. Three 30 day prescriptions provided to his pharmacy. Dates 03/13/2018, 04/10/2018, 04/29/2018. - HYDROcodone-acetaminophen (NORCO) 7.5-325 MG tablet; Take 1 tablet by mouth 2 (two) times daily.  Dispense: 30 tablet; Refill: 0 - meloxicam (MOBIC) 15 MG tablet; Take 1 tablet (15 mg total) by mouth daily.  Dispense: 90 tablet; Refill: 3  3. Gastro-esophageal reflux disease without esophagitis Continue pantoprazole as prescribed .  General Counseling: Mehki verbalizes understanding of the findings of todays visit and agrees with plan of treatment. I have discussed any further diagnostic evaluation that may be needed or ordered today. We also reviewed his medications today. he has been encouraged to call the office with any questions or concerns that should arise related to todays visit.  Reviewed risks and possible side effects associated with taking opiates, benzodiazepines and other CNS depressants. Combination of these could cause dizziness and drowsiness. Advised patient not to drive or operate machinery when taking these medications, as patient's and other's life can be at risk and will have consequences. Patient verbalized understanding in this matter. Dependence and abuse for these drugs will be monitored  closely. A Controlled substance policy and procedure is on file which allows [facility] to order a urine drug screen test at any visit. Patient understands and agrees with the plan  This patient was seen by Leretha Pol FNP Collaboration with Dr Lavera Guise as a part of collaborative care agreement  Meds ordered this encounter  Medications  . azithromycin (ZITHROMAX) 250 MG tablet    Sig: z-pack - take as directed for 5 days    Dispense:  6 tablet    Refill:  0    Order Specific Question:   Supervising Provider    Answer:   Lavera Guise [facility]  .  DISCONTD: HYDROcodone-acetaminophen (NORCO) 7.5-325 MG tablet    Sig: Take 1 tablet by mouth 2 (two) times daily.    Dispense:  30 tablet    Refill:  0    Order Specific Question:   Supervising Provider    Answer:   Lavera Guise [[id number]]  . DISCONTD: HYDROcodone-acetaminophen (NORCO) 7.5-325 MG tablet    Sig: Take 1 tablet by mouth 2 (two) times daily.    Dispense:  30 tablet    Refill:  0    Fill after 04/10/2018    Order Specific Question:   Supervising Provider    Answer:   Lavera Guise [[id number]]  . HYDROcodone-acetaminophen (NORCO) 7.5-325 MG tablet    Sig: Take 1 tablet by mouth 2 (two) times daily.    Dispense:  30 tablet    Refill:  0    Fill after 05/09/2018    Order Specific Question:   Supervising Provider    Answer:   Lavera Guise [[id number]]  . meloxicam (MOBIC) 15 MG tablet    Sig: Take 1 tablet (15 mg total) by mouth daily.    Dispense:  90 tablet    Refill:  3    Please fill as 90 day prescription when needed    Order Specific Question:   Supervising Provider    Answer:   Lavera Guise [[id number]]    Time spent:25 Minutes      Dr Lavera Guise Internal medicine

## 2018-03-15 ENCOUNTER — Other Ambulatory Visit: Payer: Self-pay | Admitting: Nurse Practitioner

## 2018-03-15 ENCOUNTER — Telehealth: Payer: Self-pay

## 2018-03-15 DIAGNOSIS — M150 Primary generalized (osteo)arthritis: Secondary | ICD-10-CM

## 2018-03-15 MED ORDER — HYDROCODONE-ACETAMINOPHEN 7.5-325 MG PO TABS: 1.0000 | ORAL_TABLET | Freq: Two times a day (BID) | ORAL | 0 refills | Status: DC

## 2018-03-15 NOTE — Telephone Encounter (Signed)
Informed pt new rx was sent to pharmacy for hydrocodone.

## 2018-03-15 NOTE — Telephone Encounter (Signed)
amended prescriptions for hydrocodone/APAP BID. Should be for #60 tablets. Resent three 30 prescriptions to his pharmacy.

## 2018-03-15 NOTE — Progress Notes (Signed)
amended prescriptions for hydrocodone/APAP BID. Should be for #60 tablets. Resent three 30 prescriptions. Dates are 03/15/2018, 04/06/2018, and 05/09/2018.

## 2018-04-20 ENCOUNTER — Telehealth: Payer: Self-pay

## 2018-04-20 ENCOUNTER — Other Ambulatory Visit: Payer: Self-pay | Admitting: Nurse Practitioner

## 2018-04-20 DIAGNOSIS — J069 Acute upper respiratory infection, unspecified: Secondary | ICD-10-CM

## 2018-04-20 DIAGNOSIS — J101 Influenza due to other identified influenza virus with other respiratory manifestations: Secondary | ICD-10-CM

## 2018-04-20 DIAGNOSIS — J189 Pneumonia, unspecified organism: Secondary | ICD-10-CM

## 2018-04-20 DIAGNOSIS — J181 Lobar pneumonia, unspecified organism: Secondary | ICD-10-CM

## 2018-04-20 MED ORDER — OSELTAMIVIR PHOSPHATE 75 MG PO CAPS: 75.0000 mg | ORAL_CAPSULE | Freq: Two times a day (BID) | ORAL | 0 refills | Status: DC

## 2018-04-20 MED ORDER — AZITHROMYCIN 250 MG PO TABS: ORAL_TABLET | ORAL | 0 refills | Status: DC

## 2018-04-20 NOTE — Telephone Encounter (Signed)
PT WAS NOTIFIED. PT DID NOT NEED Z-PACK BUT HE BELIEVES HE IS STILL HAVING SOME SYMPTOMS POST PNEUMONIA. SPOKE WITH NIMISHA AND ADVISED HE CAN TAKE BOTH Z-PAK AND TAMIFLU.

## 2018-04-20 NOTE — Telephone Encounter (Signed)
Patient states diagnosed with flu. Sent in prescription for z-pack and tamiflu to [facility] in [place].

## 2018-04-20 NOTE — Progress Notes (Signed)
Patient states diagnosed with flu. Sent in prescription for z-pack and tamiflu to [facility] in [place].

## 2018-04-24 ENCOUNTER — Encounter: Payer: Self-pay | Admitting: Nurse Practitioner

## 2018-04-24 ENCOUNTER — Ambulatory Visit (INDEPENDENT_AMBULATORY_CARE_PROVIDER_SITE_OTHER): Payer: Medicare HMO | Admitting: Nurse Practitioner

## 2018-04-24 VITALS — BP 145/86 | HR 77 | Temp 98.2°F | Resp 16 | Ht 71.0 in | Wt 213.6 lb

## 2018-04-24 DIAGNOSIS — I10 Essential (primary) hypertension: Secondary | ICD-10-CM

## 2018-04-24 DIAGNOSIS — B370 Candidal stomatitis: Secondary | ICD-10-CM | POA: Diagnosis not present

## 2018-04-24 MED ORDER — NYSTATIN 100000 UNIT/ML MT SUSP: OROMUCOSAL | 1 refills | Status: DC

## 2018-04-24 NOTE — Progress Notes (Signed)
[facility] [place], [place] [address]  Internal MEDICINE  Office Visit Note  Patient Name: Russell Sullivan  [id number]  [id number]  Date of Service: 04/24/2018  Chief Complaint  Patient presents with  . Rash    pt has thrush,  pt states throat started swelling friday evening, feels like sand paper     The patient is here for sick visit. States that on Friday, he noted his lymph nodes were swollen. Then noted burning and pain in the tongue. Felt like he burned it, though maybe he burned it using inhaler. Gradually became worse and saw white plaque on his tongue. Has been rinsing out his mouth with mouthwash and brusing teeth and tongue and cannot get rid of this white plaque.   Pt is here for a sick visit.     Current Medication:  Outpatient Encounter Medications as of 04/24/2018  Medication Sig  . albuterol (PROVENTIL HFA;VENTOLIN HFA) 108 (90 Base) MCG/ACT inhaler Inhale 2 puffs into the lungs every 6 (six) hours as needed for wheezing or shortness of breath.  Marland Kitchen aspirin EC 81 MG tablet Take 1 tablet by mouth daily.  Marland Kitchen HYDROcodone-acetaminophen (NORCO) 7.5-325 MG tablet Take 1 tablet by mouth 2 (two) times daily.  . meloxicam (MOBIC) 15 MG tablet Take 1 tablet (15 mg total) by mouth daily.  . pantoprazole (PROTONIX) 40 MG tablet Take 40 mg by mouth daily.  . traMADol (ULTRAM) 50 MG tablet Take 50 mg by mouth 2 (two) times daily as needed.  Marland Kitchen azithromycin (ZITHROMAX) 250 MG tablet z-pack - take as directed for 5 days (Patient not taking: Reported on 04/24/2018)  . famotidine (PEPCID) 20 MG tablet Take 1 tablet (20 mg total) by mouth 2 (two) times daily for 7 days.  Marland Kitchen nystatin (MYCOSTATIN) 100000 UNIT/ML suspension Swish and swallow 54mls QID  . oseltamivir (TAMIFLU) 75 MG capsule Take 1 capsule (75 mg total) by mouth 2 (two) times daily. (Patient not taking: Reported on 04/24/2018)   No facility-administered encounter medications on file as of 04/24/2018.        Medical History: Past Medical History:  Diagnosis Date  . Hyperlipidemia   . Hypertension      Today's Vitals   04/24/18 1104  BP: (!) 145/86  Pulse: 77  Resp: 16  Temp: 98.2 F (36.8 C)  SpO2: 96%  Weight: 213 lb 9.6 oz (96.9 kg)  Height: 5\' 11"  (1.803 m)    Review of Systems  Constitutional: Negative for activity change, chills, fatigue and unexpected weight change.  HENT: Negative for congestion, postnasal drip, rhinorrhea, sneezing and sore throat.        Plaques of thrush on the tongue. Feels like he burned the tongue.   Respiratory: Negative for cough, chest tightness, shortness of breath and wheezing.   Cardiovascular: Negative for chest pain and palpitations.  Gastrointestinal: Negative for abdominal pain, constipation, diarrhea, nausea and vomiting.  Endocrine: Negative for cold intolerance, heat intolerance, polydipsia and polyuria.  Genitourinary: Negative for dysuria and frequency.  Musculoskeletal: Negative for arthralgias, back pain, joint swelling and neck pain.  Skin: Negative for rash.  Allergic/Immunologic: Negative for environmental allergies.  Neurological: Negative for dizziness, tremors, numbness and headaches.  Hematological: Negative for adenopathy. Does not bruise/bleed easily.  Psychiatric/Behavioral: Negative for behavioral problems (Depression), sleep disturbance and suicidal ideas. The patient is not nervous/anxious.     Physical Exam Vitals signs and nursing note reviewed.  Constitutional:      General: He is  not in acute distress.    Appearance: Normal appearance. He is well-developed. He is not diaphoretic.  HENT:     Head: Normocephalic and atraumatic.     Mouth/Throat:     Tongue: Lesions present.     Pharynx: No oropharyngeal exudate.     Comments: There are white plaques of thrush covering the tongue.  Eyes:     Pupils: Pupils are equal, round, and reactive to light.  Neck:     Musculoskeletal: Normal range of motion and  neck supple.     Thyroid: No thyromegaly.     Vascular: No JVD.     Trachea: No tracheal deviation.  Cardiovascular:     Rate and Rhythm: Normal rate and regular rhythm.     Heart sounds: Normal heart sounds. No murmur. No friction rub. No gallop.   Pulmonary:     Effort: Pulmonary effort is normal. No respiratory distress.     Breath sounds: Normal breath sounds. No wheezing or rales.  Chest:     Chest wall: No tenderness.  Abdominal:     General: Bowel sounds are normal.     Palpations: Abdomen is soft.  Lymphadenopathy:     Cervical: Cervical adenopathy present.  Skin:    General: Skin is warm and dry.  Neurological:     Mental Status: He is alert and oriented to person, place, and time.     Cranial Nerves: No cranial nerve deficit.  Psychiatric:        Behavior: Behavior normal.        Thought Content: Thought content normal.        Judgment: Judgment normal.    Assessment/Plan:  1. Oral candidiasis Start nystatin oral rinse. Swish andswallow 82mls four times daily for the next week and then as needed.  - nystatin (MYCOSTATIN) 100000 UNIT/ML suspension; Swish and swallow 5mls QID  Dispense: 200 mL; Refill: 1  2. Essential (primary) hypertension bp slightly elevated today. Continue all BP medication as prescribed.   General Counseling: Jazziel verbalizes understanding of the findings of todays visit and agrees with plan of treatment. I have discussed any further diagnostic evaluation that may be needed or ordered today. We also reviewed his medications today. he has been encouraged to call the office with any questions or concerns that should arise related to todays visit.    Counseling:  This patient was seen by [place] with Dr Lavera Guise as a part of collaborative care agreement  Meds ordered this encounter  Medications  . nystatin (MYCOSTATIN) 100000 UNIT/ML suspension    Sig: Swish and swallow 36mls QID    Dispense:  200 mL    Refill:  1     Order Specific Question:   Supervising Provider    Answer:   Lavera Guise [[id number]]    Time spent: 15 Minutes

## 2018-05-05 ENCOUNTER — Telehealth: Payer: Self-pay

## 2018-05-05 ENCOUNTER — Other Ambulatory Visit: Payer: Self-pay | Admitting: Nurse Practitioner

## 2018-05-05 ENCOUNTER — Other Ambulatory Visit: Payer: Self-pay

## 2018-05-05 DIAGNOSIS — B370 Candidal stomatitis: Secondary | ICD-10-CM

## 2018-05-05 MED ORDER — FLUCONAZOLE 150 MG PO TABS: ORAL_TABLET | ORAL | 0 refills | Status: DC

## 2018-05-05 MED ORDER — ITRACONAZOLE 10 MG/ML PO SOLN: 200.0000 mg | Freq: Every day | ORAL | 0 refills | Status: DC

## 2018-05-05 NOTE — Telephone Encounter (Signed)
Spoke with phar and canceled  Sporanox and send diflucan

## 2018-05-05 NOTE — Progress Notes (Signed)
Nystatin swish and swallow and diflucan have not worked for oral thrush. Sent prescription for sporanox solution. Swish and swallow 62ml daily for 8 days. New prescription sent to pharmacy.

## 2018-05-05 NOTE — Telephone Encounter (Signed)
Nystatin swish and swallow and diflucan have not worked for oral thrush. Sent prescription for sporanox solution. Swish and swallow 62ml daily for 8 days. New prescription sent to pharmacy.

## 2018-06-13 DIAGNOSIS — R972 Elevated prostate specific antigen [PSA]: Secondary | ICD-10-CM | POA: Diagnosis not present

## 2018-06-13 DIAGNOSIS — N529 Male erectile dysfunction, unspecified: Secondary | ICD-10-CM | POA: Diagnosis not present

## 2018-06-13 DIAGNOSIS — Z125 Encounter for screening for malignant neoplasm of prostate: Secondary | ICD-10-CM | POA: Diagnosis not present

## 2018-06-16 ENCOUNTER — Ambulatory Visit (INDEPENDENT_AMBULATORY_CARE_PROVIDER_SITE_OTHER): Payer: Medicare HMO | Admitting: Nurse Practitioner

## 2018-06-16 ENCOUNTER — Ambulatory Visit
Admission: RE | Admit: 2018-06-16 | Discharge: 2018-06-16 | Disposition: A | Discharge status: Home / Self Care | Payer: Medicare HMO | Source: Ambulatory Visit | Attending: Nurse Practitioner | Admitting: Nurse Practitioner

## 2018-06-16 ENCOUNTER — Ambulatory Visit
Admission: RE | Admit: 2018-06-16 | Discharge: 2018-06-16 | Disposition: A | Discharge status: Home / Self Care | Payer: Medicare HMO | Attending: Nurse Practitioner | Admitting: Nurse Practitioner

## 2018-06-16 ENCOUNTER — Encounter: Payer: Self-pay | Admitting: Nurse Practitioner

## 2018-06-16 ENCOUNTER — Telehealth: Payer: Self-pay

## 2018-06-16 VITALS — BP 162/80 | HR 100 | Resp 16 | Ht 70.0 in | Wt 214.0 lb

## 2018-06-16 DIAGNOSIS — I10 Essential (primary) hypertension: Secondary | ICD-10-CM

## 2018-06-16 DIAGNOSIS — J189 Pneumonia, unspecified organism: Secondary | ICD-10-CM | POA: Diagnosis not present

## 2018-06-16 DIAGNOSIS — J069 Acute upper respiratory infection, unspecified: Secondary | ICD-10-CM | POA: Diagnosis not present

## 2018-06-16 DIAGNOSIS — J181 Lobar pneumonia, unspecified organism: Secondary | ICD-10-CM | POA: Insufficient documentation

## 2018-06-16 DIAGNOSIS — J309 Allergic rhinitis, unspecified: Secondary | ICD-10-CM | POA: Diagnosis not present

## 2018-06-16 DIAGNOSIS — M150 Primary generalized (osteo)arthritis: Secondary | ICD-10-CM

## 2018-06-16 MED ORDER — HYDROCODONE-ACETAMINOPHEN 7.5-325 MG PO TABS: 1.0000 | ORAL_TABLET | Freq: Two times a day (BID) | ORAL | 0 refills | Status: DC

## 2018-06-16 MED ORDER — MOMETASONE FUROATE 50 MCG/ACT NA SUSP: 2.0000 | Freq: Every day | NASAL | 11 refills | Status: DC

## 2018-06-16 MED ORDER — FLUNISOLIDE 25 MCG/ACT (0.025%) NA SOLN: 2.0000 | Freq: Two times a day (BID) | NASAL | 3 refills | Status: DC

## 2018-06-16 MED ORDER — CEFUROXIME AXETIL 500 MG PO TABS: 500.0000 mg | ORAL_TABLET | Freq: Two times a day (BID) | ORAL | 0 refills | Status: DC

## 2018-06-16 MED ORDER — MELOXICAM 15 MG PO TABS: 15.0000 mg | ORAL_TABLET | Freq: Every day | ORAL | 3 refills | Status: DC

## 2018-06-16 NOTE — Addendum Note (Signed)
Addended by: Leretha Pol on: 06/16/2018 01:10 PM   Modules accepted: Orders

## 2018-06-16 NOTE — Progress Notes (Signed)
Pt bp was 160/86 and at 150/75 pt is not on any  BP medication he said reaction benazepril-Hctz and hospital stopped

## 2018-06-16 NOTE — Progress Notes (Addendum)
[facility] [place], [place] [address]  Internal MEDICINE  Office Visit Note  Patient Name: Russell Sullivan  [id number]  [id number]  Date of Service: 06/16/2018  Chief Complaint  Patient presents with  . Hypertension  . Hyperlipidemia    He continues to have lower back pain. Hid back pain is chronic in nature. He is physically active. states that his back will completely "go out" on him when he is working. has been taking tylenol arthritis 650mg  tablets in addition to tramadol and hydrocodone due to back and leg pain being worse since weather has been cold and damp. will take a tramadol 50mg  tablets when pain gets to be 6/10 in severity. Will take hydrocodone/APAP 7.5/325mg  twice daily, when pain reaches 8/10 in severity. Brings his pain down to 4/10 in severity. Helps to keep him active and allows him to participate in ADLs. He will take his tramadol in between the doses of hydrocodone so an extra dose of the hydrocodone is not needed. is having increased nocturia along with hesitancy. The patient's blood pressure is elevated today. Has been off blood pressure medication since October. Had allergic reaction to something. ER provider believed it to be benazepril. Discontinued it then. Blood pressure has been relatively stable since then.  The patient also states that he continues to have congestion in the sinuses as well as cough which has been present, intermittently since he was treated for pneumonia a few months ago. Has had a few rounds or antibiotics.       Current Medication: Outpatient Encounter Medications as of 06/16/2018  Medication Sig  . albuterol (PROVENTIL HFA;VENTOLIN HFA) 108 (90 Base) MCG/ACT inhaler Inhale 2 puffs into the lungs every 6 (six) hours as needed for wheezing or shortness of breath.  Marland Kitchen aspirin EC 81 MG tablet Take 1 tablet by mouth daily.  . fluconazole (DIFLUCAN) 150 MG tablet Take 1 tab once and may repeat in 3 days if symptoms  .  HYDROcodone-acetaminophen (NORCO) 7.5-325 MG tablet Take 1 tablet by mouth 2 (two) times daily.  Marland Kitchen itraconazole (SPORANOX) 10 MG/ML solution Take 20 mLs (200 mg total) by mouth daily.  . meloxicam (MOBIC) 15 MG tablet Take 1 tablet (15 mg total) by mouth daily.  Marland Kitchen nystatin (MYCOSTATIN) 100000 UNIT/ML suspension Swish and swallow 70mls QID  . pantoprazole (PROTONIX) 40 MG tablet Take 40 mg by mouth daily.  . traMADol (ULTRAM) 50 MG tablet Take 50 mg by mouth 2 (two) times daily as needed.  . [DISCONTINUED] HYDROcodone-acetaminophen (NORCO) 7.5-325 MG tablet Take 1 tablet by mouth 2 (two) times daily.  . [DISCONTINUED] HYDROcodone-acetaminophen (NORCO) 7.5-325 MG tablet Take 1 tablet by mouth 2 (two) times daily.  . [DISCONTINUED] HYDROcodone-acetaminophen (NORCO) 7.5-325 MG tablet Take 1 tablet by mouth 2 (two) times daily.  . [DISCONTINUED] meloxicam (MOBIC) 15 MG tablet Take 1 tablet (15 mg total) by mouth daily.  Marland Kitchen azithromycin (ZITHROMAX) 250 MG tablet z-pack - take as directed for 5 days (Patient not taking: Reported on 06/16/2018)  . cefUROXime (CEFTIN) 500 MG tablet Take 1 tablet (500 mg total) by mouth 2 (two) times daily with a meal.  . famotidine (PEPCID) 20 MG tablet Take 1 tablet (20 mg total) by mouth 2 (two) times daily for 7 days.  . flunisolide (NASALIDE) 25 MCG/ACT (0.025%) SOLN Place 2 sprays into the nose 2 (two) times daily.  . mometasone (NASONEX) 50 MCG/ACT nasal spray Place 2 sprays into the nose daily.   No facility-administered  encounter medications on file as of 06/16/2018.     Surgical History: Past Surgical History:  Procedure Laterality Date  . HERNIA REPAIR  2009  . INTUBATION-ENDOTRACHEAL WITH TRACHEOSTOMY STANDBY N/A 01/13/2018   Procedure: INTUBATION-ENDOTRACHEAL WITH TRACHEOSTOMY STANDBY;  Surgeon: Beverly Gust, MD;  Location: [facility] ORS;  Service: ENT;  Laterality: N/A;  . left shoulder surgery      Medical History: Past Medical History:  Diagnosis Date  .  Hyperlipidemia   . Hypertension     Family History: Family History  Problem Relation Age of Onset  . Hypertension Mother   . Diabetes Mother   . Hypertension Father   . Diabetes Maternal Grandmother   . Diabetes Paternal Grandmother     Social History   Socioeconomic History  . Marital status: Married    Spouse name: Not on file  . Number of children: Not on file  . Years of education: Not on file  . Highest education level: Not on file  Occupational History  . Not on file  Social Needs  . Financial resource strain: Not on file  . Food insecurity:    Worry: Not on file    Inability: Not on file  . Transportation needs:    Medical: Not on file    Non-medical: Not on file  Tobacco Use  . Smoking status: Never Smoker  . Smokeless tobacco: Never Used  Substance and Sexual Activity  . Alcohol use: No    Frequency: Never  . Drug use: No  . Sexual activity: Not on file  Lifestyle  . Physical activity:    Days per week: Not on file    Minutes per session: Not on file  . Stress: Not on file  Relationships  . Social connections:    Talks on phone: Not on file    Gets together: Not on file    Attends religious service: Not on file    Active member of club or organization: Not on file    Attends meetings of clubs or organizations: Not on file    Relationship status: Not on file  . Intimate partner violence:    Fear of current or ex partner: Not on file    Emotionally abused: Not on file    Physically abused: Not on file    Forced sexual activity: Not on file  Other Topics Concern  . Not on file  Social History Narrative  . Not on file      Review of Systems  Constitutional: Positive for fatigue. Negative for activity change, chills, fever and unexpected weight change.  HENT: Positive for congestion. Negative for rhinorrhea, sneezing and sore throat.   Respiratory: Positive for cough and shortness of breath. Negative for chest tightness.   Cardiovascular:  Negative for chest pain and palpitations.       Elevated blood pressure  Gastrointestinal: Negative for abdominal pain, constipation, diarrhea, nausea and vomiting.  Endocrine: Negative for cold intolerance, heat intolerance, polydipsia and polyuria.  Musculoskeletal: Positive for arthralgias and back pain. Negative for joint swelling and neck pain.  Skin: Negative for rash.  Allergic/Immunologic: Positive for environmental allergies.  Neurological: Positive for headaches. Negative for dizziness, tremors and numbness.  Hematological: Negative for adenopathy. Does not bruise/bleed easily.  Psychiatric/Behavioral: Negative for behavioral problems, sleep disturbance and suicidal ideas. The patient is not nervous/anxious.     Today's Vitals   06/16/18 0830  BP: (!) 162/80  Pulse: 100  Resp: 16  SpO2: 97%  Weight: 214 lb (97.1  kg)  Height: 5\' 10"  (1.778 m)   Body mass index is 30.71 kg/m.  Physical Exam Vitals signs and nursing note reviewed.  Constitutional:      General: He is not in acute distress.    Appearance: Normal appearance. He is well-developed. He is not diaphoretic.  HENT:     Head: Normocephalic and atraumatic.     Nose: Nose normal.     Mouth/Throat:     Pharynx: No oropharyngeal exudate.  Eyes:     Conjunctiva/sclera: Conjunctivae normal.     Pupils: Pupils are equal, round, and reactive to light.  Neck:     Musculoskeletal: Normal range of motion and neck supple.     Thyroid: No thyromegaly.     Vascular: No carotid bruit or JVD.     Trachea: No tracheal deviation.  Cardiovascular:     Rate and Rhythm: Normal rate and regular rhythm.     Heart sounds: Normal heart sounds. No murmur. No friction rub. No gallop.   Pulmonary:     Effort: Pulmonary effort is normal. No respiratory distress.     Breath sounds: No rales.     Comments: Breath sounds are diminished, but clear throughout the lung fields.  Chest:     Chest wall: No tenderness.  Abdominal:      General: Bowel sounds are normal.     Palpations: Abdomen is soft.     Tenderness: There is no abdominal tenderness. There is no guarding.  Musculoskeletal: Normal range of motion.     Comments: oderate lower back pain, worse with bending and twisting at the waist. Worse with pushing, pulling, and lifting with the arms. No visible or palpable abnormalities or deformities noted today.   Lymphadenopathy:     Cervical: No cervical adenopathy.  Skin:    General: Skin is warm and dry.     Capillary Refill: Capillary refill takes less than 2 seconds.  Neurological:     Mental Status: He is alert and oriented to person, place, and time.     Cranial Nerves: No cranial nerve deficit.  Psychiatric:        Behavior: Behavior normal.        Thought Content: Thought content normal.        Judgment: Judgment normal.   Assessment/Plan: 1. Acute upper respiratory infection Start ceftin 500mg  bid for 10 days. Rest and increase fluids and use OTC medication to treat acute symptoms.  - cefUROXime (CEFTIN) 500 MG tablet; Take 1 tablet (500 mg total) by mouth 2 (two) times daily with a meal.  Dispense: 20 tablet; Refill: 0  2. Allergic rhinitis, unspecified seasonality, unspecified trigger Changed nasal spray to nasonex, using two sprays in both nostrils daily.  - flunisolide (NASALIDE) 25 MCG/ACT (0.025%) SOLN; Place 2 sprays into the nose 2 (two) times daily.  Dispense: 1 Bottle; Refill: 3  3. Pneumonia of left lower lobe due to infectious organism [facility]) Chest x-ray ordered to evaluate clearance of previously diagnosed pneumonia.  - DG Chest 2 View; Future  4. Primary generalized (osteo)arthritis Continue meloxicam 15mg  daily to reduce pain and inflammation. May continue to take hydrocodone/APAP 7.5/325mg  twice daily if needed for more severe pain. Three 30 day prescriptions provided. Dates are 06/16/2018, 07/15/2018, and 08/12/2018. - meloxicam (MOBIC) 15 MG tablet; Take 1 tablet (15 mg total) by mouth  daily.  Dispense: 90 tablet; Refill: 3 - HYDROcodone-acetaminophen (NORCO) 7.5-325 MG tablet; Take 1 tablet by mouth 2 (two) times daily.  Dispense: 60 tablet;  Refill: 0  5. Essential (primary) hypertension Continue to monitor closely. Reassess at next visit and add medication as indicated   General Counseling: Bandy verbalizes understanding of the findings of todays visit and agrees with plan of treatment. I have discussed any further diagnostic evaluation that may be needed or ordered today. We also reviewed his medications today. he has been encouraged to call the office with any questions or concerns that should arise related to todays visit.  Hypertension Counseling:   The following hypertensive lifestyle modification were recommended and discussed:  1. Limiting alcohol intake to less than 1 oz/day of ethanol:(24 oz of beer or 8 oz of wine or 2 oz of 100-proof whiskey). 2. Take baby ASA 81 mg daily. 3. Importance of regular aerobic exercise and losing weight. 4. Reduce dietary saturated fat and cholesterol intake for overall cardiovascular health. 5. Maintaining adequate dietary potassium, calcium, and magnesium intake. 6. Regular monitoring of the blood pressure. 7. Reduce sodium intake to less than 100 mmol/day (less than 2.3 gm of sodium or less than 6 gm of sodium choride)   Reviewed risks and possible side effects associated with taking opiates, benzodiazepines and other CNS depressants. Combination of these could cause dizziness and drowsiness. Advised patient not to drive or operate machinery when taking these medications, as patient's and other's life can be at risk and will have consequences. Patient verbalized understanding in this matter. Dependence and abuse for these drugs will be monitored closely. A Controlled substance policy and procedure is on file which allows [facility] to order a urine drug screen test at any visit. Patient understands and agrees with the  plan  This patient was seen by Leretha Pol FNP Collaboration with Dr Lavera Guise as a part of collaborative care agreement  Orders Placed This Encounter  Procedures  . DG Chest 2 View    Meds ordered this encounter  Medications  . meloxicam (MOBIC) 15 MG tablet    Sig: Take 1 tablet (15 mg total) by mouth daily.    Dispense:  90 tablet    Refill:  3    Please fill as 90 day prescription    Order Specific Question:   Supervising Provider    Answer:   Lavera Guise [place]  . cefUROXime (CEFTIN) 500 MG tablet    Sig: Take 1 tablet (500 mg total) by mouth 2 (two) times daily with a meal.    Dispense:  20 tablet    Refill:  0    Order Specific Question:   Supervising Provider    Answer:   Lavera Guise [place]  . flunisolide (NASALIDE) 25 MCG/ACT (0.025%) SOLN    Sig: Place 2 sprays into the nose 2 (two) times daily.    Dispense:  1 Bottle    Refill:  3    Order Specific Question:   Supervising Provider    Answer:   Lavera Guise [[id number]]  . DISCONTD: HYDROcodone-acetaminophen (NORCO) 7.5-325 MG tablet    Sig: Take 1 tablet by mouth 2 (two) times daily.    Dispense:  60 tablet    Refill:  0    Prescription is for Bid and should be #60 tablets.    Order Specific Question:   Supervising Provider    Answer:   Lavera Guise [[id number]]  . DISCONTD: HYDROcodone-acetaminophen (NORCO) 7.5-325 MG tablet    Sig: Take 1 tablet by mouth 2 (two) times daily.    Dispense:  60 tablet  Refill:  0    Prescription is for Bid and should be #60 tablets. Fill after 07/15/2018    Order Specific Question:   Supervising Provider    Answer:   Lavera Guise [[id number]]  . HYDROcodone-acetaminophen (NORCO) 7.5-325 MG tablet    Sig: Take 1 tablet by mouth 2 (two) times daily.    Dispense:  60 tablet    Refill:  0    Prescription is for Bid and should be #60 tablets. Fill after 08/12/2018    Order Specific Question:   Supervising Provider    Answer:   Lavera Guise [[id number]]  . mometasone (NASONEX) 50 MCG/ACT  nasal spray    Sig: Place 2 sprays into the nose daily.    Dispense:  17 g    Refill:  11    Order Specific Question:   Supervising Provider    Answer:   Lavera Guise [[id number]]    Time spent: 27 Minutes      Dr Lavera Guise Internal medicine

## 2018-06-16 NOTE — Telephone Encounter (Signed)
PT WAS NOTIFIED. 

## 2018-06-16 NOTE — Telephone Encounter (Signed)
I'm not sure if the cost will be different, however, I Changed nasal spray to nasonex, using two sprays in both nostrils daily. Sent new rx to [facility] for him.

## 2018-06-26 ENCOUNTER — Other Ambulatory Visit: Payer: Self-pay | Admitting: Nurse Practitioner

## 2018-06-26 ENCOUNTER — Telehealth: Payer: Self-pay

## 2018-06-26 DIAGNOSIS — M150 Primary generalized (osteo)arthritis: Secondary | ICD-10-CM

## 2018-06-26 MED ORDER — TRAMADOL HCL 50 MG PO TABS: 50.0000 mg | ORAL_TABLET | Freq: Two times a day (BID) | ORAL | 3 refills | Status: DC | PRN

## 2018-06-26 NOTE — Progress Notes (Signed)
Refilled patient's prescription for tramadol. Sent #30 with 3 refills per patient request.

## 2018-06-26 NOTE — Telephone Encounter (Signed)
Refilled patient's prescription for tramadol. Sent #30 with 3 refills per patient request.

## 2018-06-27 NOTE — Telephone Encounter (Signed)
Pt was notified.  

## 2018-07-06 ENCOUNTER — Other Ambulatory Visit: Payer: Self-pay

## 2018-07-06 MED ORDER — PANTOPRAZOLE SODIUM 40 MG PO TBEC: 40.0000 mg | DELAYED_RELEASE_TABLET | Freq: Every day | ORAL | 5 refills | Status: DC

## 2018-07-10 ENCOUNTER — Other Ambulatory Visit: Payer: Self-pay

## 2018-07-10 MED ORDER — FLUCONAZOLE 150 MG PO TABS: ORAL_TABLET | ORAL | 0 refills | Status: DC

## 2018-07-10 NOTE — Telephone Encounter (Signed)
Pt called that he still had yeast infection as per heather send diflucan

## 2018-09-21 ENCOUNTER — Encounter: Payer: Self-pay | Admitting: Nurse Practitioner

## 2018-09-21 ENCOUNTER — Other Ambulatory Visit: Payer: Self-pay

## 2018-09-21 ENCOUNTER — Ambulatory Visit (INDEPENDENT_AMBULATORY_CARE_PROVIDER_SITE_OTHER): Payer: Medicare HMO | Admitting: Nurse Practitioner

## 2018-09-21 VITALS — BP 133/82 | HR 72 | Ht 70.0 in | Wt 210.0 lb

## 2018-09-21 DIAGNOSIS — K219 Gastro-esophageal reflux disease without esophagitis: Secondary | ICD-10-CM | POA: Diagnosis not present

## 2018-09-21 DIAGNOSIS — E782 Mixed hyperlipidemia: Secondary | ICD-10-CM

## 2018-09-21 DIAGNOSIS — M150 Primary generalized (osteo)arthritis: Secondary | ICD-10-CM

## 2018-09-21 MED ORDER — PANTOPRAZOLE SODIUM 40 MG PO TBEC: 40.0000 mg | DELAYED_RELEASE_TABLET | Freq: Every day | ORAL | 5 refills | Status: DC

## 2018-09-21 MED ORDER — MELOXICAM 15 MG PO TABS: 15.0000 mg | ORAL_TABLET | Freq: Every day | ORAL | 3 refills | Status: DC

## 2018-09-21 MED ORDER — HYDROCODONE-ACETAMINOPHEN 7.5-325 MG PO TABS: 1.0000 | ORAL_TABLET | Freq: Two times a day (BID) | ORAL | 0 refills | Status: DC

## 2018-09-21 MED ORDER — TRAMADOL HCL 50 MG PO TABS: 50.0000 mg | ORAL_TABLET | Freq: Two times a day (BID) | ORAL | 3 refills | Status: DC | PRN

## 2018-09-21 NOTE — Progress Notes (Signed)
[facility] [place], [place] [address]  Internal MEDICINE  Telephone Visit  Patient Name: Russell Sullivan  [id number]  [id number]  Date of Service: 09/23/2018  I connected with the patient at 12:51pm by telephone and verified the patients identity using two identifiers.   I discussed the limitations, risks, security and privacy concerns of performing an evaluation and management service by telephone and the availability of in person appointments. I also discussed with the patient that there may be a patient responsible charge related to the service.  The patient expressed understanding and agrees to proceed.    Chief Complaint  Patient presents with  . Telephone Screen    PHONE VISIT [phone]  . Telephone Assessment  . Medical Management of Chronic Issues    3 month follow up  . Hypertension  . Hyperlipidemia    The patient has been contacted via telephone for follow up visit due to concerns for spread of novel coronavirus. States that he is doing well, overall. He continues to have lower back pain. Hid back pain is chronic in nature. He is physically active. states that his back will completely "go out" on him when he is working. has been taking tylenol arthritis 650mg  tablets in addition to tramadol and hydrocodone due to back and leg pain being worse since weather has been cold and damp. will take a tramadol 50mg  tablets when pain gets to be 6/10 in severity. Will take hydrocodone/APAP 7.5/325mg  twice daily, when pain reaches 8/10 in severity. Brings his pain down to 4/10 in severity. Helps to keep him active and allows him to participate in ADLs. He will take his tramadol in between the doses of hydrocodone so an extra dose of the hydrocodone is not needed.      Current Medication: Outpatient Encounter Medications as of 09/21/2018  Medication Sig  . albuterol (PROVENTIL HFA;VENTOLIN HFA) 108 (90 Base) MCG/ACT inhaler Inhale 2 puffs into the lungs every  6 (six) hours as needed for wheezing or shortness of breath.  Marland Kitchen aspirin EC 81 MG tablet Take 1 tablet by mouth daily.  . flunisolide (NASALIDE) 25 MCG/ACT (0.025%) SOLN Place 2 sprays into the nose 2 (two) times daily.  Marland Kitchen HYDROcodone-acetaminophen (NORCO) 7.5-325 MG tablet Take 1 tablet by mouth 2 (two) times daily.  . meloxicam (MOBIC) 15 MG tablet Take 1 tablet (15 mg total) by mouth daily.  . mometasone (NASONEX) 50 MCG/ACT nasal spray Place 2 sprays into the nose daily.  Marland Kitchen nystatin (MYCOSTATIN) 100000 UNIT/ML suspension Swish and swallow 63mls QID  . pantoprazole (PROTONIX) 40 MG tablet Take 1 tablet (40 mg total) by mouth daily.  . rosuvastatin (CRESTOR) 20 MG tablet Take 20 mg by mouth at bedtime.  . sildenafil (VIAGRA) 100 MG tablet Take by mouth.  . traMADol (ULTRAM) 50 MG tablet Take 1 tablet (50 mg total) by mouth 2 (two) times daily as needed.  . [DISCONTINUED] HYDROcodone-acetaminophen (NORCO) 7.5-325 MG tablet Take 1 tablet by mouth 2 (two) times daily.  . [DISCONTINUED] meloxicam (MOBIC) 15 MG tablet Take 1 tablet (15 mg total) by mouth daily.  . [DISCONTINUED] pantoprazole (PROTONIX) 40 MG tablet Take 1 tablet (40 mg total) by mouth daily.  . [DISCONTINUED] traMADol (ULTRAM) 50 MG tablet Take 1 tablet (50 mg total) by mouth 2 (two) times daily as needed.  . cefUROXime (CEFTIN) 500 MG tablet Take 1 tablet (500 mg total) by mouth 2 (two) times daily with a meal. (Patient not taking: Reported on 09/21/2018)  .  famotidine (PEPCID) 20 MG tablet Take 1 tablet (20 mg total) by mouth 2 (two) times daily for 7 days.  . fluconazole (DIFLUCAN) 150 MG tablet Take 1 tab once and may repeat in 3 days if symptoms (Patient not taking: Reported on 09/21/2018)  . itraconazole (SPORANOX) 10 MG/ML solution Take 20 mLs (200 mg total) by mouth daily. (Patient not taking: Reported on 09/21/2018)  . [DISCONTINUED] azithromycin (ZITHROMAX) 250 MG tablet z-pack - take as directed for 5 days (Patient not taking:  Reported on 06/16/2018)   No facility-administered encounter medications on file as of 09/21/2018.     Surgical History: Past Surgical History:  Procedure Laterality Date  . HERNIA REPAIR  2009  . INTUBATION-ENDOTRACHEAL WITH TRACHEOSTOMY STANDBY N/A 01/13/2018   Procedure: INTUBATION-ENDOTRACHEAL WITH TRACHEOSTOMY STANDBY;  Surgeon: Beverly Gust, MD;  Location: [facility] ORS;  Service: ENT;  Laterality: N/A;  . left shoulder surgery      Medical History: Past Medical History:  Diagnosis Date  . Hyperlipidemia   . Hypertension     Family History: Family History  Problem Relation Age of Onset  . Hypertension Mother   . Diabetes Mother   . Hypertension Father   . Diabetes Maternal Grandmother   . Diabetes Paternal Grandmother     Social History   Socioeconomic History  . Marital status: Married    Spouse name: Not on file  . Number of children: Not on file  . Years of education: Not on file  . Highest education level: Not on file  Occupational History  . Not on file  Social Needs  . Financial resource strain: Not on file  . Food insecurity    Worry: Not on file    Inability: Not on file  . Transportation needs    Medical: Not on file    Non-medical: Not on file  Tobacco Use  . Smoking status: Never Smoker  . Smokeless tobacco: Never Used  Substance and Sexual Activity  . Alcohol use: No    Frequency: Never  . Drug use: No  . Sexual activity: Not on file  Lifestyle  . Physical activity    Days per week: Not on file    Minutes per session: Not on file  . Stress: Not on file  Relationships  . Social Herbalist on phone: Not on file    Gets together: Not on file    Attends religious service: Not on file    Active member of club or organization: Not on file    Attends meetings of clubs or organizations: Not on file    Relationship status: Not on file  . Intimate partner violence    Fear of current or ex partner: Not on file    Emotionally abused:  Not on file    Physically abused: Not on file    Forced sexual activity: Not on file  Other Topics Concern  . Not on file  Social History Narrative  . Not on file      Review of Systems  Constitutional: Positive for fatigue. Negative for activity change, chills, fever and unexpected weight change.  HENT: Negative for congestion, rhinorrhea, sneezing and sore throat.   Respiratory: Negative for cough, chest tightness and shortness of breath.   Cardiovascular: Negative for chest pain and palpitations.  Gastrointestinal: Negative for abdominal pain, constipation, diarrhea, nausea and vomiting.  Endocrine: Negative for cold intolerance, heat intolerance, polydipsia and polyuria.  Musculoskeletal: Positive for arthralgias, back pain and myalgias. Negative  for joint swelling and neck pain.  Skin: Negative for rash.  Allergic/Immunologic: Positive for environmental allergies.  Neurological: Positive for headaches. Negative for dizziness, tremors and numbness.  Hematological: Negative for adenopathy. Does not bruise/bleed easily.  Psychiatric/Behavioral: Negative for behavioral problems, sleep disturbance and suicidal ideas. The patient is not nervous/anxious.     Today's Vitals   09/21/18 1139  BP: 133/82  Pulse: 72  Weight: 210 lb (95.3 kg)  Height: 5\' 10"  (1.778 m)   Body mass index is 30.13 kg/m.  Observation/Objective:   The patient is alert and oriented. He is pleasant and answering all questions appropriately. Breathing is non-labored. He is in no acute distress.   Assessment/Plan:  1. Gastro-esophageal reflux disease without esophagitis Stable. Continue pantoprazole as prescribed  - pantoprazole (PROTONIX) 40 MG tablet; Take 1 tablet (40 mg total) by mouth daily.  Dispense: 30 tablet; Refill: 5  2. Primary generalized (osteo)arthritis May continue meloxicam 15mg  daily to reduce pain and inflammation. Tramadol 50mg  may be taken up to twice daily for moderate pain.  Hydrocodone/APAP 7.5/325mg  tablets twice daily if needed for more severe pain. New prescriptions were sent to his pharmacy today.  - meloxicam (MOBIC) 15 MG tablet; Take 1 tablet (15 mg total) by mouth daily.  Dispense: 90 tablet; Refill: 3 - HYDROcodone-acetaminophen (NORCO) 7.5-325 MG tablet; Take 1 tablet by mouth 2 (two) times daily.  Dispense: 60 tablet; Refill: 0 - traMADol (ULTRAM) 50 MG tablet; Take 1 tablet (50 mg total) by mouth 2 (two) times daily as needed.  Dispense: 30 tablet; Refill: 3  3. Mixed hyperlipidemia Continue crestor as prescribed   General Counseling: Kaidin verbalizes understanding of the findings of today's phone visit and agrees with plan of treatment. I have discussed any further diagnostic evaluation that may be needed or ordered today. We also reviewed his medications today. he has been encouraged to call the office with any questions or concerns that should arise related to todays visit.  Reviewed risks and possible side effects associated with taking opiates, benzodiazepines and other CNS depressants. Combination of these could cause dizziness and drowsiness. Advised patient not to drive or operate machinery when taking these medications, as patient's and other's life can be at risk and will have consequences. Patient verbalized understanding in this matter. Dependence and abuse for these drugs will be monitored closely. A Controlled substance policy and procedure is on file which allows [facility] to order a urine drug screen test at any visit. Patient understands and agrees with the plan  This patient was seen by Leretha Pol FNP Collaboration with Dr Lavera Guise as a part of collaborative care agreement  Meds ordered this encounter  Medications  . pantoprazole (PROTONIX) 40 MG tablet    Sig: Take 1 tablet (40 mg total) by mouth daily.    Dispense:  30 tablet    Refill:  5    Order Specific Question:   Supervising Provider    Answer:   Lavera Guise [[id number]]  . meloxicam (MOBIC) 15 MG tablet    Sig: Take 1 tablet (15 mg total) by mouth daily.    Dispense:  90 tablet    Refill:  3    Please fill as 90 day prescription    Order Specific Question:   Supervising Provider    Answer:   Lavera Guise [place]  . HYDROcodone-acetaminophen (NORCO) 7.5-325 MG tablet    Sig: Take 1 tablet by mouth 2 (two) times daily.  Dispense:  60 tablet    Refill:  0    Prescription is for Bid and should be #60 tablets.    Order Specific Question:   Supervising Provider    Answer:   Lavera Guise [[id number]]  . traMADol (ULTRAM) 50 MG tablet    Sig: Take 1 tablet (50 mg total) by mouth 2 (two) times daily as needed.    Dispense:  30 tablet    Refill:  3    Order Specific Question:   Supervising Provider    Answer:   Lavera Guise [[id number]]    Time spent: 61 Minutes    Dr Lavera Guise Internal medicine

## 2018-10-20 ENCOUNTER — Encounter: Payer: Self-pay | Admitting: Nurse Practitioner

## 2018-10-22 ENCOUNTER — Other Ambulatory Visit: Payer: Self-pay | Admitting: Nurse Practitioner

## 2018-10-22 DIAGNOSIS — M150 Primary generalized (osteo)arthritis: Secondary | ICD-10-CM

## 2018-10-22 MED ORDER — HYDROCODONE-ACETAMINOPHEN 7.5-325 MG PO TABS: 1.0000 | ORAL_TABLET | Freq: Two times a day (BID) | ORAL | 0 refills | Status: DC

## 2018-10-22 NOTE — Progress Notes (Signed)
A new prescription for hydrocodone/APAP 7.5/325mg  twice daily as needed was sent to [facility] in [place].

## 2018-11-28 ENCOUNTER — Other Ambulatory Visit: Payer: Self-pay | Admitting: Nurse Practitioner

## 2018-11-28 ENCOUNTER — Encounter: Payer: Self-pay | Admitting: Nurse Practitioner

## 2018-11-28 ENCOUNTER — Telehealth: Payer: Self-pay

## 2018-11-28 DIAGNOSIS — S0101XA Laceration without foreign body of scalp, initial encounter: Secondary | ICD-10-CM | POA: Diagnosis not present

## 2018-11-28 DIAGNOSIS — M150 Primary generalized (osteo)arthritis: Secondary | ICD-10-CM

## 2018-11-28 DIAGNOSIS — M5030 Other cervical disc degeneration, unspecified cervical region: Secondary | ICD-10-CM | POA: Diagnosis not present

## 2018-11-28 DIAGNOSIS — Z87891 Personal history of nicotine dependence: Secondary | ICD-10-CM | POA: Diagnosis not present

## 2018-11-28 DIAGNOSIS — I10 Essential (primary) hypertension: Secondary | ICD-10-CM | POA: Diagnosis not present

## 2018-11-28 DIAGNOSIS — S1093XA Contusion of unspecified part of neck, initial encounter: Secondary | ICD-10-CM | POA: Diagnosis not present

## 2018-11-28 DIAGNOSIS — M542 Cervicalgia: Secondary | ICD-10-CM | POA: Diagnosis not present

## 2018-11-28 DIAGNOSIS — M1990 Unspecified osteoarthritis, unspecified site: Secondary | ICD-10-CM | POA: Diagnosis not present

## 2018-11-28 DIAGNOSIS — S0990XA Unspecified injury of head, initial encounter: Secondary | ICD-10-CM | POA: Diagnosis not present

## 2018-11-28 DIAGNOSIS — E785 Hyperlipidemia, unspecified: Secondary | ICD-10-CM | POA: Diagnosis not present

## 2018-11-28 MED ORDER — HYDROCODONE-ACETAMINOPHEN 7.5-325 MG PO TABS: 1.0000 | ORAL_TABLET | Freq: Two times a day (BID) | ORAL | 0 refills | Status: DC

## 2018-11-28 NOTE — Telephone Encounter (Signed)
Called phar and cancelled pres for hydrocodone and called pt and make appt for further refills

## 2018-11-28 NOTE — Telephone Encounter (Signed)
Can we discuss

## 2018-11-28 NOTE — Progress Notes (Signed)
Renewed hydrocodone/APAP 7.5/325mg  tablets twice daily if needed for pain. Sent to [facility] in [place].

## 2018-11-30 ENCOUNTER — Ambulatory Visit: Payer: Medicare HMO | Admitting: Adult Health

## 2018-12-01 ENCOUNTER — Ambulatory Visit (INDEPENDENT_AMBULATORY_CARE_PROVIDER_SITE_OTHER): Payer: Medicare HMO | Admitting: Nurse Practitioner

## 2018-12-01 ENCOUNTER — Other Ambulatory Visit: Payer: Self-pay

## 2018-12-01 ENCOUNTER — Encounter: Payer: Self-pay | Admitting: Nurse Practitioner

## 2018-12-01 VITALS — BP 150/87 | HR 105 | Resp 16 | Ht 70.0 in | Wt 218.0 lb

## 2018-12-01 DIAGNOSIS — I10 Essential (primary) hypertension: Secondary | ICD-10-CM

## 2018-12-01 DIAGNOSIS — S0181XD Laceration without foreign body of other part of head, subsequent encounter: Secondary | ICD-10-CM

## 2018-12-01 DIAGNOSIS — Z79899 Other long term (current) drug therapy: Secondary | ICD-10-CM

## 2018-12-01 DIAGNOSIS — M150 Primary generalized (osteo)arthritis: Secondary | ICD-10-CM

## 2018-12-01 DIAGNOSIS — S12191D Other nondisplaced fracture of second cervical vertebra, subsequent encounter for fracture with routine healing: Secondary | ICD-10-CM

## 2018-12-01 LAB — POCT URINE DRUG SCREEN
POC Amphetamine UR: NOT DETECTED
POC BENZODIAZEPINES UR: NOT DETECTED
POC Barbiturate UR: NOT DETECTED
POC Cocaine UR: NOT DETECTED
POC Ecstasy UR: NOT DETECTED
POC Marijuana UR: NOT DETECTED
POC Methadone UR: NOT DETECTED
POC Methamphetamine UR: NOT DETECTED
POC Opiate Ur: POSITIVE — AB
POC Oxycodone UR: POSITIVE — AB
POC PHENCYCLIDINE UR: NOT DETECTED
POC TRICYCLICS UR: NOT DETECTED

## 2018-12-01 MED ORDER — HYDROCODONE-ACETAMINOPHEN 7.5-325 MG PO TABS: 1.0000 | ORAL_TABLET | ORAL | 0 refills | Status: DC | PRN
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
## 2018-12-03 DIAGNOSIS — S12101D Unspecified nondisplaced fracture of second cervical vertebra, subsequent encounter for fracture with routine healing: Secondary | ICD-10-CM | POA: Insufficient documentation

## 2018-12-03 DIAGNOSIS — Z79899 Other long term (current) drug therapy: Secondary | ICD-10-CM | POA: Insufficient documentation

## 2018-12-03 DIAGNOSIS — S0181XA Laceration without foreign body of other part of head, initial encounter: Secondary | ICD-10-CM | POA: Insufficient documentation

## 2018-12-08 ENCOUNTER — Other Ambulatory Visit: Payer: Self-pay

## 2018-12-08 ENCOUNTER — Ambulatory Visit (INDEPENDENT_AMBULATORY_CARE_PROVIDER_SITE_OTHER): Payer: Medicare HMO | Admitting: Nurse Practitioner

## 2018-12-08 ENCOUNTER — Encounter: Payer: Self-pay | Admitting: Nurse Practitioner

## 2018-12-08 VITALS — BP 153/81 | HR 96 | Resp 16 | Ht 70.0 in | Wt 211.0 lb

## 2018-12-08 DIAGNOSIS — I10 Essential (primary) hypertension: Secondary | ICD-10-CM

## 2018-12-08 DIAGNOSIS — S12191D Other nondisplaced fracture of second cervical vertebra, subsequent encounter for fracture with routine healing: Secondary | ICD-10-CM

## 2018-12-08 DIAGNOSIS — M150 Primary generalized (osteo)arthritis: Secondary | ICD-10-CM

## 2018-12-08 DIAGNOSIS — M542 Cervicalgia: Secondary | ICD-10-CM | POA: Diagnosis not present

## 2018-12-08 MED ORDER — HYDROCODONE-ACETAMINOPHEN 7.5-325 MG PO TABS: 1.0000 | ORAL_TABLET | ORAL | 0 refills | Status: DC | PRN

## 2018-12-08 NOTE — Progress Notes (Signed)
[facility] [place], [place] [address]  Internal MEDICINE  Office Visit Note  Patient Name: Russell Sullivan  [id number]  [id number]  Date of Service: 12/08/2018  Chief Complaint  Patient presents with  . Medical Management of Chronic Issues    1 week follow up   . Neck Pain    The patient is here for follow up visit. He had trauma to the head and neck last week. X-rays done I ER show an osteophyte fracture of C2 and moderate degenerative disc disease. Continues to have severe pain in back of his head and neck. Cannot lay his head back without severe pain. Cannot turn his head to the left or right. He is now wearing a soft collar around the neck. I did give him a short term prescription for hydrocodone/APAP 7.5/325mg  tablets. He states that the pain in his neck and head is about 7 ot 8 out of 10 in severity. Will take the pain medication and about one to two hours later, will take tramadol to help with severe pain. This does help to relieve pain. He states that numbness he was experiencing in his neck and face has improved. Left side, the numbness has completely resolved. Is still present on the right side. He was given a referral to neurosurgeon to further discuss the osteophyte fracture at [facility]. This was a telephone visit and he opted out of the visit. He needs to hae a new referral for further evaluation and treatment.  The patient has had stitches removed from left forehead. Laceration appears to be healing well.       Current Medication: Outpatient Encounter Medications as of 12/08/2018  Medication Sig  . albuterol (PROVENTIL HFA;VENTOLIN HFA) 108 (90 Base) MCG/ACT inhaler Inhale 2 puffs into the lungs every 6 (six) hours as needed for wheezing or shortness of breath.  Marland Kitchen aspirin EC 81 MG tablet Take 1 tablet by mouth daily.  . famotidine (PEPCID) 20 MG tablet Take 1 tablet (20 mg total) by mouth 2 (two) times daily for 7 days.  . flunisolide (NASALIDE) 25  MCG/ACT (0.025%) SOLN Place 2 sprays into the nose 2 (two) times daily.  Marland Kitchen HYDROcodone-acetaminophen (NORCO) 7.5-325 MG tablet Take 1 tablet by mouth every 4 (four) hours as needed for moderate pain.  . meloxicam (MOBIC) 15 MG tablet Take 1 tablet (15 mg total) by mouth daily.  . mometasone (NASONEX) 50 MCG/ACT nasal spray Place 2 sprays into the nose daily.  . pantoprazole (PROTONIX) 40 MG tablet Take 1 tablet (40 mg total) by mouth daily.  . rosuvastatin (CRESTOR) 20 MG tablet Take 20 mg by mouth at bedtime.  . sildenafil (VIAGRA) 100 MG tablet Take by mouth.  . traMADol (ULTRAM) 50 MG tablet Take 1 tablet (50 mg total) by mouth 2 (two) times daily as needed.  . [DISCONTINUED] HYDROcodone-acetaminophen (NORCO) 7.5-325 MG tablet Take 1 tablet by mouth every 4 (four) hours as needed for moderate pain.   No facility-administered encounter medications on file as of 12/08/2018.     Surgical History: Past Surgical History:  Procedure Laterality Date  . HERNIA REPAIR  2009  . INTUBATION-ENDOTRACHEAL WITH TRACHEOSTOMY STANDBY N/A 01/13/2018   Procedure: INTUBATION-ENDOTRACHEAL WITH TRACHEOSTOMY STANDBY;  Surgeon: Beverly Gust, MD;  Location: [facility] ORS;  Service: ENT;  Laterality: N/A;  . left shoulder surgery      Medical History: Past Medical History:  Diagnosis Date  . Hyperlipidemia   . Hypertension     Family History:  Family History  Problem Relation Age of Onset  . Hypertension Mother   . Diabetes Mother   . Hypertension Father   . Diabetes Maternal Grandmother   . Diabetes Paternal Grandmother     Social History   Socioeconomic History  . Marital status: Married    Spouse name: Not on file  . Number of children: Not on file  . Years of education: Not on file  . Highest education level: Not on file  Occupational History  . Not on file  Social Needs  . Financial resource strain: Not on file  . Food insecurity    Worry: Not on file    Inability: Not on file  .  Transportation needs    Medical: Not on file    Non-medical: Not on file  Tobacco Use  . Smoking status: Never Smoker  . Smokeless tobacco: Never Used  Substance and Sexual Activity  . Alcohol use: No    Frequency: Never  . Drug use: No  . Sexual activity: Not on file  Lifestyle  . Physical activity    Days per week: Not on file    Minutes per session: Not on file  . Stress: Not on file  Relationships  . Social Herbalist on phone: Not on file    Gets together: Not on file    Attends religious service: Not on file    Active member of club or organization: Not on file    Attends meetings of clubs or organizations: Not on file    Relationship status: Not on file  . Intimate partner violence    Fear of current or ex partner: Not on file    Emotionally abused: Not on file    Physically abused: Not on file    Forced sexual activity: Not on file  Other Topics Concern  . Not on file  Social History Narrative  . Not on file      Review of Systems  Constitutional: Positive for activity change and fatigue. Negative for chills and unexpected weight change.  HENT: Negative for congestion, postnasal drip, rhinorrhea, sneezing and sore throat.   Respiratory: Negative for cough, chest tightness and shortness of breath.   Cardiovascular: Negative for chest pain and palpitations.       Blood pressure a bit elevated today.   Gastrointestinal: Negative for abdominal pain, constipation, diarrhea, nausea and vomiting.  Musculoskeletal: Positive for back pain, myalgias, neck pain and neck stiffness. Negative for arthralgias and joint swelling.  Skin: Negative for rash.       Bandaged laceration on the left side of the forehead.  Neurological: Positive for dizziness and headaches. Negative for tremors and numbness.  Hematological: Negative for adenopathy. Does not bruise/bleed easily.  Psychiatric/Behavioral: Negative for behavioral problems (Depression), sleep disturbance and  suicidal ideas. The patient is nervous/anxious.     Today's Vitals   12/08/18 0842  BP: (!) 153/81  Pulse: 96  Resp: 16  SpO2: 99%  Weight: 211 lb (95.7 kg)  Height: 5\' 10"  (1.778 m)   Body mass index is 30.28 kg/m.  Physical Exam Vitals signs and nursing note reviewed.  Constitutional:      Appearance: Normal appearance. He is well-developed. He is not diaphoretic.  HENT:     Head: Normocephalic and atraumatic.     Mouth/Throat:     Pharynx: No oropharyngeal exudate.  Eyes:     Pupils: Pupils are equal, round, and reactive to light.  Neck:  Musculoskeletal: Normal range of motion and neck supple.     Thyroid: No thyromegaly.     Vascular: No JVD.     Trachea: No tracheal deviation.  Cardiovascular:     Rate and Rhythm: Normal rate and regular rhythm.     Heart sounds: Normal heart sounds. No murmur. No friction rub. No gallop.   Pulmonary:     Effort: Pulmonary effort is normal. No respiratory distress.     Breath sounds: Normal breath sounds. No wheezing or rales.  Chest:     Chest wall: No tenderness.  Abdominal:     General: Bowel sounds are normal.     Palpations: Abdomen is soft.     Tenderness: There is no abdominal tenderness.  Musculoskeletal: Normal range of motion.     Comments: The patient is having moderate to severe neck pain and stiffness. Worse when turning or bending his head to the left and right. Grimacing is noted with each movement. He is also having moderate back pain. This made worse with bending and twisting at the waist.   Lymphadenopathy:     Cervical: No cervical adenopathy.  Skin:    General: Skin is warm and dry.     Comments: Staples from laceration on left forehead are removed. Skin is healing well.   Neurological:     Mental Status: He is alert and oriented to person, place, and time.     Cranial Nerves: No cranial nerve deficit.  Psychiatric:        Behavior: Behavior normal.        Thought Content: Thought content normal.         Judgment: Judgment normal.    Assessment/Plan: 1. Neck pain, acute Osteophyte fracture to C2. Now in soft neck collar. Will need to have referral to neurosurgery for further evaluation and treatment. - Ambulatory referral to Neurosurgery  2. Other closed nondisplaced fracture of second cervical vertebra with routine healing, subsequent encounter Osteophyte fracture to C2. Now in soft neck collar. Will need to have referral to neurosurgery for further evaluation and treatment. - Ambulatory referral to Neurosurgery  3. Primary generalized (osteo)arthritis Short term prescription for hydrocodone/APAP 7.5/325mg  may be taken up to three times daily as needed for pain. Reviewed risks and possible side effects associated with taking opiates, benzodiazepines and other CNS depressants. Combination of these could cause dizziness and drowsiness. Advised patient not to drive or operate machinery when taking these medications, as patient's and other's life can be at risk and will have consequences. Patient verbalized understanding in this matter. Dependence and abuse for these drugs will be monitored closely. A Controlled substance policy and procedure is on file which allows [facility] to order a urine drug screen test at any visit. Patient understands and agrees with the plan. Follow up in 1 week.  - HYDROcodone-acetaminophen (NORCO) 7.5-325 MG tablet; Take 1 tablet by mouth every 4 (four) hours as needed for moderate pain.  Dispense: 24 tablet; Refill: 0  4. Essential (primary) hypertension BP elevated, likely due to pain. Continue BP medication as prescribed   General Counseling: Russell Sullivan verbalizes understanding of the findings of todays visit and agrees with plan of treatment. I have discussed any further diagnostic evaluation that may be needed or ordered today. We also reviewed his medications today. he has been encouraged to call the office with any questions or concerns that should  arise related to todays visit.   This patient was seen by [place] with  Dr Lavera Guise as a part of collaborative care agreement  Orders Placed This Encounter  Procedures  . Ambulatory referral to Neurosurgery    Meds ordered this encounter  Medications  . HYDROcodone-acetaminophen (NORCO) 7.5-325 MG tablet    Sig: Take 1 tablet by mouth every 4 (four) hours as needed for moderate pain.    Dispense:  24 tablet    Refill:  0    Increased dose for short term prescription only. Will revaluate next week.    Order Specific Question:   Supervising Provider    Answer:   Lavera Guise [id number]    Time spent: 102 Minutes      Dr Lavera Guise Internal medicine

## 2018-12-12 ENCOUNTER — Ambulatory Visit: Payer: Self-pay | Admitting: Nurse Practitioner

## 2018-12-12 DIAGNOSIS — W2209XA Striking against other stationary object, initial encounter: Secondary | ICD-10-CM | POA: Diagnosis not present

## 2018-12-12 DIAGNOSIS — M50321 Other cervical disc degeneration at C4-C5 level: Secondary | ICD-10-CM | POA: Diagnosis not present

## 2018-12-12 DIAGNOSIS — M4312 Spondylolisthesis, cervical region: Secondary | ICD-10-CM | POA: Diagnosis not present

## 2018-12-12 DIAGNOSIS — M47812 Spondylosis without myelopathy or radiculopathy, cervical region: Secondary | ICD-10-CM | POA: Diagnosis not present

## 2018-12-12 DIAGNOSIS — M542 Cervicalgia: Secondary | ICD-10-CM | POA: Diagnosis not present

## 2018-12-12 DIAGNOSIS — S12190A Other displaced fracture of second cervical vertebra, initial encounter for closed fracture: Secondary | ICD-10-CM | POA: Diagnosis not present

## 2018-12-12 DIAGNOSIS — M4040 Postural lordosis, site unspecified: Secondary | ICD-10-CM | POA: Diagnosis not present

## 2018-12-12 DIAGNOSIS — S12100A Unspecified displaced fracture of second cervical vertebra, initial encounter for closed fracture: Secondary | ICD-10-CM | POA: Diagnosis not present

## 2018-12-12 DIAGNOSIS — R202 Paresthesia of skin: Secondary | ICD-10-CM | POA: Diagnosis not present

## 2018-12-13 ENCOUNTER — Ambulatory Visit: Payer: Self-pay | Admitting: Nurse Practitioner

## 2018-12-13 DIAGNOSIS — S12100A Unspecified displaced fracture of second cervical vertebra, initial encounter for closed fracture: Secondary | ICD-10-CM | POA: Diagnosis not present

## 2018-12-13 DIAGNOSIS — M50221 Other cervical disc displacement at C4-C5 level: Secondary | ICD-10-CM | POA: Diagnosis not present

## 2018-12-13 DIAGNOSIS — M50321 Other cervical disc degeneration at C4-C5 level: Secondary | ICD-10-CM | POA: Diagnosis not present

## 2018-12-13 DIAGNOSIS — M4802 Spinal stenosis, cervical region: Secondary | ICD-10-CM | POA: Diagnosis not present

## 2018-12-19 ENCOUNTER — Encounter: Payer: Self-pay | Admitting: Nurse Practitioner

## 2018-12-19 ENCOUNTER — Other Ambulatory Visit: Payer: Self-pay

## 2018-12-19 ENCOUNTER — Ambulatory Visit (INDEPENDENT_AMBULATORY_CARE_PROVIDER_SITE_OTHER): Payer: Medicare HMO | Admitting: Nurse Practitioner

## 2018-12-19 VITALS — BP 155/83 | HR 90 | Resp 16 | Ht 70.0 in | Wt 217.0 lb

## 2018-12-19 DIAGNOSIS — S12191D Other nondisplaced fracture of second cervical vertebra, subsequent encounter for fracture with routine healing: Secondary | ICD-10-CM

## 2018-12-19 DIAGNOSIS — L570 Actinic keratosis: Secondary | ICD-10-CM | POA: Diagnosis not present

## 2018-12-19 DIAGNOSIS — M150 Primary generalized (osteo)arthritis: Secondary | ICD-10-CM | POA: Diagnosis not present

## 2018-12-19 DIAGNOSIS — L821 Other seborrheic keratosis: Secondary | ICD-10-CM | POA: Diagnosis not present

## 2018-12-19 DIAGNOSIS — L299 Pruritus, unspecified: Secondary | ICD-10-CM | POA: Diagnosis not present

## 2018-12-19 DIAGNOSIS — L814 Other melanin hyperpigmentation: Secondary | ICD-10-CM | POA: Diagnosis not present

## 2018-12-19 DIAGNOSIS — M542 Cervicalgia: Secondary | ICD-10-CM

## 2018-12-19 MED ORDER — HYDROCODONE-ACETAMINOPHEN 7.5-325 MG PO TABS: 1.0000 | ORAL_TABLET | ORAL | 0 refills | Status: DC | PRN

## 2018-12-19 MED ORDER — TRAMADOL HCL 50 MG PO TABS: 50.0000 mg | ORAL_TABLET | Freq: Two times a day (BID) | ORAL | 3 refills | Status: DC | PRN

## 2018-12-19 NOTE — Progress Notes (Signed)
[facility] [place], [place] [address]  Internal MEDICINE  Office Visit Note  Patient Name: Russell Sullivan  [id number]  [id number]  Date of Service: 12/19/2018  Chief Complaint  Patient presents with  . Neck Pain    The patient is here for follow up visit. He had trauma to the head and neck two weeks ago.. X-rays done I ER show an osteophyte fracture of C2 and moderate degenerative disc disease. Continues to have severe pain in back of his head and neck. He has followed up with orthopedics at [facility]. He does have significant, broad based disc bulging at multiple levels as well as unfused osteophyte. They do not believe this is acute fracture, but very irritated from the recent accident/trauma.  He is now in hard c-collar to provide support. I did give him a short term prescription for hydrocodone/APAP 7.5/325mg  tablets. He states that the pain in his neck and head is about 7 ot 8 out of 10 in severity. Will take the pain medication and about one to two hours later, will take tramadol to help with severe pain. This does help to relieve pain. He states that numbness he was experiencing in his neck and face has improved. Left side, the numbness has completely resolved. Is still present on the right side.       Current Medication: Outpatient Encounter Medications as of 12/19/2018  Medication Sig  . albuterol (PROVENTIL HFA;VENTOLIN HFA) 108 (90 Base) MCG/ACT inhaler Inhale 2 puffs into the lungs every 6 (six) hours as needed for wheezing or shortness of breath.  Marland Kitchen aspirin EC 81 MG tablet Take 1 tablet by mouth daily.  . famotidine (PEPCID) 20 MG tablet Take 1 tablet (20 mg total) by mouth 2 (two) times daily for 7 days.  . flunisolide (NASALIDE) 25 MCG/ACT (0.025%) SOLN Place 2 sprays into the nose 2 (two) times daily.  Marland Kitchen HYDROcodone-acetaminophen (NORCO) 7.5-325 MG tablet Take 1 tablet by mouth every 4 (four) hours as needed for moderate pain.  . meloxicam (MOBIC) 15 MG  tablet Take 1 tablet (15 mg total) by mouth daily.  . mometasone (NASONEX) 50 MCG/ACT nasal spray Place 2 sprays into the nose daily.  . pantoprazole (PROTONIX) 40 MG tablet Take 1 tablet (40 mg total) by mouth daily.  . rosuvastatin (CRESTOR) 20 MG tablet Take 20 mg by mouth at bedtime.  . sildenafil (VIAGRA) 100 MG tablet Take by mouth.  . traMADol (ULTRAM) 50 MG tablet Take 1 tablet (50 mg total) by mouth 2 (two) times daily as needed.  . [DISCONTINUED] HYDROcodone-acetaminophen (NORCO) 7.5-325 MG tablet Take 1 tablet by mouth every 4 (four) hours as needed for moderate pain.  . [DISCONTINUED] traMADol (ULTRAM) 50 MG tablet Take 1 tablet (50 mg total) by mouth 2 (two) times daily as needed.   No facility-administered encounter medications on file as of 12/19/2018.     Surgical History: Past Surgical History:  Procedure Laterality Date  . HERNIA REPAIR  2009  . INTUBATION-ENDOTRACHEAL WITH TRACHEOSTOMY STANDBY N/A 01/13/2018   Procedure: INTUBATION-ENDOTRACHEAL WITH TRACHEOSTOMY STANDBY;  Surgeon: Beverly Gust, MD;  Location: [facility] ORS;  Service: ENT;  Laterality: N/A;  . left shoulder surgery      Medical History: Past Medical History:  Diagnosis Date  . Hyperlipidemia   . Hypertension     Family History: Family History  Problem Relation Age of Onset  . Hypertension Mother   . Diabetes Mother   . Hypertension Father   .  Diabetes Maternal Grandmother   . Diabetes Paternal Grandmother     Social History   Socioeconomic History  . Marital status: Married    Spouse name: Not on file  . Number of children: Not on file  . Years of education: Not on file  . Highest education level: Not on file  Occupational History  . Not on file  Social Needs  . Financial resource strain: Not on file  . Food insecurity    Worry: Not on file    Inability: Not on file  . Transportation needs    Medical: Not on file    Non-medical: Not on file  Tobacco Use  . Smoking status: Never  Smoker  . Smokeless tobacco: Never Used  Substance and Sexual Activity  . Alcohol use: No    Frequency: Never  . Drug use: No  . Sexual activity: Not on file  Lifestyle  . Physical activity    Days per week: Not on file    Minutes per session: Not on file  . Stress: Not on file  Relationships  . Social Herbalist on phone: Not on file    Gets together: Not on file    Attends religious service: Not on file    Active member of club or organization: Not on file    Attends meetings of clubs or organizations: Not on file    Relationship status: Not on file  . Intimate partner violence    Fear of current or ex partner: Not on file    Emotionally abused: Not on file    Physically abused: Not on file    Forced sexual activity: Not on file  Other Topics Concern  . Not on file  Social History Narrative  . Not on file      Review of Systems  Constitutional: Positive for activity change. Negative for chills, fatigue and unexpected weight change.  HENT: Negative for congestion, postnasal drip, rhinorrhea, sneezing and sore throat.   Respiratory: Negative for cough, chest tightness and shortness of breath.   Cardiovascular: Negative for chest pain and palpitations.       Blood pressure a bit elevated today.   Gastrointestinal: Negative for abdominal pain, constipation, diarrhea, nausea and vomiting.  Musculoskeletal: Positive for back pain, myalgias, neck pain and neck stiffness. Negative for arthralgias and joint swelling.  Skin: Negative for rash.       Bandaged laceration on the left side of the forehead.  Neurological: Positive for headaches. Negative for dizziness, tremors and numbness.  Hematological: Negative for adenopathy. Does not bruise/bleed easily.  Psychiatric/Behavioral: Negative for behavioral problems (Depression), sleep disturbance and suicidal ideas. The patient is nervous/anxious.     Today's Vitals   12/19/18 1135  BP: (!) 155/83  Pulse: 90  Resp:  16  SpO2: 98%  Weight: 217 lb (98.4 kg)  Height: 5\' 10"  (1.778 m)   Body mass index is 31.14 kg/m.  Physical Exam Vitals signs and nursing note reviewed.  Constitutional:      Appearance: Normal appearance. He is well-developed. He is not diaphoretic.  HENT:     Head: Normocephalic and atraumatic.     Mouth/Throat:     Pharynx: No oropharyngeal exudate.  Eyes:     Pupils: Pupils are equal, round, and reactive to light.  Neck:     Musculoskeletal: Normal range of motion and neck supple.     Thyroid: No thyromegaly.     Vascular: No JVD.  Trachea: No tracheal deviation.     Comments: Patient is currently in hard collar to support the neck. He Is unable to turn or bend his head to the left or right. Moderate to severe pain still reported  Cardiovascular:     Rate and Rhythm: Normal rate and regular rhythm.     Heart sounds: Normal heart sounds. No murmur. No friction rub. No gallop.   Pulmonary:     Effort: Pulmonary effort is normal. No respiratory distress.     Breath sounds: Normal breath sounds. No wheezing or rales.  Chest:     Chest wall: No tenderness.  Abdominal:     General: Bowel sounds are normal.     Palpations: Abdomen is soft.     Tenderness: There is no abdominal tenderness.  Musculoskeletal: Normal range of motion.     Comments: He is having moderate back pain. This made worse with bending and twisting at the waist.   Lymphadenopathy:     Cervical: No cervical adenopathy.  Skin:    General: Skin is warm and dry.  Neurological:     Mental Status: He is alert and oriented to person, place, and time.     Cranial Nerves: No cranial nerve deficit.  Psychiatric:        Behavior: Behavior normal.        Thought Content: Thought content normal.        Judgment: Judgment normal.   Assessment/Plan: 1. Neck pain, acute Renew current prescription for hydrocodone/APAP 7.5/325mg . Advised the patient that he should reduce frequency of taking this medication to no  more than three times daily. May use tramadol 50mg  twice daily, in between doses of hydrocodone to help control pain. He voiced understanding of these instructions and will follow up in 10 days.   2. Other closed nondisplaced fracture of second cervical vertebra with routine healing, subsequent encounter He should continue to follow up with orthopedic provider at [facility]  3. Primary generalized (osteo)arthritis Renew current prescription for hydrocodone/APAP 7.5/325mg . Advised the patient that he should reduce frequency of taking this medication to no more than three times daily. May use tramadol 50mg  twice daily, in between doses of hydrocodone to help control pain. He voiced understanding of these instructions and will follow up in 10 days.  - traMADol (ULTRAM) 50 MG tablet; Take 1 tablet (50 mg total) by mouth 2 (two) times daily as needed.  Dispense: 60 tablet; Refill: 3 - HYDROcodone-acetaminophen (NORCO) 7.5-325 MG tablet; Take 1 tablet by mouth every 4 (four) hours as needed for moderate pain.  Dispense: 30 tablet; Refill: 0  General Counseling: [place] verbalizes understanding of the findings of todays visit and agrees with plan of treatment. I have discussed any further diagnostic evaluation that may be needed or ordered today. We also reviewed his medications today. he has been encouraged to call the office with any questions or concerns that should arise related to todays visit.  Reviewed risks and possible side effects associated with taking opiates, benzodiazepines and other CNS depressants. Combination of these could cause dizziness and drowsiness. Advised patient not to drive or operate machinery when taking these medications, as patient's and other's life can be at risk and will have consequences. Patient verbalized understanding in this matter. Dependence and abuse for these drugs will be monitored closely. A Controlled substance policy and procedure is on file which allows [facility] to order a urine drug screen test at any visit. Patient understands and agrees with the  plan  This patient was seen by [place] with Dr Lavera Guise as a part of collaborative care agreement  Meds ordered this encounter  Medications  . traMADol (ULTRAM) 50 MG tablet    Sig: Take 1 tablet (50 mg total) by mouth 2 (two) times daily as needed.    Dispense:  60 tablet    Refill:  3    To take for breakthrough pain from hydrocodone. Starting to cut down dosing of hydrocodone    Order Specific Question:   Supervising Provider    Answer:   Lavera Guise [place]  . HYDROcodone-acetaminophen (NORCO) 7.5-325 MG tablet    Sig: Take 1 tablet by mouth every 4 (four) hours as needed for moderate pain.    Dispense:  30 tablet    Refill:  0    Increased dose for short term prescription only. Will revaluate next week.    Order Specific Question:   Supervising Provider    Answer:   Lavera Guise [id number]    Time spent: 60 Minutes      Dr Lavera Guise Internal medicine

## 2018-12-28 ENCOUNTER — Ambulatory Visit (INDEPENDENT_AMBULATORY_CARE_PROVIDER_SITE_OTHER): Payer: Medicare HMO | Admitting: Nurse Practitioner

## 2018-12-28 ENCOUNTER — Other Ambulatory Visit: Payer: Self-pay

## 2018-12-28 ENCOUNTER — Encounter: Payer: Self-pay | Admitting: Nurse Practitioner

## 2018-12-28 VITALS — BP 147/86 | HR 83 | Temp 98.1°F | Resp 16 | Ht 70.0 in | Wt 211.0 lb

## 2018-12-28 DIAGNOSIS — J309 Allergic rhinitis, unspecified: Secondary | ICD-10-CM | POA: Diagnosis not present

## 2018-12-28 DIAGNOSIS — M542 Cervicalgia: Secondary | ICD-10-CM | POA: Diagnosis not present

## 2018-12-28 DIAGNOSIS — M150 Primary generalized (osteo)arthritis: Secondary | ICD-10-CM

## 2018-12-28 DIAGNOSIS — I10 Essential (primary) hypertension: Secondary | ICD-10-CM | POA: Diagnosis not present

## 2018-12-28 MED ORDER — FLUNISOLIDE 25 MCG/ACT (0.025%) NA SOLN: 2.0000 | Freq: Two times a day (BID) | NASAL | 3 refills | Status: DC

## 2018-12-28 MED ORDER — HYDROCODONE-ACETAMINOPHEN 7.5-325 MG PO TABS: 1.0000 | ORAL_TABLET | Freq: Two times a day (BID) | ORAL | 0 refills | Status: DC | PRN

## 2018-12-28 MED ORDER — LEVOCETIRIZINE DIHYDROCHLORIDE 5 MG PO TABS: 5.0000 mg | ORAL_TABLET | Freq: Every evening | ORAL | 3 refills | Status: DC

## 2018-12-28 NOTE — Progress Notes (Signed)
[facility] [place], [place] [address]  Internal MEDICINE  Office Visit Note  Patient Name: Russell Sullivan  [id number]  [id number]  Date of Service: 12/28/2018  Chief Complaint  Patient presents with  . Follow-up    weaning off neck pain medications  . Medication Management    pt has been taking zyrtec and wants to know if thats ok or if you can give him an rx for allergies    The patient is here for follow up visit. He had trauma to the head and neck several weeks ago.. X-rays done I ER show an osteophyte fracture of C2 and moderate degenerative disc disease. Continues to have severe pain in back of his head and neck. He has followed up with orthopedics at [facility]. He does have significant, broad based disc bulging at multiple levels as well as unfused osteophyte. They do not believe this is acute fracture, but very irritated from the recent accident/trauma.  He is to have a follow up MRI in about 6 weeks. He is now in hard c-collar to provide support. I did give him a short term prescription for hydrocodone/APAP 7.5/325mg  tablets.he has weaned down dosing to twice daily when needed and is trying to wean down the dose completely. He states that he is able to take tramadol for less severe pain and this is working pretty well.  The patient is c/o worsening allergies. He currently taking zyrtec and using flonase nasal spray. There are times, especially at night. Will wake up and not be able to breathe out of his nose at all.       Current Medication: Outpatient Encounter Medications as of 12/28/2018  Medication Sig  . albuterol (PROVENTIL HFA;VENTOLIN HFA) 108 (90 Base) MCG/ACT inhaler Inhale 2 puffs into the lungs every 6 (six) hours as needed for wheezing or shortness of breath.  Marland Kitchen aspirin EC 81 MG tablet Take 1 tablet by mouth daily.  . famotidine (PEPCID) 20 MG tablet Take 1 tablet (20 mg total) by mouth 2 (two) times daily for 7 days.  . flunisolide (NASALIDE)  25 MCG/ACT (0.025%) SOLN Place 2 sprays into the nose 2 (two) times daily.  Marland Kitchen HYDROcodone-acetaminophen (NORCO) 7.5-325 MG tablet Take 1 tablet by mouth 2 (two) times daily as needed for moderate pain.  Marland Kitchen levocetirizine (XYZAL) 5 MG tablet Take 1 tablet (5 mg total) by mouth every evening.  . meloxicam (MOBIC) 15 MG tablet Take 1 tablet (15 mg total) by mouth daily.  . mometasone (NASONEX) 50 MCG/ACT nasal spray Place 2 sprays into the nose daily.  . pantoprazole (PROTONIX) 40 MG tablet Take 1 tablet (40 mg total) by mouth daily.  . rosuvastatin (CRESTOR) 20 MG tablet Take 20 mg by mouth at bedtime.  . sildenafil (VIAGRA) 100 MG tablet Take by mouth.  . traMADol (ULTRAM) 50 MG tablet Take 1 tablet (50 mg total) by mouth 2 (two) times daily as needed.  . [DISCONTINUED] flunisolide (NASALIDE) 25 MCG/ACT (0.025%) SOLN Place 2 sprays into the nose 2 (two) times daily.  . [DISCONTINUED] HYDROcodone-acetaminophen (NORCO) 7.5-325 MG tablet Take 1 tablet by mouth every 4 (four) hours as needed for moderate pain.   No facility-administered encounter medications on file as of 12/28/2018.     Surgical History: Past Surgical History:  Procedure Laterality Date  . HERNIA REPAIR  2009  . INTUBATION-ENDOTRACHEAL WITH TRACHEOSTOMY STANDBY N/A 01/13/2018   Procedure: INTUBATION-ENDOTRACHEAL WITH TRACHEOSTOMY STANDBY;  Surgeon: Beverly Gust, MD;  Location: [facility] ORS;  Service: ENT;  Laterality: N/A;  . left shoulder surgery      Medical History: Past Medical History:  Diagnosis Date  . Hyperlipidemia   . Hypertension     Family History: Family History  Problem Relation Age of Onset  . Hypertension Mother   . Diabetes Mother   . Hypertension Father   . Diabetes Maternal Grandmother   . Diabetes Paternal Grandmother     Social History   Socioeconomic History  . Marital status: Married    Spouse name: Not on file  . Number of children: Not on file  . Years of education: Not on file  .  Highest education level: Not on file  Occupational History  . Not on file  Social Needs  . Financial resource strain: Not on file  . Food insecurity    Worry: Not on file    Inability: Not on file  . Transportation needs    Medical: Not on file    Non-medical: Not on file  Tobacco Use  . Smoking status: Never Smoker  . Smokeless tobacco: Never Used  Substance and Sexual Activity  . Alcohol use: No    Frequency: Never  . Drug use: No  . Sexual activity: Not on file  Lifestyle  . Physical activity    Days per week: Not on file    Minutes per session: Not on file  . Stress: Not on file  Relationships  . Social Herbalist on phone: Not on file    Gets together: Not on file    Attends religious service: Not on file    Active member of club or organization: Not on file    Attends meetings of clubs or organizations: Not on file    Relationship status: Not on file  . Intimate partner violence    Fear of current or ex partner: Not on file    Emotionally abused: Not on file    Physically abused: Not on file    Forced sexual activity: Not on file  Other Topics Concern  . Not on file  Social History Narrative  . Not on file      Review of Systems  Constitutional: Positive for activity change. Negative for chills, fatigue and unexpected weight change.  HENT: Positive for congestion, postnasal drip and rhinorrhea. Negative for sneezing and sore throat.   Respiratory: Negative for cough, chest tightness and shortness of breath.   Cardiovascular: Negative for chest pain and palpitations.       Blood pressure improved since last few visits.   Gastrointestinal: Negative for abdominal pain, constipation, diarrhea, nausea and vomiting.  Musculoskeletal: Positive for back pain, myalgias, neck pain and neck stiffness. Negative for arthralgias and joint swelling.       Improving  Skin: Negative for rash.  Neurological: Positive for headaches. Negative for dizziness, tremors  and numbness.  Hematological: Negative for adenopathy. Does not bruise/bleed easily.  Psychiatric/Behavioral: Negative for behavioral problems (Depression), sleep disturbance and suicidal ideas. The patient is nervous/anxious.     Today's Vitals   12/28/18 1008  BP: (!) 147/86  Pulse: 83  Resp: 16  Temp: 98.1 F (36.7 C)  SpO2: 98%  Weight: 211 lb (95.7 kg)  Height: 5\' 10"  (1.778 m)   Body mass index is 30.28 kg/m.  Physical Exam Vitals signs and nursing note reviewed.  Constitutional:      Appearance: Normal appearance. He is well-developed. He is not diaphoretic.  HENT:  Head: Normocephalic and atraumatic.     Mouth/Throat:     Pharynx: No oropharyngeal exudate.  Eyes:     Pupils: Pupils are equal, round, and reactive to light.  Neck:     Musculoskeletal: Normal range of motion and neck supple.     Thyroid: No thyromegaly.     Vascular: No JVD.     Trachea: No tracheal deviation.     Comments: Patient is currently in hard collar to support the neck. He Is unable to turn or bend his head to the left or right. Moderate to severe pain still reported  Cardiovascular:     Rate and Rhythm: Normal rate and regular rhythm.     Heart sounds: Normal heart sounds. No murmur. No friction rub. No gallop.   Pulmonary:     Effort: Pulmonary effort is normal. No respiratory distress.     Breath sounds: Normal breath sounds. No wheezing or rales.  Chest:     Chest wall: No tenderness.  Abdominal:     Palpations: Abdomen is soft.  Musculoskeletal: Normal range of motion.     Comments: He is having moderate back pain. This made worse with bending and twisting at the waist.   Lymphadenopathy:     Cervical: No cervical adenopathy.  Skin:    General: Skin is warm and dry.  Neurological:     Mental Status: He is alert and oriented to person, place, and time.     Cranial Nerves: No cranial nerve deficit.  Psychiatric:        Behavior: Behavior normal.        Thought Content:  Thought content normal.        Judgment: Judgment normal.    Assessment/Plan: 1. Neck pain, acute Weaning down hydrocodone 7.5/325mg  tablets until twice daily as needed. Recommend he wean dosing as indicated. May use tramadol twice daily as needed for less severe pain.   2. Essential (primary) hypertension Improving. conitnue bp medication as prescribed.   3. Allergic rhinitis, unspecified seasonality, unspecified trigger Change zyrtec to xyzal 5mg  daily. Use nasal spray as needed and as prescribed  - flunisolide (NASALIDE) 25 MCG/ACT (0.025%) SOLN; Place 2 sprays into the nose 2 (two) times daily.  Dispense: 25 mL; Refill: 3 - levocetirizine (XYZAL) 5 MG tablet; Take 1 tablet (5 mg total) by mouth every evening.  Dispense: 30 tablet; Refill: 3  4. Primary generalized (osteo)arthritis - HYDROcodone-acetaminophen (NORCO) 7.5-325 MG tablet; Take 1 tablet by mouth 2 (two) times daily as needed for moderate pain.  Dispense: 60 tablet; Refill: 0  General Counseling: Jibreel verbalizes understanding of the findings of todays visit and agrees with plan of treatment. I have discussed any further diagnostic evaluation that may be needed or ordered today. We also reviewed his medications today. he has been encouraged to call the office with any questions or concerns that should arise related to todays visit.  Reviewed risks and possible side effects associated with taking opiates, benzodiazepines and other CNS depressants. Combination of these could cause dizziness and drowsiness. Advised patient not to drive or operate machinery when taking these medications, as patient's and other's life can be at risk and will have consequences. Patient verbalized understanding in this matter. Dependence and abuse for these drugs will be monitored closely. A Controlled substance policy and procedure is on file which allows [facility] to order a urine drug screen test at any visit. Patient understands and  agrees with the plan  This patient was seen  by Leretha Pol FNP Collaboration with Dr Lavera Guise as a part of collaborative care agreement  Meds ordered this encounter  Medications  . HYDROcodone-acetaminophen (NORCO) 7.5-325 MG tablet    Sig: Take 1 tablet by mouth 2 (two) times daily as needed for moderate pain.    Dispense:  60 tablet    Refill:  0    Weaning down pain medication from acute neck injury.    Order Specific Question:   Supervising Provider    Answer:   Lavera Guise [id number]  . flunisolide (NASALIDE) 25 MCG/ACT (0.025%) SOLN    Sig: Place 2 sprays into the nose 2 (two) times daily.    Dispense:  25 mL    Refill:  3    Order Specific Question:   Supervising Provider    Answer:   Lavera Guise [id number]  . levocetirizine (XYZAL) 5 MG tablet    Sig: Take 1 tablet (5 mg total) by mouth every evening.    Dispense:  30 tablet    Refill:  3    The patient is not getting relief from taking zyrtec or claritin. Continues to use nasal spray.    Order Specific Question:   Supervising Provider    Answer:   Lavera Guise [id number]    Time spent: 37 Minutes      Dr Lavera Guise Internal medicine

## 2018-12-29 ENCOUNTER — Ambulatory Visit: Payer: Medicare HMO | Admitting: Nurse Practitioner

## 2019-01-22 ENCOUNTER — Other Ambulatory Visit: Payer: Self-pay

## 2019-01-22 ENCOUNTER — Ambulatory Visit (INDEPENDENT_AMBULATORY_CARE_PROVIDER_SITE_OTHER): Payer: Medicare HMO | Admitting: Nurse Practitioner

## 2019-01-22 ENCOUNTER — Encounter: Payer: Self-pay | Admitting: Nurse Practitioner

## 2019-01-22 VITALS — BP 148/88 | HR 79 | Temp 97.6°F | Resp 16 | Ht 70.0 in | Wt 214.0 lb

## 2019-01-22 DIAGNOSIS — I10 Essential (primary) hypertension: Secondary | ICD-10-CM | POA: Diagnosis not present

## 2019-01-22 DIAGNOSIS — Z1159 Encounter for screening for other viral diseases: Secondary | ICD-10-CM | POA: Diagnosis not present

## 2019-01-22 DIAGNOSIS — S12191D Other nondisplaced fracture of second cervical vertebra, subsequent encounter for fracture with routine healing: Secondary | ICD-10-CM

## 2019-01-22 DIAGNOSIS — J309 Allergic rhinitis, unspecified: Secondary | ICD-10-CM

## 2019-01-22 DIAGNOSIS — R300 Dysuria: Secondary | ICD-10-CM

## 2019-01-22 DIAGNOSIS — Z0001 Encounter for general adult medical examination with abnormal findings: Secondary | ICD-10-CM

## 2019-01-22 NOTE — Progress Notes (Signed)
[facility] [place], [place] [address]  Internal MEDICINE  Office Visit Note  Patient Name: Russell Sullivan  [id number]  NN:[id number]  Date of Service: 02/04/2019   Pt is here for routine health maintenance examination  Chief Complaint  Patient presents with  . Annual Exam  . Hypertension  . Hyperlipidemia     The patient is here for health maintenance exam.  He had trauma to the head and neck several weeks ago.. X-rays done I ER show an osteophyte fracture of C2 and moderate degenerative disc disease. Continues to have severe pain in back of his head and neck. This pain is starting to ease up. Is no longer wearing a soft or hard collar on his neck. He has followed up with orthopedics at [facility]. He does have significant, broad based disc bulging at multiple levels as well as unfused osteophyte. They do not believe this is acute fracture, but very irritated from the recent accident/trauma.  He is to have a follow up MRI in about 6 weeks.  I did give him a short term prescription for hydrocodone/APAP 7.5/325mg  tablets.he has weaned down dosing to twice daily when needed and is trying to wean down the dose completely. He has been unable to take dosing below twice daily. He states that he is able to take tramadol for less severe pain and this is working pretty well.     Current Medication: Outpatient Encounter Medications as of 01/22/2019  Medication Sig  . albuterol (PROVENTIL HFA;VENTOLIN HFA) 108 (90 Base) MCG/ACT inhaler Inhale 2 puffs into the lungs every 6 (six) hours as needed for wheezing or shortness of breath.  Marland Kitchen aspirin EC 81 MG tablet Take 1 tablet by mouth daily.  . flunisolide (NASALIDE) 25 MCG/ACT (0.025%) SOLN Place 2 sprays into the nose 2 (two) times daily.  Marland Kitchen levocetirizine (XYZAL) 5 MG tablet Take 1 tablet (5 mg total) by mouth every evening.  . meloxicam (MOBIC) 15 MG tablet Take 1 tablet (15 mg total) by mouth daily.  . mometasone (NASONEX) 50  MCG/ACT nasal spray Place 2 sprays into the nose daily.  . pantoprazole (PROTONIX) 40 MG tablet Take 1 tablet (40 mg total) by mouth daily.  . sildenafil (VIAGRA) 100 MG tablet Take by mouth.  . traMADol (ULTRAM) 50 MG tablet Take 1 tablet (50 mg total) by mouth 2 (two) times daily as needed.  . [DISCONTINUED] HYDROcodone-acetaminophen (NORCO) 7.5-325 MG tablet Take 1 tablet by mouth 2 (two) times daily as needed for moderate pain.  . [DISCONTINUED] rosuvastatin (CRESTOR) 20 MG tablet Take 20 mg by mouth at bedtime.  . famotidine (PEPCID) 20 MG tablet Take 1 tablet (20 mg total) by mouth 2 (two) times daily for 7 days.   No facility-administered encounter medications on file as of 01/22/2019.     Surgical History: Past Surgical History:  Procedure Laterality Date  . HERNIA REPAIR  2009  . INTUBATION-ENDOTRACHEAL WITH TRACHEOSTOMY STANDBY N/A 01/13/2018   Procedure: INTUBATION-ENDOTRACHEAL WITH TRACHEOSTOMY STANDBY;  Surgeon: Beverly Gust, MD;  Location: [facility] ORS;  Service: ENT;  Laterality: N/A;  . left shoulder surgery      Medical History: Past Medical History:  Diagnosis Date  . Hyperlipidemia   . Hypertension     Family History: Family History  Problem Relation Age of Onset  . Hypertension Mother   . Diabetes Mother   . Hypertension Father   . Diabetes Maternal Grandmother   . Diabetes Paternal Grandmother  Review of Systems  Constitutional: Positive for activity change. Negative for chills, fatigue and unexpected weight change.  HENT: Negative for congestion, postnasal drip, rhinorrhea, sneezing and sore throat.   Respiratory: Negative for cough, chest tightness and shortness of breath.   Cardiovascular: Negative for chest pain and palpitations.       Blood pressure improved since last few visits.   Gastrointestinal: Negative for abdominal pain, constipation, diarrhea, nausea and vomiting.  Endocrine: Negative for cold intolerance, heat intolerance,  polydipsia and polyuria.  Musculoskeletal: Positive for back pain, myalgias, neck pain and neck stiffness. Negative for arthralgias and joint swelling.       Improving  Skin: Negative for rash.  Neurological: Positive for headaches. Negative for dizziness, tremors and numbness.  Hematological: Negative for adenopathy. Does not bruise/bleed easily.  Psychiatric/Behavioral: Negative for behavioral problems (Depression), sleep disturbance and suicidal ideas. The patient is nervous/anxious.     Today's Vitals   01/22/19 1539  BP: (!) 148/88  Pulse: 79  Resp: 16  Temp: 97.6 F (36.4 C)  SpO2: 98%  Weight: 214 lb (97.1 kg)  Height: 5\' 10"  (1.778 m)   Body mass index is 30.71 kg/m.  Physical Exam Vitals signs and nursing note reviewed.  Constitutional:      Appearance: Normal appearance. He is well-developed. He is not diaphoretic.  HENT:     Head: Normocephalic and atraumatic.     Nose: Nose normal.     Mouth/Throat:     Pharynx: No oropharyngeal exudate.  Eyes:     Pupils: Pupils are equal, round, and reactive to light.  Neck:     Musculoskeletal: Normal range of motion and neck supple.     Thyroid: No thyromegaly.     Vascular: No JVD.     Trachea: No tracheal deviation.     Comments: Patient is no longer in hard or soft collar to support his neck. He is gradually getting back sensation along the left side of his face and head. Still has a great deal of difficulty turning his head to the left or right, as well as flexing annd extending the neck.  Cardiovascular:     Rate and Rhythm: Normal rate and regular rhythm.     Pulses: Normal pulses.     Heart sounds: Normal heart sounds. No murmur. No friction rub. No gallop.   Pulmonary:     Effort: Pulmonary effort is normal. No respiratory distress.     Breath sounds: Normal breath sounds. No wheezing or rales.  Chest:     Chest wall: No tenderness.  Abdominal:     General: Bowel sounds are normal.     Palpations: Abdomen is  soft.     Tenderness: There is no abdominal tenderness.  Musculoskeletal: Normal range of motion.     Comments: He is having moderate back pain. This made worse with bending and twisting at the waist.   Lymphadenopathy:     Cervical: No cervical adenopathy.  Skin:    General: Skin is warm and dry.  Neurological:     Mental Status: He is alert and oriented to person, place, and time.     Cranial Nerves: No cranial nerve deficit.  Psychiatric:        Behavior: Behavior normal.        Thought Content: Thought content normal.        Judgment: Judgment normal.    Depression screen [facility] 2/9 01/22/2019 12/01/2018 09/21/2018 04/24/2018 03/13/2018  Decreased Interest 0 0 0 0  0  Down, Depressed, Hopeless 0 1 0 0 0  PHQ - 2 Score 0 1 0 0 0    Functional Status Survey: Is the patient deaf or have difficulty hearing?: No Does the patient have difficulty seeing, even when wearing glasses/contacts?: No Does the patient have difficulty concentrating, remembering, or making decisions?: No Does the patient have difficulty walking or climbing stairs?: No Does the patient have difficulty dressing or bathing?: No Does the patient have difficulty doing errands alone such as visiting a doctor's office or shopping?: No  MMSE - Mini Mental State Exam 12/09/2017  Orientation to time 5  Orientation to Place 5  Registration 3  Attention/ Calculation 5  Recall 3  Language- name 2 objects 2  Language- repeat 1  Language- follow 3 step command 3  Language- read & follow direction 1  Write a sentence 1  Copy design 1  Total score 30    Fall Risk  01/22/2019 12/01/2018 09/21/2018 04/24/2018 03/13/2018  Falls in the past year? 0 0 0 0 0  Number falls in past yr: - 0 - - -  Injury with Fall? - 0 - - -      LABS: Recent Results (from the past 2160 hour(s))  POCT Urine Drug Screen     Status: Abnormal   Collection Time: 12/01/18  8:34 AM  Result Value Ref Range   POC METHAMPHETAMINE UR None Detected None  Detected   POC Opiate Ur Positive (A) None Detected   POC Barbiturate UR None Detected None Detected   POC Amphetamine UR None Detected None Detected   POC Oxycodone UR Positive (A) None Detected   POC Cocaine UR None Detected None Detected   POC Ecstasy UR None Detected None Detected   POC TRICYCLICS UR None Detected None Detected   POC PHENCYCLIDINE UR None Detected None Detected   POC MARIJUANA UR None Detected None Detected   POC METHADONE UR None Detected None Detected   POC BENZODIAZEPINES UR None Detected None Detected   URINE TEMPERATURE     POC DRUG SCREEN OXIDANTS URINE     POC SPECIFIC GRAVITY URINE     POC PH URINE     Methylenedioxyamphetamine    UA/M w/rflx Culture, Routine     Status: None   Collection Time: 01/22/19  3:40 PM   Specimen: Urine   URINE  Result Value Ref Range   Specific Gravity, UA 1.013 1.005 - 1.030   pH, UA 5.5 5.0 - 7.5   Color, UA Yellow Yellow   Appearance Ur Clear Clear   Leukocytes,UA Negative Negative   Protein,UA Negative Negative/Trace   Glucose, UA Negative Negative   Ketones, UA Negative Negative   RBC, UA Negative Negative   Bilirubin, UA Negative Negative   Urobilinogen, Ur 0.2 0.2 - 1.0 mg/dL   Nitrite, UA Negative Negative   Microscopic Examination Comment     Comment: Microscopic follows if indicated.   Microscopic Examination See below:     Comment: Microscopic was indicated and was performed.   Urinalysis Reflex Comment     Comment: This specimen will not reflex to a Urine Culture.  Microscopic Examination     Status: None   Collection Time: 01/22/19  3:40 PM   URINE  Result Value Ref Range   WBC, UA 0-5 0 - 5 /hpf   RBC 0-2 0 - 2 /hpf   Epithelial Cells (non renal) None seen 0 - 10 /hpf   Casts None seen None  seen /lpf   Mucus, UA Present Not Estab.   Bacteria, UA None seen None seen/Few  Comprehensive metabolic panel     Status: Abnormal   Collection Time: 01/31/19  9:27 AM  Result Value Ref Range   Glucose 129  (H) 65 - 99 mg/dL   BUN 17 8 - 27 mg/dL   Creatinine, Ser 0.98 0.76 - 1.27 mg/dL   GFR calc non Af Amer 78 >59 mL/min/1.73   GFR calc Af Amer 91 >59 mL/min/1.73   BUN/Creatinine Ratio 17 10 - 24   Sodium 140 134 - 144 mmol/L   Potassium 4.8 3.5 - 5.2 mmol/L   Chloride 104 96 - 106 mmol/L   CO2 23 20 - 29 mmol/L   Calcium 9.5 8.6 - 10.2 mg/dL   Total Protein 7.0 6.0 - 8.5 g/dL   Albumin 4.6 3.8 - 4.8 g/dL   Globulin, Total 2.4 1.5 - 4.5 g/dL   Albumin/Globulin Ratio 1.9 1.2 - 2.2   Bilirubin Total 0.7 0.0 - 1.2 mg/dL   Alkaline Phosphatase 116 39 - 117 IU/L   AST 25 0 - 40 IU/L   ALT 22 0 - 44 IU/L  CBC     Status: None   Collection Time: 01/31/19  9:27 AM  Result Value Ref Range   WBC 6.4 3.4 - 10.8 x10E3/uL   RBC 4.85 4.14 - 5.80 x10E6/uL   Hemoglobin 14.8 13.0 - 17.7 g/dL   Hematocrit 43.6 37.5 - 51.0 %   MCV 90 79 - 97 fL   MCH 30.5 26.6 - 33.0 pg   MCHC 33.9 31.5 - 35.7 g/dL   RDW 12.7 11.6 - 15.4 %   Platelets 243 150 - 450 x10E3/uL  Lipid Panel w/o Chol/HDL Ratio     Status: None   Collection Time: 01/31/19  9:27 AM  Result Value Ref Range   Cholesterol, Total 123 100 - 199 mg/dL   Triglycerides 133 0 - 149 mg/dL   HDL 51 >39 mg/dL   VLDL Cholesterol Cal 23 5 - 40 mg/dL   LDL Chol Calc (NIH) 49 0 - 99 mg/dL  T4, free     Status: None   Collection Time: 01/31/19  9:27 AM  Result Value Ref Range   Free T4 1.25 0.82 - 1.77 ng/dL  TSH     Status: None   Collection Time: 01/31/19  9:27 AM  Result Value Ref Range   TSH 1.080 0.450 - 4.500 uIU/mL  PSA     Status: Abnormal   Collection Time: 01/31/19  9:27 AM  Result Value Ref Range   Prostate Specific Ag, Serum 6.3 (H) 0.0 - 4.0 ng/mL    Comment: Roche ECLIA methodology. According to the American Urological Association, Serum PSA should decrease and remain at undetectable levels after radical prostatectomy. The AUA defines biochemical recurrence as an initial PSA value 0.2 ng/mL or greater followed by a  subsequent confirmatory PSA value 0.2 ng/mL or greater. Values obtained with different assay methods or kits cannot be used interchangeably. Results cannot be interpreted as absolute evidence of the presence or absence of malignant disease.   Hepatitis c antibody (reflex)     Status: None   Collection Time: 01/31/19  9:27 AM  Result Value Ref Range   HCV Ab 0.1 0.0 - 0.9 s/co ratio  HCV Comment:     Status: None   Collection Time: 01/31/19  9:27 AM  Result Value Ref Range   Comment: Comment  Comment: Non reactive HCV antibody screen is consistent with no HCV infection, unless recent infection is suspected or other evidence exists to indicate HCV infection.     Assessment/Plan: 1. Encounter for general adult medical examination with abnormal findings Annual health maintenance exam today.   2. Essential (primary) hypertension Generally well controlled. Continue bp medication as prescribed   3. Other closed nondisplaced fracture of second cervical vertebra with routine healing, subsequent encounter Continue previously prescribed medication for pain without changes today. Will continue for additional 30 days. Reviewed risks and possible side effects associated with taking opiates, benzodiazepines and other CNS depressants. Combination of these could cause dizziness and drowsiness. Advised patient not to drive or operate machinery when taking these medications, as patient's and other's life can be at risk and will have consequences. Patient verbalized understanding in this matter. Dependence and abuse for these drugs will be monitored closely. A Controlled substance policy and procedure is on file which allows [facility] to order a urine drug screen test at any visit. Patient understands and agrees with the plan  4. Allergic rhinitis, unspecified seasonality, unspecified trigger Improved.   5. Encounter for hepatitis C screening test for low risk patient Screening for  hepatitis C on routine, fasting labs.   6. Dysuria - UA/M w/rflx Culture, Routine  General Counseling: Taven verbalizes understanding of the findings of todays visit and agrees with plan of treatment. I have discussed any further diagnostic evaluation that may be needed or ordered today. We also reviewed his medications today. he has been encouraged to call the office with any questions or concerns that should arise related to todays visit.    Counseling:  Hypertension Counseling:   The following hypertensive lifestyle modification were recommended and discussed:  1. Limiting alcohol intake to less than 1 oz/day of ethanol:(24 oz of beer or 8 oz of wine or 2 oz of 100-proof whiskey). 2. Take baby ASA 81 mg daily. 3. Importance of regular aerobic exercise and losing weight. 4. Reduce dietary saturated fat and cholesterol intake for overall cardiovascular health. 5. Maintaining adequate dietary potassium, calcium, and magnesium intake. 6. Regular monitoring of the blood pressure. 7. Reduce sodium intake to less than 100 mmol/day (less than 2.3 gm of sodium or less than 6 gm of sodium choride)   This patient was seen by [place] with Dr Lavera Guise as a part of collaborative care agreement  Orders Placed This Encounter  Procedures  . Microscopic Examination  . UA/M w/rflx Culture, Routine     Time spent: [place], MD  Internal Medicine

## 2019-01-23 LAB — UA/M W/RFLX CULTURE, ROUTINE
Bilirubin, UA: NEGATIVE
Glucose, UA: NEGATIVE
Ketones, UA: NEGATIVE
Leukocytes,UA: NEGATIVE
Nitrite, UA: NEGATIVE
Protein,UA: NEGATIVE
RBC, UA: NEGATIVE
Specific Gravity, UA: 1.013 (ref 1.005–1.030)
Urobilinogen, Ur: 0.2 mg/dL (ref 0.2–1.0)
pH, UA: 5.5 (ref 5.0–7.5)

## 2019-01-23 LAB — MICROSCOPIC EXAMINATION
Bacteria, UA: NONE SEEN
Casts: NONE SEEN /lpf
Epithelial Cells (non renal): NONE SEEN /hpf (ref 0–10)

## 2019-01-24 ENCOUNTER — Other Ambulatory Visit: Payer: Self-pay

## 2019-01-24 MED ORDER — ROSUVASTATIN CALCIUM 20 MG PO TABS: 20.0000 mg | ORAL_TABLET | Freq: Every day | ORAL | 5 refills | Status: DC

## 2019-01-25 ENCOUNTER — Telehealth: Payer: Self-pay

## 2019-01-25 ENCOUNTER — Other Ambulatory Visit: Payer: Self-pay | Admitting: Nurse Practitioner

## 2019-01-25 DIAGNOSIS — M150 Primary generalized (osteo)arthritis: Secondary | ICD-10-CM

## 2019-01-25 MED ORDER — HYDROCODONE-ACETAMINOPHEN 7.5-325 MG PO TABS: 1.0000 | ORAL_TABLET | Freq: Two times a day (BID) | ORAL | 0 refills | Status: DC | PRN

## 2019-01-25 NOTE — Progress Notes (Signed)
Sent refill for hydrocodone/APAP 7.5/325mg  twice daily as needed for pain. Was left off during visit accidentally.

## 2019-01-25 NOTE — Telephone Encounter (Signed)
Sent refill for hydrocodone/APAP 7.5/325mg  twice daily as needed for pain. Was left off during visit accidentally.

## 2019-01-31 ENCOUNTER — Other Ambulatory Visit: Payer: Self-pay | Admitting: Nurse Practitioner

## 2019-01-31 DIAGNOSIS — Z125 Encounter for screening for malignant neoplasm of prostate: Secondary | ICD-10-CM | POA: Diagnosis not present

## 2019-01-31 DIAGNOSIS — Z0001 Encounter for general adult medical examination with abnormal findings: Secondary | ICD-10-CM | POA: Diagnosis not present

## 2019-01-31 DIAGNOSIS — Z1159 Encounter for screening for other viral diseases: Secondary | ICD-10-CM | POA: Diagnosis not present

## 2019-01-31 DIAGNOSIS — E782 Mixed hyperlipidemia: Secondary | ICD-10-CM | POA: Diagnosis not present

## 2019-01-31 DIAGNOSIS — I10 Essential (primary) hypertension: Secondary | ICD-10-CM | POA: Diagnosis not present

## 2019-02-01 DIAGNOSIS — Y93H2 Activity, gardening and landscaping: Secondary | ICD-10-CM | POA: Diagnosis not present

## 2019-02-01 DIAGNOSIS — S12100A Unspecified displaced fracture of second cervical vertebra, initial encounter for closed fracture: Secondary | ICD-10-CM | POA: Diagnosis not present

## 2019-02-01 DIAGNOSIS — M542 Cervicalgia: Secondary | ICD-10-CM | POA: Diagnosis not present

## 2019-02-01 DIAGNOSIS — W228XXA Striking against or struck by other objects, initial encounter: Secondary | ICD-10-CM | POA: Diagnosis not present

## 2019-02-01 LAB — COMPREHENSIVE METABOLIC PANEL
ALT: 22 IU/L (ref 0–44)
AST: 25 IU/L (ref 0–40)
Albumin/Globulin Ratio: 1.9 (ref 1.2–2.2)
Albumin: 4.6 g/dL (ref 3.8–4.8)
Alkaline Phosphatase: 116 IU/L (ref 39–117)
BUN/Creatinine Ratio: 17 (ref 10–24)
BUN: 17 mg/dL (ref 8–27)
Bilirubin Total: 0.7 mg/dL (ref 0.0–1.2)
CO2: 23 mmol/L (ref 20–29)
Calcium: 9.5 mg/dL (ref 8.6–10.2)
Chloride: 104 mmol/L (ref 96–106)
Creatinine, Ser: 0.98 mg/dL (ref 0.76–1.27)
GFR calc Af Amer: 91 mL/min/{1.73_m2} (ref >59)
GFR calc non Af Amer: 78 mL/min/{1.73_m2} (ref >59)
Globulin, Total: 2.4 g/dL (ref 1.5–4.5)
Glucose: 129 mg/dL — ABNORMAL HIGH (ref 65–99)
Potassium: 4.8 mmol/L (ref 3.5–5.2)
Sodium: 140 mmol/L (ref 134–144)
Total Protein: 7 g/dL (ref 6.0–8.5)

## 2019-02-01 LAB — CBC
Hematocrit: 43.6 % (ref 37.5–51.0)
Hemoglobin: 14.8 g/dL (ref 13.0–17.7)
MCH: 30.5 pg (ref 26.6–33.0)
MCHC: 33.9 g/dL (ref 31.5–35.7)
MCV: 90 fL (ref 79–97)
Platelets: 243 10*3/uL (ref 150–450)
RBC: 4.85 x10E6/uL (ref 4.14–5.80)
RDW: 12.7 % (ref 11.6–15.4)
WBC: 6.4 10*3/uL (ref 3.4–10.8)

## 2019-02-01 LAB — LIPID PANEL W/O CHOL/HDL RATIO
Cholesterol, Total: 123 mg/dL (ref 100–199)
HDL: 51 mg/dL (ref >39)
LDL Chol Calc (NIH): 49 mg/dL (ref 0–99)
Triglycerides: 133 mg/dL (ref 0–149)
VLDL Cholesterol Cal: 23 mg/dL (ref 5–40)

## 2019-02-01 LAB — T4, FREE: Free T4: 1.25 ng/dL (ref 0.82–1.77)

## 2019-02-01 LAB — HEPATITIS C ANTIBODY (REFLEX): HCV Ab: 0.1 s/co ratio (ref 0.0–0.9)

## 2019-02-01 LAB — PSA: Prostate Specific Ag, Serum: 6.3 ng/mL — ABNORMAL HIGH (ref 0.0–4.0)

## 2019-02-01 LAB — TSH: TSH: 1.08 u[IU]/mL (ref 0.450–4.500)

## 2019-02-01 LAB — HCV COMMENT:

## 2019-02-04 DIAGNOSIS — Z1159 Encounter for screening for other viral diseases: Secondary | ICD-10-CM | POA: Insufficient documentation

## 2019-02-04 DIAGNOSIS — Z0001 Encounter for general adult medical examination with abnormal findings: Secondary | ICD-10-CM | POA: Insufficient documentation

## 2019-02-04 DIAGNOSIS — R300 Dysuria: Secondary | ICD-10-CM | POA: Insufficient documentation

## 2019-02-21 ENCOUNTER — Telehealth: Payer: Self-pay

## 2019-02-21 NOTE — Telephone Encounter (Signed)
Confirmed and screened patient for 02-23-19 appointment. °

## 2019-02-23 ENCOUNTER — Encounter: Payer: Self-pay | Admitting: Nurse Practitioner

## 2019-02-23 ENCOUNTER — Ambulatory Visit (INDEPENDENT_AMBULATORY_CARE_PROVIDER_SITE_OTHER): Payer: Medicare HMO | Admitting: Nurse Practitioner

## 2019-02-23 ENCOUNTER — Other Ambulatory Visit: Payer: Self-pay

## 2019-02-23 VITALS — BP 169/86 | HR 78 | Temp 97.8°F | Resp 16 | Ht 70.0 in | Wt 219.4 lb

## 2019-02-23 DIAGNOSIS — I10 Essential (primary) hypertension: Secondary | ICD-10-CM | POA: Diagnosis not present

## 2019-02-23 DIAGNOSIS — M150 Primary generalized (osteo)arthritis: Secondary | ICD-10-CM

## 2019-02-23 DIAGNOSIS — Z23 Encounter for immunization: Secondary | ICD-10-CM

## 2019-02-23 DIAGNOSIS — M542 Cervicalgia: Secondary | ICD-10-CM | POA: Diagnosis not present

## 2019-02-23 DIAGNOSIS — N402 Nodular prostate without lower urinary tract symptoms: Secondary | ICD-10-CM | POA: Diagnosis not present

## 2019-02-23 MED ORDER — HYDROCODONE-ACETAMINOPHEN 7.5-325 MG PO TABS: 1.0000 | ORAL_TABLET | Freq: Two times a day (BID) | ORAL | 0 refills | Status: DC | PRN

## 2019-02-23 MED ORDER — TAMSULOSIN HCL 0.4 MG PO CAPS: 0.4000 mg | ORAL_CAPSULE | Freq: Every day | ORAL | 1 refills | Status: DC

## 2019-02-23 NOTE — Progress Notes (Signed)
[facility] [place], [place] [address]  Internal MEDICINE  Office Visit Note  Patient Name: Russell Sullivan  [id number]  NN:[id number]  Date of Service: 02/23/2019  Chief Complaint  Patient presents with  . Hypertension  . Hyperlipidemia    The patient is here for follow up visit. Continues to have severe pain in back of his head and neck. This pain is starting to ease up. Is no longer wearing a soft or hard collar on his neck. He has followed up with orthopedics at [facility]. He does have significant, broad based disc bulging at multiple levels as well as unfused osteophyte. They do not believe this is acute fracture, but very irritated from the recent accident/trauma. He did have a follow up/repeat MRI on 02/01/2019. This showed unchanged ossific density inferior to the C2 inferior endplates favored to represent fractured osteophyte. No spondylolisthesis or dynamic Instability. He does not have to follow up with [facility] any longer unless pain gets worse in his neck.  He states that he has been doing more work around his home. Trying to fix up a trailer for his grandson to live in. He states that with this in mind, pain in the lower back is most significant, more so than pain in his neck. He does continues to take hydrocodone/APAP 7.5/325mg  twice daily when needed. This allows him to remain active in his activities of daily living without negative side effects. He does need a new prescription for this today.  He would like to get his flu shot today.       Current Medication: Outpatient Encounter Medications as of 02/23/2019  Medication Sig  . albuterol (PROVENTIL HFA;VENTOLIN HFA) 108 (90 Base) MCG/ACT inhaler Inhale 2 puffs into the lungs every 6 (six) hours as needed for wheezing or shortness of breath.  Marland Kitchen aspirin EC 81 MG tablet Take 1 tablet by mouth daily.  . flunisolide (NASALIDE) 25 MCG/ACT (0.025%) SOLN Place 2 sprays into the nose 2 (two) times daily.  Marland Kitchen  HYDROcodone-acetaminophen (NORCO) 7.5-325 MG tablet Take 1 tablet by mouth 2 (two) times daily as needed for moderate pain.  Marland Kitchen levocetirizine (XYZAL) 5 MG tablet Take 1 tablet (5 mg total) by mouth every evening.  . meloxicam (MOBIC) 15 MG tablet Take 1 tablet (15 mg total) by mouth daily.  . mometasone (NASONEX) 50 MCG/ACT nasal spray Place 2 sprays into the nose daily.  . rosuvastatin (CRESTOR) 20 MG tablet Take 1 tablet (20 mg total) by mouth at bedtime.  . sildenafil (VIAGRA) 100 MG tablet Take by mouth.  . traMADol (ULTRAM) 50 MG tablet Take 1 tablet (50 mg total) by mouth 2 (two) times daily as needed.  . [DISCONTINUED] HYDROcodone-acetaminophen (NORCO) 7.5-325 MG tablet Take 1 tablet by mouth 2 (two) times daily as needed for moderate pain.  . [DISCONTINUED] HYDROcodone-acetaminophen (NORCO) 7.5-325 MG tablet Take 1 tablet by mouth 2 (two) times daily as needed for moderate pain.  . famotidine (PEPCID) 20 MG tablet Take 1 tablet (20 mg total) by mouth 2 (two) times daily for 7 days.  . tamsulosin (FLOMAX) 0.4 MG CAPS capsule Take 1 capsule (0.4 mg total) by mouth daily.  . [DISCONTINUED] pantoprazole (PROTONIX) 40 MG tablet Take 1 tablet (40 mg total) by mouth daily. (Patient not taking: Reported on 02/23/2019)   No facility-administered encounter medications on file as of 02/23/2019.     Surgical History: Past Surgical History:  Procedure Laterality Date  . HERNIA REPAIR  2009  .  INTUBATION-ENDOTRACHEAL WITH TRACHEOSTOMY STANDBY N/A 01/13/2018   Procedure: INTUBATION-ENDOTRACHEAL WITH TRACHEOSTOMY STANDBY;  Surgeon: Beverly Gust, MD;  Location: [facility] ORS;  Service: ENT;  Laterality: N/A;  . left shoulder surgery      Medical History: Past Medical History:  Diagnosis Date  . Hyperlipidemia   . Hypertension     Family History: Family History  Problem Relation Age of Onset  . Hypertension Mother   . Diabetes Mother   . Hypertension Father   . Diabetes Maternal Grandmother    . Diabetes Paternal Grandmother     Social History   Socioeconomic History  . Marital status: Married    Spouse name: Not on file  . Number of children: Not on file  . Years of education: Not on file  . Highest education level: Not on file  Occupational History  . Not on file  Social Needs  . Financial resource strain: Not on file  . Food insecurity    Worry: Not on file    Inability: Not on file  . Transportation needs    Medical: Not on file    Non-medical: Not on file  Tobacco Use  . Smoking status: Former Smoker    Types: Cigarettes  . Smokeless tobacco: Never Used  Substance and Sexual Activity  . Alcohol use: Yes    Frequency: Never    Comment: ocassionally  . Drug use: No  . Sexual activity: Not on file  Lifestyle  . Physical activity    Days per week: Not on file    Minutes per session: Not on file  . Stress: Not on file  Relationships  . Social Herbalist on phone: Not on file    Gets together: Not on file    Attends religious service: Not on file    Active member of club or organization: Not on file    Attends meetings of clubs or organizations: Not on file    Relationship status: Not on file  . Intimate partner violence    Fear of current or ex partner: Not on file    Emotionally abused: Not on file    Physically abused: Not on file    Forced sexual activity: Not on file  Other Topics Concern  . Not on file  Social History Narrative  . Not on file      Review of Systems  Constitutional: Negative for activity change, chills, fatigue and unexpected weight change.  HENT: Negative for congestion, postnasal drip, rhinorrhea, sneezing and sore throat.   Respiratory: Negative for cough, chest tightness, shortness of breath and wheezing.   Cardiovascular: Negative for chest pain and palpitations.       Blood pressure elevated today.  Gastrointestinal: Negative for abdominal pain, constipation, diarrhea, nausea and vomiting.  Endocrine:  Negative for cold intolerance, heat intolerance, polydipsia and polyuria.  Musculoskeletal: Positive for back pain, myalgias, neck pain and neck stiffness. Negative for arthralgias and joint swelling.       Neck pain improving. Continues to have moderate to severe pain in lower back, especially with exertion.   Skin: Negative for rash.  Neurological: Positive for headaches. Negative for dizziness, tremors and numbness.  Hematological: Negative for adenopathy. Does not bruise/bleed easily.  Psychiatric/Behavioral: Negative for behavioral problems (Depression), sleep disturbance and suicidal ideas. The patient is nervous/anxious.     Today's Vitals   02/23/19 0832  BP: (!) 169/86  Pulse: 78  Resp: 16  Temp: 97.8 F (36.6 C)  SpO2: 98%  Weight: 219 lb 6.4 oz (99.5 kg)  Height: 5\' 10"  (1.778 m)   Body mass index is 31.48 kg/m.  Physical Exam Vitals signs and nursing note reviewed.  Constitutional:      Appearance: Normal appearance. He is well-developed. He is not diaphoretic.  HENT:     Head: Normocephalic and atraumatic.     Nose: Nose normal.     Mouth/Throat:     Pharynx: No oropharyngeal exudate.  Eyes:     Pupils: Pupils are equal, round, and reactive to light.  Neck:     Musculoskeletal: Normal range of motion and neck supple.     Thyroid: No thyromegaly.     Vascular: No JVD.     Trachea: No tracheal deviation.     Comments: Patient is no longer in hard or soft collar to support his neck. He is gradually getting back sensation along the left side of his face and head. Still has a great deal of difficulty turning his head to the left or right, as well as flexing annd extending the neck.  Cardiovascular:     Rate and Rhythm: Normal rate and regular rhythm.     Pulses: Normal pulses.     Heart sounds: Normal heart sounds. No murmur. No friction rub. No gallop.   Pulmonary:     Effort: Pulmonary effort is normal. No respiratory distress.     Breath sounds: Normal breath  sounds. No wheezing or rales.  Chest:     Chest wall: No tenderness.  Abdominal:     Palpations: Abdomen is soft.  Musculoskeletal: Normal range of motion.     Comments: He is having moderate back pain. This made worse with bending and twisting at the waist.   Lymphadenopathy:     Cervical: No cervical adenopathy.  Skin:    General: Skin is warm and dry.  Neurological:     Mental Status: He is alert and oriented to person, place, and time.     Cranial Nerves: No cranial nerve deficit.  Psychiatric:        Behavior: Behavior normal.        Thought Content: Thought content normal.        Judgment: Judgment normal.    Assessment/Plan: 1. Essential (primary) hypertension Generally stable. contineu bp medication as prescribed   2. Neck pain, acute Improving. Reviewed MRI from 02/01/2019 with patient. Will monitor. Does not need follow up with [facility] neurosurgery at this point.   3. Primary generalized (osteo)arthritis May take hydrocodone/APAP 7.5/325mg  twice daily if needed for severe pain. Two 30 day prescriptions wer esent to his pharmacy. Dates are 02/23/2019 and 03/23/2019 - HYDROcodone-acetaminophen (NORCO) 7.5-325 MG tablet; Take 1 tablet by mouth 2 (two) times daily as needed for moderate pain.  Dispense: 60 tablet; Refill: 0  4. Nodular prostate without lower urinary tract symptoms Patient to schedule routine visit with urology - tamsulosin (FLOMAX) 0.4 MG CAPS capsule; Take 1 capsule (0.4 mg total) by mouth daily.  Dispense: 90 capsule; Refill: 1  5. Flu vaccine need - Flu Vaccine  General Counseling: [place] understanding of the findings of todays visit and agrees with plan of treatment. I have discussed any further diagnostic evaluation that may be needed or ordered today. We also reviewed his medications today. he has been encouraged to call the office with any questions or concerns that should arise related to todays visit.  Hypertension Counseling:   The  following hypertensive lifestyle modification were recommended and discussed:  1.  Limiting alcohol intake to less than 1 oz/day of ethanol:(24 oz of beer or 8 oz of wine or 2 oz of 100-proof whiskey). 2. Take baby ASA 81 mg daily. 3. Importance of regular aerobic exercise and losing weight. 4. Reduce dietary saturated fat and cholesterol intake for overall cardiovascular health. 5. Maintaining adequate dietary potassium, calcium, and magnesium intake. 6. Regular monitoring of the blood pressure. 7. Reduce sodium intake to less than 100 mmol/day (less than 2.3 gm of sodium or less than 6 gm of sodium choride)   Reviewed risks and possible side effects associated with taking opiates, benzodiazepines and other CNS depressants. Combination of these could cause dizziness and drowsiness. Advised patient not to drive or operate machinery when taking these medications, as patient's and other's life can be at risk and will have consequences. Patient verbalized understanding in this matter. Dependence and abuse for these drugs will be monitored closely. A Controlled substance policy and procedure is on file which allows [facility] to order a urine drug screen test at any visit. Patient understands and agrees with the plan  This patient was seen by Leretha Pol FNP Collaboration with Dr Lavera Guise as a part of collaborative care agreement  Orders Placed This Encounter  Procedures  . Flu Vaccine    Meds ordered this encounter  Medications  . DISCONTD: HYDROcodone-acetaminophen (NORCO) 7.5-325 MG tablet    Sig: Take 1 tablet by mouth 2 (two) times daily as needed for moderate pain.    Dispense:  60 tablet    Refill:  0    Order Specific Question:   Supervising Provider    Answer:   Lavera Guise [id number]  . HYDROcodone-acetaminophen (NORCO) 7.5-325 MG tablet    Sig: Take 1 tablet by mouth 2 (two) times daily as needed for moderate pain.    Dispense:  60 tablet    Refill:  0    Fill  after 03/23/2019    Order Specific Question:   Supervising Provider    Answer:   Lavera Guise [id number]  . tamsulosin (FLOMAX) 0.4 MG CAPS capsule    Sig: Take 1 capsule (0.4 mg total) by mouth daily.    Dispense:  90 capsule    Refill:  1    Order Specific Question:   Supervising Provider    Answer:   Lavera Guise [[id number]]    Time spent: 44 Minutes      Dr Lavera Guise Internal medicine

## 2019-02-26 ENCOUNTER — Telehealth: Payer: Self-pay | Admitting: Internal Medicine

## 2019-03-16 DIAGNOSIS — Z7982 Long term (current) use of aspirin: Secondary | ICD-10-CM | POA: Diagnosis not present

## 2019-03-16 DIAGNOSIS — R972 Elevated prostate specific antigen [PSA]: Secondary | ICD-10-CM | POA: Diagnosis not present

## 2019-03-16 DIAGNOSIS — N3281 Overactive bladder: Secondary | ICD-10-CM | POA: Diagnosis not present

## 2019-03-25 DIAGNOSIS — R972 Elevated prostate specific antigen [PSA]: Secondary | ICD-10-CM | POA: Diagnosis not present

## 2019-04-24 ENCOUNTER — Telehealth: Payer: Self-pay

## 2019-04-24 NOTE — Telephone Encounter (Signed)
CONFIRMED 04-26-19 OV AS VIRTUAL. 

## 2019-04-26 ENCOUNTER — Ambulatory Visit (INDEPENDENT_AMBULATORY_CARE_PROVIDER_SITE_OTHER): Payer: Medicare HMO | Admitting: Nurse Practitioner

## 2019-04-26 ENCOUNTER — Other Ambulatory Visit: Payer: Self-pay | Admitting: Nurse Practitioner

## 2019-04-26 ENCOUNTER — Other Ambulatory Visit: Payer: Self-pay

## 2019-04-26 ENCOUNTER — Encounter: Payer: Self-pay | Admitting: Nurse Practitioner

## 2019-04-26 VITALS — BP 154/92 | HR 73 | Temp 97.9°F

## 2019-04-26 DIAGNOSIS — M150 Primary generalized (osteo)arthritis: Secondary | ICD-10-CM

## 2019-04-26 DIAGNOSIS — N400 Enlarged prostate without lower urinary tract symptoms: Secondary | ICD-10-CM

## 2019-04-26 DIAGNOSIS — I10 Essential (primary) hypertension: Secondary | ICD-10-CM | POA: Diagnosis not present

## 2019-04-26 MED ORDER — HYDROCODONE-ACETAMINOPHEN 7.5-325 MG PO TABS: 1.0000 | ORAL_TABLET | Freq: Two times a day (BID) | ORAL | 0 refills | Status: DC | PRN

## 2019-04-26 MED ORDER — TRAMADOL HCL 50 MG PO TABS: 50.0000 mg | ORAL_TABLET | Freq: Two times a day (BID) | ORAL | 3 refills | Status: DC | PRN

## 2019-04-26 NOTE — Progress Notes (Signed)
Renewed tramadol for mild to moderate pain. Sent to [facility] per pharmacy request

## 2019-04-26 NOTE — Progress Notes (Signed)
[facility] [place], [place] [address]  Internal MEDICINE  Telephone Visit  Patient Name: Russell Sullivan  [id number]  [id number]  Date of Service: 04/26/2019  I connected with the patient at 9:07am by webcam and verified the patients identity using two identifiers.   I discussed the limitations, risks, security and privacy concerns of performing an evaluation and management service by webcam and the availability of in person appointments. I also discussed with the patient that there may be a patient responsible charge related to the service.  The patient expressed understanding and agrees to proceed.    Chief Complaint  Patient presents with  . Telephone Assessment  . Telephone Screen  . Hypertension  . Biopsy    pt got a prostate biopsy and has questions, psa has dropped and wonders why they are continuing to do mri's etc, would like to see if he can get psa tested today   . Medication Refill    tramadol    The patient has been contacted via webcam for follow up visit due to concerns for spread of novel coronavirus. The patient presents for follow up visit. He Is scheduled to have prostate biopsy on 05/02/2019. He states that PSA levels have come down is thinking he could check PSA again prior to biopsies. He also had MRI of the prostate done. This MRI is showing three nodular densities in the prostate. Two are classified as BIRADS 4 and one is classified BIRADs 3.  He states that he has been doing more work around his home. Trying to fix up a trailer for his grandson to live in. He states that with this in mind, pain in the lower back is most significant, more so than pain in his neck. He does continues to take hydrocodone/APAP 7.5/325mg  twice daily when needed. This allows him to remain active in his activities of daily living without negative side effects. He does need a new prescription for this today.        Current Medication: Outpatient Encounter  Medications as of 04/26/2019  Medication Sig  . albuterol (PROVENTIL HFA;VENTOLIN HFA) 108 (90 Base) MCG/ACT inhaler Inhale 2 puffs into the lungs every 6 (six) hours as needed for wheezing or shortness of breath.  Marland Kitchen aspirin EC 81 MG tablet Take 1 tablet by mouth daily.  . flunisolide (NASALIDE) 25 MCG/ACT (0.025%) SOLN Place 2 sprays into the nose 2 (two) times daily.  Marland Kitchen HYDROcodone-acetaminophen (NORCO) 7.5-325 MG tablet Take 1 tablet by mouth 2 (two) times daily as needed for moderate pain.  Marland Kitchen levocetirizine (XYZAL) 5 MG tablet Take 1 tablet (5 mg total) by mouth every evening.  . meloxicam (MOBIC) 15 MG tablet Take 1 tablet (15 mg total) by mouth daily.  . mometasone (NASONEX) 50 MCG/ACT nasal spray Place 2 sprays into the nose daily.  . rosuvastatin (CRESTOR) 20 MG tablet Take 1 tablet (20 mg total) by mouth at bedtime.  . sildenafil (VIAGRA) 100 MG tablet Take by mouth.  . tamsulosin (FLOMAX) 0.4 MG CAPS capsule Take 1 capsule (0.4 mg total) by mouth daily.  . traMADol (ULTRAM) 50 MG tablet Take 1 tablet (50 mg total) by mouth 2 (two) times daily as needed.  . [DISCONTINUED] HYDROcodone-acetaminophen (NORCO) 7.5-325 MG tablet Take 1 tablet by mouth 2 (two) times daily as needed for moderate pain.  . [DISCONTINUED] HYDROcodone-acetaminophen (NORCO) 7.5-325 MG tablet Take 1 tablet by mouth 2 (two) times daily as needed for moderate pain.  . famotidine (PEPCID)  20 MG tablet Take 1 tablet (20 mg total) by mouth 2 (two) times daily for 7 days.   No facility-administered encounter medications on file as of 04/26/2019.    Surgical History: Past Surgical History:  Procedure Laterality Date  . HERNIA REPAIR  2009  . INTUBATION-ENDOTRACHEAL WITH TRACHEOSTOMY STANDBY N/A 01/13/2018   Procedure: INTUBATION-ENDOTRACHEAL WITH TRACHEOSTOMY STANDBY;  Surgeon: Beverly Gust, MD;  Location: [facility] ORS;  Service: ENT;  Laterality: N/A;  . left shoulder surgery      Medical History: Past Medical  History:  Diagnosis Date  . Hyperlipidemia   . Hypertension     Family History: Family History  Problem Relation Age of Onset  . Hypertension Mother   . Diabetes Mother   . Hypertension Father   . Diabetes Maternal Grandmother   . Diabetes Paternal Grandmother     Social History   Socioeconomic History  . Marital status: Married    Spouse name: Not on file  . Number of children: Not on file  . Years of education: Not on file  . Highest education level: Not on file  Occupational History  . Not on file  Tobacco Use  . Smoking status: Former Smoker    Types: Cigarettes  . Smokeless tobacco: Never Used  Substance and Sexual Activity  . Alcohol use: Yes    Comment: ocassionally  . Drug use: No  . Sexual activity: Not on file  Other Topics Concern  . Not on file  Social History Narrative  . Not on file   Social Determinants of Health   Financial Resource Strain:   . Difficulty of Paying Living Expenses: Not on file  Food Insecurity:   . Worried About Charity fundraiser in the Last Year: Not on file  . Ran Out of Food in the Last Year: Not on file  Transportation Needs:   . Lack of Transportation (Medical): Not on file  . Lack of Transportation (Non-Medical): Not on file  Physical Activity:   . Days of Exercise per Week: Not on file  . Minutes of Exercise per Session: Not on file  Stress:   . Feeling of Stress : Not on file  Social Connections:   . Frequency of Communication with Friends and Family: Not on file  . Frequency of Social Gatherings with Friends and Family: Not on file  . Attends Religious Services: Not on file  . Active Member of Clubs or Organizations: Not on file  . Attends Archivist Meetings: Not on file  . Marital Status: Not on file  Intimate Partner Violence:   . Fear of Current or Ex-Partner: Not on file  . Emotionally Abused: Not on file  . Physically Abused: Not on file  . Sexually Abused: Not on file      Review of  Systems  Constitutional: Negative for activity change, chills, fatigue and unexpected weight change.  HENT: Negative for congestion, postnasal drip, rhinorrhea, sneezing and sore throat.   Respiratory: Negative for cough, chest tightness, shortness of breath and wheezing.   Cardiovascular: Negative for chest pain and palpitations.       Blood pressure elevated today.  Gastrointestinal: Negative for abdominal pain, constipation, diarrhea, nausea and vomiting.  Endocrine: Negative for cold intolerance, heat intolerance, polydipsia and polyuria.  Musculoskeletal: Positive for back pain, myalgias, neck pain and neck stiffness. Negative for arthralgias and joint swelling.       Neck pain improving. Continues to have moderate to severe pain in lower back,  especially with exertion.   Skin: Negative for rash.  Neurological: Positive for headaches. Negative for dizziness, tremors and numbness.  Hematological: Negative for adenopathy. Does not bruise/bleed easily.  Psychiatric/Behavioral: Negative for behavioral problems (Depression), sleep disturbance and suicidal ideas. The patient is nervous/anxious.     Vital Signs: BP (!) 154/92   Pulse 73   Temp 97.9 F (36.6 C)    Observation/Objective:  The patient is alert and oriented. He is pleasant and answering all questions appropriately. Breathing is non-labored. He is in no acute distress. The patient appears worried and concerned    Assessment/Plan: 1. Essential (primary) hypertension Generally stable, however, elevated this morning. Continue to monitor closely   2. Primary generalized (osteo)arthritis Holding meloxicam until 24 to 48 hours after prostate biopsy, scheduled 05/02/2019. May conintue hydrocodone/APAP 7.5/325mg  tablets. May be taken up to twice daily if needed for moderate to severe pain. Two 30 day prescriptions provided to his pharmacy. Dates are 04/26/2019 and 05/25/2019.  - HYDROcodone-acetaminophen (NORCO) 7.5-325 MG tablet;  Take 1 tablet by mouth 2 (two) times daily as needed for moderate pain.  Dispense: 60 tablet; Refill: 0  3. Prostatic hyperplasia Discussed prostate MRI and labs done per [facility] urology. There are three nocular densities which are concerning. Advised patient he should have biopsy and not wait to have another check of PSA. He agreed and will go ahead with procedure.   General Counseling: Eligha verbalizes understanding of the findings of today's phone visit and agrees with plan of treatment. I have discussed any further diagnostic evaluation that may be needed or ordered today. We also reviewed his medications today. he has been encouraged to call the office with any questions or concerns that should arise related to todays visit.    Reviewed risks and possible side effects associated with taking opiates, benzodiazepines and other CNS depressants. Combination of these could cause dizziness and drowsiness. Advised patient not to drive or operate machinery when taking these medications, as patient's and other's life can be at risk and will have consequences. Patient verbalized understanding in this matter. Dependence and abuse for these drugs will be monitored closely. A Controlled substance policy and procedure is on file which allows [facility] to order a urine drug screen test at any visit. Patient understands and agrees with the plan  This patient was seen by Leretha Pol FNP Collaboration with Dr Lavera Guise as a part of collaborative care agreement  Meds ordered this encounter  Medications  . DISCONTD: HYDROcodone-acetaminophen (NORCO) 7.5-325 MG tablet    Sig: Take 1 tablet by mouth 2 (two) times daily as needed for moderate pain.    Dispense:  60 tablet    Refill:  0    Order Specific Question:   Supervising Provider    Answer:   Lavera Guise [id number]  . HYDROcodone-acetaminophen (NORCO) 7.5-325 MG tablet    Sig: Take 1 tablet by mouth 2 (two) times daily as needed for moderate  pain.    Dispense:  60 tablet    Refill:  0    Fill after 05/25/2019    Order Specific Question:   Supervising Provider    Answer:   Lavera Guise [id number]    Time spent: 23 Minutes    Dr Lavera Guise Internal medicine

## 2019-05-02 DIAGNOSIS — R972 Elevated prostate specific antigen [PSA]: Secondary | ICD-10-CM | POA: Diagnosis not present

## 2019-05-10 DIAGNOSIS — R972 Elevated prostate specific antigen [PSA]: Secondary | ICD-10-CM | POA: Diagnosis not present

## 2019-05-10 DIAGNOSIS — N3281 Overactive bladder: Secondary | ICD-10-CM | POA: Diagnosis not present

## 2019-06-20 ENCOUNTER — Other Ambulatory Visit: Payer: Self-pay

## 2019-06-20 MED ORDER — ROSUVASTATIN CALCIUM 20 MG PO TABS: 20.0000 mg | ORAL_TABLET | Freq: Every day | ORAL | 5 refills | Status: DC

## 2019-06-22 ENCOUNTER — Telehealth: Payer: Self-pay

## 2019-06-22 NOTE — Telephone Encounter (Signed)
CONFIRMED AND SCREENED FOR 06-26-19 OV. 

## 2019-06-26 ENCOUNTER — Encounter: Payer: Self-pay | Admitting: Nurse Practitioner

## 2019-06-26 ENCOUNTER — Other Ambulatory Visit: Payer: Self-pay

## 2019-06-26 ENCOUNTER — Ambulatory Visit (INDEPENDENT_AMBULATORY_CARE_PROVIDER_SITE_OTHER): Payer: Medicare HMO | Admitting: Nurse Practitioner

## 2019-06-26 VITALS — BP 168/82 | HR 80 | Temp 97.4°F | Resp 16 | Ht 70.0 in | Wt 218.6 lb

## 2019-06-26 DIAGNOSIS — N402 Nodular prostate without lower urinary tract symptoms: Secondary | ICD-10-CM | POA: Diagnosis not present

## 2019-06-26 DIAGNOSIS — M150 Primary generalized (osteo)arthritis: Secondary | ICD-10-CM

## 2019-06-26 DIAGNOSIS — I10 Essential (primary) hypertension: Secondary | ICD-10-CM | POA: Diagnosis not present

## 2019-06-26 DIAGNOSIS — E782 Mixed hyperlipidemia: Secondary | ICD-10-CM | POA: Diagnosis not present

## 2019-06-26 MED ORDER — HYDROCODONE-ACETAMINOPHEN 7.5-325 MG PO TABS: 1.0000 | ORAL_TABLET | Freq: Two times a day (BID) | ORAL | 0 refills | Status: DC | PRN

## 2019-06-26 MED ORDER — AMLODIPINE BESYLATE 2.5 MG PO TABS: 2.5000 mg | ORAL_TABLET | Freq: Every day | ORAL | 3 refills | Status: DC

## 2019-06-26 NOTE — Progress Notes (Signed)
[facility] [place], [place] [address]  Internal MEDICINE  Office Visit Note  Patient Name: Russell Sullivan  [id number]  NN:[id number]  Date of Service: 07/01/2019  Chief Complaint  Patient presents with  . Hypertension  . Hyperlipidemia    The patient is here for routine follow up. Blood pressure is mildly elevated today. States that he got both Moderna cOVID 19 vaccines. Second dose, he did run low grade fever, had body aches, and still has moderate fatigue. The patient continues to have pain in the lower back is most significant, more so than pain in his neck. He does continues to take hydrocodone/APAP 7.5/325mg  twice daily when needed. This allows him to remain active in his activities of daily living without negative side effects. He does need a new prescription for this today.  Blood pressure is elevated today. Has been mildly elevated the past few visits. He denies chest pain, chest pressure, shortness of breath, or headaches.        Current Medication: Outpatient Encounter Medications as of 06/26/2019  Medication Sig  . albuterol (PROVENTIL HFA;VENTOLIN HFA) 108 (90 Base) MCG/ACT inhaler Inhale 2 puffs into the lungs every 6 (six) hours as needed for wheezing or shortness of breath.  Marland Kitchen aspirin EC 81 MG tablet Take 1 tablet by mouth daily.  . flunisolide (NASALIDE) 25 MCG/ACT (0.025%) SOLN Place 2 sprays into the nose 2 (two) times daily.  Marland Kitchen HYDROcodone-acetaminophen (NORCO) 7.5-325 MG tablet Take 1 tablet by mouth 2 (two) times daily as needed for moderate pain.  Marland Kitchen levocetirizine (XYZAL) 5 MG tablet Take 1 tablet (5 mg total) by mouth every evening.  . meloxicam (MOBIC) 15 MG tablet Take 1 tablet (15 mg total) by mouth daily.  . mometasone (NASONEX) 50 MCG/ACT nasal spray Place 2 sprays into the nose daily.  . rosuvastatin (CRESTOR) 20 MG tablet Take 1 tablet (20 mg total) by mouth at bedtime.  . sildenafil (VIAGRA) 100 MG tablet Take by mouth.  .  solifenacin (VESICARE) 5 MG tablet Take 5 mg by mouth daily.  . tamsulosin (FLOMAX) 0.4 MG CAPS capsule Take 1 capsule (0.4 mg total) by mouth daily.  . traMADol (ULTRAM) 50 MG tablet Take 1 tablet (50 mg total) by mouth 2 (two) times daily as needed.  . [DISCONTINUED] HYDROcodone-acetaminophen (NORCO) 7.5-325 MG tablet Take 1 tablet by mouth 2 (two) times daily as needed for moderate pain.  Marland Kitchen amLODipine (NORVASC) 2.5 MG tablet Take 1 tablet (2.5 mg total) by mouth daily.  . famotidine (PEPCID) 20 MG tablet Take 1 tablet (20 mg total) by mouth 2 (two) times daily for 7 days.   No facility-administered encounter medications on file as of 06/26/2019.    Surgical History: Past Surgical History:  Procedure Laterality Date  . HERNIA REPAIR  2009  . INTUBATION-ENDOTRACHEAL WITH TRACHEOSTOMY STANDBY N/A 01/13/2018   Procedure: INTUBATION-ENDOTRACHEAL WITH TRACHEOSTOMY STANDBY;  Surgeon: Beverly Gust, MD;  Location: [facility] ORS;  Service: ENT;  Laterality: N/A;  . left shoulder surgery      Medical History: Past Medical History:  Diagnosis Date  . Hyperlipidemia   . Hypertension     Family History: Family History  Problem Relation Age of Onset  . Hypertension Mother   . Diabetes Mother   . Hypertension Father   . Diabetes Maternal Grandmother   . Diabetes Paternal Grandmother     Social History   Socioeconomic History  . Marital status: Married    Spouse name: Not on  file  . Number of children: Not on file  . Years of education: Not on file  . Highest education level: Not on file  Occupational History  . Not on file  Tobacco Use  . Smoking status: Former Smoker    Types: Cigarettes  . Smokeless tobacco: Never Used  Substance and Sexual Activity  . Alcohol use: Yes    Comment: ocassionally  . Drug use: No  . Sexual activity: Not on file  Other Topics Concern  . Not on file  Social History Narrative  . Not on file   Social Determinants of Health   Financial Resource  Strain:   . Difficulty of Paying Living Expenses:   Food Insecurity:   . Worried About Charity fundraiser in the Last Year:   . Arboriculturist in the Last Year:   Transportation Needs:   . Film/video editor (Medical):   Marland Kitchen Lack of Transportation (Non-Medical):   Physical Activity:   . Days of Exercise per Week:   . Minutes of Exercise per Session:   Stress:   . Feeling of Stress :   Social Connections:   . Frequency of Communication with Friends and Family:   . Frequency of Social Gatherings with Friends and Family:   . Attends Religious Services:   . Active Member of Clubs or Organizations:   . Attends Archivist Meetings:   Marland Kitchen Marital Status:   Intimate Partner Violence:   . Fear of Current or Ex-Partner:   . Emotionally Abused:   Marland Kitchen Physically Abused:   . Sexually Abused:       Review of Systems  Constitutional: Negative for activity change, chills, fatigue and unexpected weight change.  HENT: Negative for congestion, postnasal drip, rhinorrhea, sneezing and sore throat.   Respiratory: Negative for cough, chest tightness, shortness of breath and wheezing.   Cardiovascular: Negative for chest pain and palpitations.       Blood pressure elevated today.  Gastrointestinal: Negative for abdominal pain, constipation, diarrhea, nausea and vomiting.  Endocrine: Negative for cold intolerance, heat intolerance, polydipsia and polyuria.  Musculoskeletal: Positive for back pain, myalgias, neck pain and neck stiffness. Negative for arthralgias and joint swelling.       Neck pain improving. Continues to have moderate to severe pain in lower back, especially with exertion.   Skin: Negative for rash.  Allergic/Immunologic: Negative for environmental allergies.  Neurological: Positive for headaches. Negative for dizziness, tremors and numbness.  Hematological: Negative for adenopathy. Does not bruise/bleed easily.  Psychiatric/Behavioral: Negative for behavioral problems  (Depression), sleep disturbance and suicidal ideas. The patient is nervous/anxious.     Today's Vitals   06/26/19 0931  BP: (!) 168/82  Pulse: 80  Resp: 16  Temp: (!) 97.4 F (36.3 C)  SpO2: 97%  Weight: 218 lb 9.6 oz (99.2 kg)  Height: 5\' 10"  (1.778 m)   Body mass index is 31.37 kg/m.  Physical Exam Vitals and nursing note reviewed.  Constitutional:      Appearance: Normal appearance. He is well-developed. He is not diaphoretic.  HENT:     Head: Normocephalic and atraumatic.     Nose: Nose normal.     Mouth/Throat:     Pharynx: No oropharyngeal exudate.  Eyes:     Pupils: Pupils are equal, round, and reactive to light.  Neck:     Thyroid: No thyromegaly.     Vascular: No JVD.     Trachea: No tracheal deviation.  Comments: Still has a great deal of difficulty turning his head to the left or right, as well as flexing annd extending the neck.  Cardiovascular:     Rate and Rhythm: Normal rate and regular rhythm.     Pulses: Normal pulses.     Heart sounds: Normal heart sounds. No murmur. No friction rub. No gallop.   Pulmonary:     Effort: Pulmonary effort is normal. No respiratory distress.     Breath sounds: Normal breath sounds. No wheezing or rales.  Chest:     Chest wall: No tenderness.  Abdominal:     Palpations: Abdomen is soft.  Musculoskeletal:        General: Normal range of motion.     Cervical back: Normal range of motion and neck supple.     Comments: He is having moderate back pain. This made worse with bending and twisting at the waist.   Lymphadenopathy:     Cervical: No cervical adenopathy.  Skin:    General: Skin is warm and dry.  Neurological:     Mental Status: He is alert and oriented to person, place, and time.     Cranial Nerves: No cranial nerve deficit.  Psychiatric:        Behavior: Behavior normal.        Thought Content: Thought content normal.        Judgment: Judgment normal.    Assessment/Plan: 1. Essential (primary)  hypertension Started amlodipine 2.5mg  tablets daily. Limit salt intake and increase water in the diet. Monitor closely.  - amLODipine (NORVASC) 2.5 MG tablet; Take 1 tablet (2.5 mg total) by mouth daily.  Dispense: 30 tablet; Refill: 3  2. Mixed hyperlipidemia Continue crestor as prescribed   3. Primary generalized (osteo)arthritis May take hydrocodone/APAP 7.5/325mg  tablets twice daily as needed for moderate to severe pain. A new prescription was sent to his pharmacy today.  - HYDROcodone-acetaminophen (NORCO) 7.5-325 MG tablet; Take 1 tablet by mouth 2 (two) times daily as needed for moderate pain.  Dispense: 60 tablet; Refill: 0  4. Nodular prostate without lower urinary tract symptoms Continues to see urology as scheduled.   General Counseling: Russell Sullivan verbalizes understanding of the findings of todays visit and agrees with plan of treatment. I have discussed any further diagnostic evaluation that may be needed or ordered today. We also reviewed his medications today. he has been encouraged to call the office with any questions or concerns that should arise related to todays visit.   Hypertension Counseling:   The following hypertensive lifestyle modification were recommended and discussed:  1. Limiting alcohol intake to less than 1 oz/day of ethanol:(24 oz of beer or 8 oz of wine or 2 oz of 100-proof whiskey). 2. Take baby ASA 81 mg daily. 3. Importance of regular aerobic exercise and losing weight. 4. Reduce dietary saturated fat and cholesterol intake for overall cardiovascular health. 5. Maintaining adequate dietary potassium, calcium, and magnesium intake. 6. Regular monitoring of the blood pressure. 7. Reduce sodium intake to less than 100 mmol/day (less than 2.3 gm of sodium or less than 6 gm of sodium choride)   This patient was seen by [facility] with Dr Lavera Guise as a part of collaborative care agreement  Meds ordered this encounter  Medications  .  amLODipine (NORVASC) 2.5 MG tablet    Sig: Take 1 tablet (2.5 mg total) by mouth daily.    Dispense:  30 tablet    Refill:  3  Order Specific Question:   Supervising Provider    Answer:   Lavera Guise [id number]  . HYDROcodone-acetaminophen (NORCO) 7.5-325 MG tablet    Sig: Take 1 tablet by mouth 2 (two) times daily as needed for moderate pain.    Dispense:  60 tablet    Refill:  0    Fill after 05/25/2019    Order Specific Question:   Supervising Provider    Answer:   Lavera Guise [id number]    Total time spent: 30 Minutes   Time spent includes review of chart, medications, test results, and follow up plan with the patient.      Dr Lavera Guise Internal medicine

## 2019-07-25 ENCOUNTER — Telehealth: Payer: Self-pay

## 2019-07-25 NOTE — Telephone Encounter (Signed)
No ring and no vm was unable to confirm or screen for 07-27-19 ov.

## 2019-07-27 ENCOUNTER — Encounter: Payer: Self-pay | Admitting: Nurse Practitioner

## 2019-07-27 ENCOUNTER — Ambulatory Visit (INDEPENDENT_AMBULATORY_CARE_PROVIDER_SITE_OTHER): Payer: Medicare HMO | Admitting: Nurse Practitioner

## 2019-07-27 ENCOUNTER — Other Ambulatory Visit: Payer: Self-pay

## 2019-07-27 VITALS — BP 155/79 | HR 82 | Temp 97.7°F | Resp 16 | Ht 70.0 in | Wt 220.0 lb

## 2019-07-27 DIAGNOSIS — M542 Cervicalgia: Secondary | ICD-10-CM

## 2019-07-27 DIAGNOSIS — M150 Primary generalized (osteo)arthritis: Secondary | ICD-10-CM

## 2019-07-27 DIAGNOSIS — I10 Essential (primary) hypertension: Secondary | ICD-10-CM | POA: Diagnosis not present

## 2019-07-27 MED ORDER — HYDROCODONE-ACETAMINOPHEN 7.5-325 MG PO TABS: 1.0000 | ORAL_TABLET | Freq: Two times a day (BID) | ORAL | 0 refills | Status: DC | PRN

## 2019-07-27 NOTE — Progress Notes (Signed)
[facility] [place], [place] [address]  Internal MEDICINE  Office Visit Note  Patient Name: Russell Sullivan  [id number]  NN:[id number]  Date of Service: 08/08/2019  Chief Complaint  Patient presents with  . Hypertension    added amlodipine     The patient is here fr follow up regarding blood pressure. Was started on amlodipine 2.5mg  daily. Blood pressure is mildly improved in the office. Has been keeping close eye on it at home. He states that it is usually running about 130/80. He is tolerating the additional medication well. No negative side effects to report.  He continues to have pain in the lower back and his neck. This is most severe with exertion. He is slowly returning to his normal activities.   He does continues to take hydrocodone/APAP 7.5/325mg  twice daily when needed. This allows him to remain active in his activities of daily living without negative side effects. He does need a new prescription for this today.        Current Medication: Outpatient Encounter Medications as of 07/27/2019  Medication Sig  . albuterol (PROVENTIL HFA;VENTOLIN HFA) 108 (90 Base) MCG/ACT inhaler Inhale 2 puffs into the lungs every 6 (six) hours as needed for wheezing or shortness of breath.  Marland Kitchen amLODipine (NORVASC) 2.5 MG tablet Take 1 tablet (2.5 mg total) by mouth daily.  Marland Kitchen aspirin EC 81 MG tablet Take 1 tablet by mouth daily.  . flunisolide (NASALIDE) 25 MCG/ACT (0.025%) SOLN Place 2 sprays into the nose 2 (two) times daily.  Marland Kitchen HYDROcodone-acetaminophen (NORCO) 7.5-325 MG tablet Take 1 tablet by mouth 2 (two) times daily as needed for moderate pain.  Marland Kitchen levocetirizine (XYZAL) 5 MG tablet Take 1 tablet (5 mg total) by mouth every evening.  . meloxicam (MOBIC) 15 MG tablet Take 1 tablet (15 mg total) by mouth daily.  . mometasone (NASONEX) 50 MCG/ACT nasal spray Place 2 sprays into the nose daily.  . rosuvastatin (CRESTOR) 20 MG tablet Take 1 tablet (20 mg total) by mouth  at bedtime.  . sildenafil (VIAGRA) 100 MG tablet Take by mouth.  . solifenacin (VESICARE) 5 MG tablet Take 5 mg by mouth daily.  . tamsulosin (FLOMAX) 0.4 MG CAPS capsule Take 1 capsule (0.4 mg total) by mouth daily.  . traMADol (ULTRAM) 50 MG tablet Take 1 tablet (50 mg total) by mouth 2 (two) times daily as needed.  . [DISCONTINUED] HYDROcodone-acetaminophen (NORCO) 7.5-325 MG tablet Take 1 tablet by mouth 2 (two) times daily as needed for moderate pain.  . [DISCONTINUED] HYDROcodone-acetaminophen (NORCO) 7.5-325 MG tablet Take 1 tablet by mouth 2 (two) times daily as needed for moderate pain.  . famotidine (PEPCID) 20 MG tablet Take 1 tablet (20 mg total) by mouth 2 (two) times daily for 7 days.   No facility-administered encounter medications on file as of 07/27/2019.    Surgical History: Past Surgical History:  Procedure Laterality Date  . HERNIA REPAIR  2009  . INTUBATION-ENDOTRACHEAL WITH TRACHEOSTOMY STANDBY N/A 01/13/2018   Procedure: INTUBATION-ENDOTRACHEAL WITH TRACHEOSTOMY STANDBY;  Surgeon: Beverly Gust, MD;  Location: [facility] ORS;  Service: ENT;  Laterality: N/A;  . left shoulder surgery      Medical History: Past Medical History:  Diagnosis Date  . Hyperlipidemia   . Hypertension     Family History: Family History  Problem Relation Age of Onset  . Hypertension Mother   . Diabetes Mother   . Hypertension Father   . Diabetes Maternal Grandmother   .  Diabetes Paternal Grandmother     Social History   Socioeconomic History  . Marital status: Married    Spouse name: Not on file  . Number of children: Not on file  . Years of education: Not on file  . Highest education level: Not on file  Occupational History  . Not on file  Tobacco Use  . Smoking status: Former Smoker    Types: Cigarettes  . Smokeless tobacco: Never Used  Substance and Sexual Activity  . Alcohol use: Yes    Comment: ocassionally  . Drug use: No  . Sexual activity: Not on file  Other  Topics Concern  . Not on file  Social History Narrative  . Not on file   Social Determinants of Health   Financial Resource Strain:   . Difficulty of Paying Living Expenses:   Food Insecurity:   . Worried About Charity fundraiser in the Last Year:   . Arboriculturist in the Last Year:   Transportation Needs:   . Film/video editor (Medical):   Marland Kitchen Lack of Transportation (Non-Medical):   Physical Activity:   . Days of Exercise per Week:   . Minutes of Exercise per Session:   Stress:   . Feeling of Stress :   Social Connections:   . Frequency of Communication with Friends and Family:   . Frequency of Social Gatherings with Friends and Family:   . Attends Religious Services:   . Active Member of Clubs or Organizations:   . Attends Archivist Meetings:   Marland Kitchen Marital Status:   Intimate Partner Violence:   . Fear of Current or Ex-Partner:   . Emotionally Abused:   Marland Kitchen Physically Abused:   . Sexually Abused:       Review of Systems  Constitutional: Negative for activity change, chills, fatigue and unexpected weight change.  HENT: Negative for congestion, postnasal drip, rhinorrhea, sneezing and sore throat.   Respiratory: Negative for cough, chest tightness, shortness of breath and wheezing.   Cardiovascular: Negative for chest pain and palpitations.       Improved blood pressure.  Gastrointestinal: Negative for abdominal pain, constipation, diarrhea, nausea and vomiting.  Endocrine: Negative for cold intolerance, heat intolerance, polydipsia and polyuria.  Musculoskeletal: Positive for back pain, myalgias, neck pain and neck stiffness. Negative for arthralgias and joint swelling.       Neck pain improving. Continues to have moderate to severe pain in lower back, especially with exertion.   Skin: Negative for rash.  Allergic/Immunologic: Negative for environmental allergies.  Neurological: Positive for headaches. Negative for dizziness, tremors and numbness.   Hematological: Negative for adenopathy. Does not bruise/bleed easily.  Psychiatric/Behavioral: Negative for behavioral problems (Depression), sleep disturbance and suicidal ideas. The patient is nervous/anxious.     Today's Vitals   07/27/19 1121  BP: (!) 155/79  Pulse: 82  Resp: 16  Temp: 97.7 F (36.5 C)  SpO2: 99%  Weight: 220 lb (99.8 kg)  Height: 5\' 10"  (1.778 m)   Body mass index is 31.57 kg/m.  Physical Exam Vitals and nursing note reviewed.  Constitutional:      Appearance: Normal appearance. He is well-developed. He is not diaphoretic.  HENT:     Head: Normocephalic and atraumatic.     Nose: Nose normal.     Mouth/Throat:     Pharynx: No oropharyngeal exudate.  Eyes:     Pupils: Pupils are equal, round, and reactive to light.  Neck:  Thyroid: No thyromegaly.     Vascular: No JVD.     Trachea: No tracheal deviation.     Comments: Still has a great deal of difficulty turning his head to the left or right, as well as flexing annd extending the neck.  Cardiovascular:     Rate and Rhythm: Normal rate and regular rhythm.     Pulses: Normal pulses.     Heart sounds: Normal heart sounds. No murmur. No friction rub. No gallop.   Pulmonary:     Effort: Pulmonary effort is normal. No respiratory distress.     Breath sounds: Normal breath sounds. No wheezing or rales.  Chest:     Chest wall: No tenderness.  Abdominal:     Palpations: Abdomen is soft.  Musculoskeletal:        General: Normal range of motion.     Cervical back: Normal range of motion and neck supple.     Comments: He is having moderate back pain. This made worse with bending and twisting at the waist.   Lymphadenopathy:     Cervical: No cervical adenopathy.  Skin:    General: Skin is warm and dry.  Neurological:     Mental Status: He is alert and oriented to person, place, and time.     Cranial Nerves: No cranial nerve deficit.  Psychiatric:        Behavior: Behavior normal.        Thought  Content: Thought content normal.        Judgment: Judgment normal.    Assessment/Plan: 1. Essential (primary) hypertension Improved. No changes to medication today. Patient should continue to monitor closely at home.   2. Neck pain, acute Improving. May continue to take pain medications as needed and as prescribed   3. Primary generalized (osteo)arthritis Two thirty day prewscriptions for hydrocodone/APAP 7.5/325mg  tablets sent to his pharmacy. Dates for prescriptions are 07/27/2019 and 08/24/2019 - HYDROcodone-acetaminophen (NORCO) 7.5-325 MG tablet; Take 1 tablet by mouth 2 (two) times daily as needed for moderate pain.  Dispense: 60 tablet; Refill: 0  General Counseling: Russell Sullivan verbalizes understanding of the findings of todays visit and agrees with plan of treatment. I have discussed any further diagnostic evaluation that may be needed or ordered today. We also reviewed his medications today. he has been encouraged to call the office with any questions or concerns that should arise related to todays visit.  Reviewed risks and possible side effects associated with taking opiates, benzodiazepines and other CNS depressants. Combination of these could cause dizziness and drowsiness. Advised patient not to drive or operate machinery when taking these medications, as patient's and other's life can be at risk and will have consequences. Patient verbalized understanding in this matter. Dependence and abuse for these drugs will be monitored closely. A Controlled substance policy and procedure is on file which allows [facility] to order a urine drug screen test at any visit. Patient understands and agrees with the plan  This patient was seen by Leretha Pol FNP [facility] with Dr Lavera Guise as a part of collaborative care agreement  Meds ordered this encounter  Medications  . DISCONTD: HYDROcodone-acetaminophen (NORCO) 7.5-325 MG tablet    Sig: Take 1 tablet by mouth 2 (two) times  daily as needed for moderate pain.    Dispense:  60 tablet    Refill:  0    Order Specific Question:   Supervising Provider    Answer:   Lavera Guise [id number]  .  HYDROcodone-acetaminophen (NORCO) 7.5-325 MG tablet    Sig: Take 1 tablet by mouth 2 (two) times daily as needed for moderate pain.    Dispense:  60 tablet    Refill:  0    Ok to fill after 08/25/2019    Order Specific Question:   Supervising Provider    Answer:   Lavera Guise [id number]    Total time spent: 30 Minutes   Time spent includes review of chart, medications, test results, and follow up plan with the patient.      Dr Lavera Guise Internal medicine

## 2019-08-24 ENCOUNTER — Other Ambulatory Visit: Payer: Self-pay

## 2019-08-24 DIAGNOSIS — M150 Primary generalized (osteo)arthritis: Secondary | ICD-10-CM

## 2019-08-24 MED ORDER — TRAMADOL HCL 50 MG PO TABS: 50.0000 mg | ORAL_TABLET | Freq: Two times a day (BID) | ORAL | 2 refills | Status: DC | PRN

## 2019-08-24 MED ORDER — TRAMADOL HCL 50 MG PO TABS: 50.0000 mg | ORAL_TABLET | Freq: Two times a day (BID) | ORAL | 3 refills | Status: DC | PRN

## 2019-09-04 NOTE — Telephone Encounter (Signed)
done

## 2019-09-24 ENCOUNTER — Telehealth: Payer: Self-pay

## 2019-09-24 ENCOUNTER — Other Ambulatory Visit: Payer: Self-pay

## 2019-09-24 DIAGNOSIS — I10 Essential (primary) hypertension: Secondary | ICD-10-CM

## 2019-09-24 MED ORDER — AMLODIPINE BESYLATE 2.5 MG PO TABS: 2.5000 mg | ORAL_TABLET | Freq: Every day | ORAL | 3 refills | Status: DC

## 2019-09-24 MED ORDER — ROSUVASTATIN CALCIUM 20 MG PO TABS: 20.0000 mg | ORAL_TABLET | Freq: Every day | ORAL | 3 refills | Status: DC

## 2019-09-24 NOTE — Telephone Encounter (Signed)
Confirmed and screened for 09-26-19 ov.

## 2019-09-26 ENCOUNTER — Ambulatory Visit (INDEPENDENT_AMBULATORY_CARE_PROVIDER_SITE_OTHER): Payer: Medicare HMO | Admitting: Nurse Practitioner

## 2019-09-26 ENCOUNTER — Encounter: Payer: Self-pay | Admitting: Nurse Practitioner

## 2019-09-26 ENCOUNTER — Other Ambulatory Visit: Payer: Self-pay

## 2019-09-26 VITALS — BP 134/68 | HR 72 | Temp 98.1°F | Resp 16 | Ht 70.0 in | Wt 224.6 lb

## 2019-09-26 DIAGNOSIS — K219 Gastro-esophageal reflux disease without esophagitis: Secondary | ICD-10-CM

## 2019-09-26 DIAGNOSIS — I10 Essential (primary) hypertension: Secondary | ICD-10-CM

## 2019-09-26 DIAGNOSIS — M150 Primary generalized (osteo)arthritis: Secondary | ICD-10-CM | POA: Diagnosis not present

## 2019-09-26 MED ORDER — HYDROCODONE-ACETAMINOPHEN 7.5-325 MG PO TABS: 1.0000 | ORAL_TABLET | Freq: Two times a day (BID) | ORAL | 0 refills | Status: DC | PRN

## 2019-09-26 NOTE — Progress Notes (Signed)
[facility] [place], [place] [address]  Internal MEDICINE  Office Visit Note  Patient Name: Russell Sullivan  [id number]  [id number]  Date of Service: 10/07/2019  Chief Complaint  Patient presents with  . Follow-up  . Hypertension  . Hyperlipidemia  . Has a hard time breathing while sleeping; helps to sit up    The patient is here for follow up. He states that he has been waking up from sleep with moderately dry mouth. Feels like this started after he was starting on medication to help improve BPH. After he gets up and has drink of water he is able to settle back down and go back to sleep.  He denies chest pain, chest pressure, or shortness of breath. He denies snoring, or choking when he sleeps and wakes up during the night. He states that he is trying to walk and exercise a little more often.  He continues to have pain in the lower back and his neck. This is most severe with exertion. He is slowly returning to his normal activities.   He does continues to take hydrocodone/APAP 7.5/325mg  twice daily when needed. This allows him to remain active in his activities of daily living without negative side effects. He does need a new prescription for this today.         Current Medication: Outpatient Encounter Medications as of 09/26/2019  Medication Sig  . albuterol (PROVENTIL HFA;VENTOLIN HFA) 108 (90 Base) MCG/ACT inhaler Inhale 2 puffs into the lungs every 6 (six) hours as needed for wheezing or shortness of breath.  Marland Kitchen amLODipine (NORVASC) 2.5 MG tablet Take 1 tablet (2.5 mg total) by mouth daily.  Marland Kitchen aspirin EC 81 MG tablet Take 1 tablet by mouth daily.  . flunisolide (NASALIDE) 25 MCG/ACT (0.025%) SOLN Place 2 sprays into the nose 2 (two) times daily.  Marland Kitchen HYDROcodone-acetaminophen (NORCO) 7.5-325 MG tablet Take 1 tablet by mouth 2 (two) times daily as needed for moderate pain.  Marland Kitchen levocetirizine (XYZAL) 5 MG tablet Take 1 tablet (5 mg total) by mouth every  evening.  . meloxicam (MOBIC) 15 MG tablet Take 1 tablet (15 mg total) by mouth daily.  . mometasone (NASONEX) 50 MCG/ACT nasal spray Place 2 sprays into the nose daily.  . rosuvastatin (CRESTOR) 20 MG tablet Take 1 tablet (20 mg total) by mouth at bedtime.  . sildenafil (VIAGRA) 100 MG tablet Take by mouth.  . solifenacin (VESICARE) 5 MG tablet Take 5 mg by mouth daily.  . tamsulosin (FLOMAX) 0.4 MG CAPS capsule Take 1 capsule (0.4 mg total) by mouth daily.  . traMADol (ULTRAM) 50 MG tablet Take 1 tablet (50 mg total) by mouth 2 (two) times daily as needed.  . [DISCONTINUED] HYDROcodone-acetaminophen (NORCO) 7.5-325 MG tablet Take 1 tablet by mouth 2 (two) times daily as needed for moderate pain.  . [DISCONTINUED] HYDROcodone-acetaminophen (NORCO) 7.5-325 MG tablet Take 1 tablet by mouth 2 (two) times daily as needed for moderate pain.  . famotidine (PEPCID) 20 MG tablet Take 1 tablet (20 mg total) by mouth 2 (two) times daily for 7 days.   No facility-administered encounter medications on file as of 09/26/2019.    Surgical History: Past Surgical History:  Procedure Laterality Date  . HERNIA REPAIR  2009  . INTUBATION-ENDOTRACHEAL WITH TRACHEOSTOMY STANDBY N/A 01/13/2018   Procedure: INTUBATION-ENDOTRACHEAL WITH TRACHEOSTOMY STANDBY;  Surgeon: Beverly Gust, MD;  Location: [facility] ORS;  Service: ENT;  Laterality: N/A;  . left shoulder surgery  Medical History: Past Medical History:  Diagnosis Date  . Hyperlipidemia   . Hypertension     Family History: Family History  Problem Relation Age of Onset  . Hypertension Mother   . Diabetes Mother   . Hypertension Father   . Diabetes Maternal Grandmother   . Diabetes Paternal Grandmother     Social History   Socioeconomic History  . Marital status: Married    Spouse name: Not on file  . Number of children: Not on file  . Years of education: Not on file  . Highest education level: Not on file  Occupational History  . Not on  file  Tobacco Use  . Smoking status: Former Smoker    Types: Cigarettes  . Smokeless tobacco: Never Used  Substance and Sexual Activity  . Alcohol use: Yes    Comment: ocassionally  . Drug use: No  . Sexual activity: Not on file  Other Topics Concern  . Not on file  Social History Narrative  . Not on file   Social Determinants of Health   Financial Resource Strain:   . Difficulty of Paying Living Expenses:   Food Insecurity:   . Worried About Charity fundraiser in the Last Year:   . Arboriculturist in the Last Year:   Transportation Needs:   . Film/video editor (Medical):   Marland Kitchen Lack of Transportation (Non-Medical):   Physical Activity:   . Days of Exercise per Week:   . Minutes of Exercise per Session:   Stress:   . Feeling of Stress :   Social Connections:   . Frequency of Communication with Friends and Family:   . Frequency of Social Gatherings with Friends and Family:   . Attends Religious Services:   . Active Member of Clubs or Organizations:   . Attends Archivist Meetings:   Marland Kitchen Marital Status:   Intimate Partner Violence:   . Fear of Current or Ex-Partner:   . Emotionally Abused:   Marland Kitchen Physically Abused:   . Sexually Abused:       Review of Systems  Constitutional: Negative for activity change, chills, fatigue and unexpected weight change.  HENT: Negative for congestion, postnasal drip, rhinorrhea, sneezing and sore throat.   Respiratory: Negative for cough, chest tightness, shortness of breath and wheezing.   Cardiovascular: Negative for chest pain and palpitations.       Improved blood pressure.  Gastrointestinal: Negative for abdominal pain, constipation, diarrhea, nausea and vomiting.  Endocrine: Negative for cold intolerance, heat intolerance, polydipsia and polyuria.  Musculoskeletal: Positive for back pain, myalgias, neck pain and neck stiffness. Negative for arthralgias and joint swelling.       Neck pain improving. Continues to have  moderate to severe pain in lower back, especially with exertion.  States that his neck still pops with movement, especially turning his head from left to right .  Skin: Negative for rash.  Allergic/Immunologic: Negative for environmental allergies.  Neurological: Positive for headaches. Negative for dizziness, tremors and numbness.  Hematological: Negative for adenopathy. Does not bruise/bleed easily.  Psychiatric/Behavioral: Negative for behavioral problems (Depression), sleep disturbance and suicidal ideas. The patient is nervous/anxious.     Today's Vitals   09/26/19 0845  BP: 134/68  Pulse: 72  Resp: 16  Temp: 98.1 F (36.7 C)  SpO2: 98%  Weight: 224 lb 9.6 oz (101.9 kg)  Height: 5\' 10"  (1.778 m)   Body mass index is 32.23 kg/m.  Physical Exam Vitals and nursing  note reviewed.  Constitutional:      Appearance: Normal appearance. He is well-developed. He is not diaphoretic.  HENT:     Head: Normocephalic and atraumatic.     Nose: Nose normal.     Mouth/Throat:     Pharynx: No oropharyngeal exudate.  Eyes:     Pupils: Pupils are equal, round, and reactive to light.  Neck:     Thyroid: No thyromegaly.     Vascular: No JVD.     Trachea: No tracheal deviation.     Comments: Improved ability to turn head from side to side. Still having popping and crunching in neck with movement.  Cardiovascular:     Rate and Rhythm: Normal rate and regular rhythm.     Pulses: Normal pulses.     Heart sounds: Normal heart sounds. No murmur heard.  No friction rub. No gallop.   Pulmonary:     Effort: Pulmonary effort is normal. No respiratory distress.     Breath sounds: Normal breath sounds. No wheezing or rales.  Chest:     Chest wall: No tenderness.  Abdominal:     Palpations: Abdomen is soft.  Musculoskeletal:        General: Normal range of motion.     Cervical back: Normal range of motion and neck supple.     Comments: He is having moderate back pain. This made worse with  bending and twisting at the waist.   Lymphadenopathy:     Cervical: No cervical adenopathy.  Skin:    General: Skin is warm and dry.  Neurological:     Mental Status: He is alert and oriented to person, place, and time.     Cranial Nerves: No cranial nerve deficit.  Psychiatric:        Behavior: Behavior normal.        Thought Content: Thought content normal.        Judgment: Judgment normal.    Assessment/Plan: 1. Essential (primary) hypertension Blood pressure stable. Continue bp medication as prescribed.   2. Primary generalized (osteo)arthritis May continue to take hydrocodone/APAP 7.5/325mg  tablets up to twice daily as needed for pain. Two thirty day prescriptions were sent to his pharmacy. Dates are 09/26/2019 and 10/24/2019 - HYDROcodone-acetaminophen (NORCO) 7.5-325 MG tablet; Take 1 tablet by mouth 2 (two) times daily as needed for moderate pain.  Dispense: 60 tablet; Refill: 0  3. Gastro-esophageal reflux disease without esophagitis Use pepcid as needed and as prescribed.   General Counseling: Russell Sullivan verbalizes understanding of the findings of todays visit and agrees with plan of treatment. I have discussed any further diagnostic evaluation that may be needed or ordered today. We also reviewed his medications today. he has been encouraged to call the office with any questions or concerns that should arise related to todays visit.    This patient was seen by [place] with Dr Lavera Guise as a part of collaborative care agreement  Meds ordered this encounter  Medications  . DISCONTD: HYDROcodone-acetaminophen (NORCO) 7.5-325 MG tablet    Sig: Take 1 tablet by mouth 2 (two) times daily as needed for moderate pain.    Dispense:  60 tablet    Refill:  0    Order Specific Question:   Supervising Provider    Answer:   Lavera Guise [[id number]]  . HYDROcodone-acetaminophen (NORCO) 7.5-325 MG tablet    Sig: Take 1 tablet by mouth 2 (two) times daily as needed  for moderate pain.  Dispense:  60 tablet    Refill:  0    Fill after 10/24/2019    Order Specific Question:   Supervising Provider    Answer:   Lavera Guise [[id number]]    Total time spent: 20 Minutes  Time spent includes review of chart, medications, test results, and follow up plan with the patient.      Dr Lavera Guise Internal medicine

## 2019-10-10 ENCOUNTER — Other Ambulatory Visit: Payer: Self-pay

## 2019-10-10 DIAGNOSIS — J309 Allergic rhinitis, unspecified: Secondary | ICD-10-CM

## 2019-10-10 MED ORDER — LEVOCETIRIZINE DIHYDROCHLORIDE 5 MG PO TABS: 5.0000 mg | ORAL_TABLET | Freq: Every evening | ORAL | 3 refills | Status: DC

## 2019-10-19 DIAGNOSIS — C61 Malignant neoplasm of prostate: Secondary | ICD-10-CM | POA: Diagnosis not present

## 2019-10-25 ENCOUNTER — Other Ambulatory Visit: Payer: Self-pay

## 2019-10-25 DIAGNOSIS — J309 Allergic rhinitis, unspecified: Secondary | ICD-10-CM

## 2019-10-25 MED ORDER — LEVOCETIRIZINE DIHYDROCHLORIDE 5 MG PO TABS: 5.0000 mg | ORAL_TABLET | Freq: Every evening | ORAL | 3 refills | Status: DC

## 2019-11-08 ENCOUNTER — Other Ambulatory Visit: Payer: Self-pay

## 2019-11-08 DIAGNOSIS — M150 Primary generalized (osteo)arthritis: Secondary | ICD-10-CM

## 2019-11-08 MED ORDER — MELOXICAM 15 MG PO TABS: 15.0000 mg | ORAL_TABLET | Freq: Every day | ORAL | 3 refills | Status: DC

## 2019-11-20 ENCOUNTER — Telehealth: Payer: Self-pay

## 2019-11-20 NOTE — Telephone Encounter (Signed)
Confirmed and screened for 11-22-19 ov. 

## 2019-11-22 ENCOUNTER — Other Ambulatory Visit: Payer: Self-pay

## 2019-11-22 ENCOUNTER — Ambulatory Visit (INDEPENDENT_AMBULATORY_CARE_PROVIDER_SITE_OTHER): Payer: Medicare HMO | Admitting: Nurse Practitioner

## 2019-11-22 ENCOUNTER — Encounter: Payer: Self-pay | Admitting: Nurse Practitioner

## 2019-11-22 VITALS — BP 148/78 | HR 85 | Temp 98.1°F | Resp 16 | Ht 70.0 in | Wt 219.8 lb

## 2019-11-22 DIAGNOSIS — M150 Primary generalized (osteo)arthritis: Secondary | ICD-10-CM

## 2019-11-22 DIAGNOSIS — I10 Essential (primary) hypertension: Secondary | ICD-10-CM | POA: Diagnosis not present

## 2019-11-22 DIAGNOSIS — E782 Mixed hyperlipidemia: Secondary | ICD-10-CM

## 2019-11-22 DIAGNOSIS — Z79899 Other long term (current) drug therapy: Secondary | ICD-10-CM | POA: Diagnosis not present

## 2019-11-22 DIAGNOSIS — R5383 Other fatigue: Secondary | ICD-10-CM

## 2019-11-22 DIAGNOSIS — K219 Gastro-esophageal reflux disease without esophagitis: Secondary | ICD-10-CM | POA: Diagnosis not present

## 2019-11-22 DIAGNOSIS — C61 Malignant neoplasm of prostate: Secondary | ICD-10-CM | POA: Diagnosis not present

## 2019-11-22 LAB — POCT URINE DRUG SCREEN
POC Amphetamine UR: NOT DETECTED
POC BENZODIAZEPINES UR: NOT DETECTED
POC Barbiturate UR: NOT DETECTED
POC Cocaine UR: NOT DETECTED
POC Ecstasy UR: NOT DETECTED
POC Marijuana UR: NOT DETECTED
POC Methadone UR: NOT DETECTED
POC Methamphetamine UR: NOT DETECTED
POC Opiate Ur: POSITIVE — AB
POC Oxycodone UR: NOT DETECTED
POC PHENCYCLIDINE UR: NOT DETECTED
POC TRICYCLICS UR: NOT DETECTED

## 2019-11-22 MED ORDER — HYDROCODONE-ACETAMINOPHEN 7.5-325 MG PO TABS: 1.0000 | ORAL_TABLET | Freq: Two times a day (BID) | ORAL | 0 refills | Status: DC | PRN

## 2019-11-22 MED ORDER — PANTOPRAZOLE SODIUM 40 MG PO TBEC: 40.0000 mg | DELAYED_RELEASE_TABLET | Freq: Every day | ORAL | 3 refills | Status: DC

## 2019-11-22 NOTE — Progress Notes (Signed)
[facility] [place], [place] [address]  Internal MEDICINE  Office Visit Note  Patient Name: Russell Sullivan  [id number]  [id number]  Date of Service: 12/08/2019  Chief Complaint  Patient presents with  . Follow-up    prostate cancer, issue with sleeping/breathing through mouth when sleeping, fatigue  . Hyperlipidemia  . Hypertension  . Quality Metric Gaps    colorgaurd    The patient is here for routine follow up. He states that he has recently been diagnosed with prostate cancer. He does see urology for this. States that he is diagnosed with "low-grade" cancer. This is monitored closely but does not require treatment at this time. He states that he is waking up in the middle of the night with very acidic taste in his mouth and coating in his tongue. Feels as though he might be having acid reflux. He currently takes pepcid as needed but does not feel as though this is working well.  He is scheduled for health maintenance exam in October. He should have routine, fasting labs prior to this visit.  He continues to have pain in the lower back and his neck. This is most severe with exertion. He is slowly returning to his normal activities.   He does continues to take hydrocodone/APAP 7.5/325mg  twice daily when needed. This allows him to remain active in his activities of daily living without negative side effects. He does need a new prescription for this today.         Current Medication: Outpatient Encounter Medications as of 11/22/2019  Medication Sig  . albuterol (PROVENTIL HFA;VENTOLIN HFA) 108 (90 Base) MCG/ACT inhaler Inhale 2 puffs into the lungs every 6 (six) hours as needed for wheezing or shortness of breath.  Marland Kitchen amLODipine (NORVASC) 2.5 MG tablet Take 1 tablet (2.5 mg total) by mouth daily.  Marland Kitchen aspirin EC 81 MG tablet Take 1 tablet by mouth daily.  . flunisolide (NASALIDE) 25 MCG/ACT (0.025%) SOLN Place 2 sprays into the nose 2 (two) times daily.  Marland Kitchen  HYDROcodone-acetaminophen (NORCO) 7.5-325 MG tablet Take 1 tablet by mouth 2 (two) times daily as needed for moderate pain.  Marland Kitchen levocetirizine (XYZAL) 5 MG tablet Take 1 tablet (5 mg total) by mouth every evening.  . meloxicam (MOBIC) 15 MG tablet Take 1 tablet (15 mg total) by mouth daily.  . mometasone (NASONEX) 50 MCG/ACT nasal spray Place 2 sprays into the nose daily.  . rosuvastatin (CRESTOR) 20 MG tablet Take 1 tablet (20 mg total) by mouth at bedtime.  . solifenacin (VESICARE) 5 MG tablet Take 5 mg by mouth daily.  . tamsulosin (FLOMAX) 0.4 MG CAPS capsule Take 1 capsule (0.4 mg total) by mouth daily.  . [DISCONTINUED] HYDROcodone-acetaminophen (NORCO) 7.5-325 MG tablet Take 1 tablet by mouth 2 (two) times daily as needed for moderate pain.  . [DISCONTINUED] HYDROcodone-acetaminophen (NORCO) 7.5-325 MG tablet Take 1 tablet by mouth 2 (two) times daily as needed for moderate pain.  . [DISCONTINUED] sildenafil (VIAGRA) 100 MG tablet Take by mouth.  . [DISCONTINUED] traMADol (ULTRAM) 50 MG tablet Take 1 tablet (50 mg total) by mouth 2 (two) times daily as needed.  . famotidine (PEPCID) 20 MG tablet Take 1 tablet (20 mg total) by mouth 2 (two) times daily for 7 days.  . pantoprazole (PROTONIX) 40 MG tablet Take 1 tablet (40 mg total) by mouth daily.   No facility-administered encounter medications on file as of 11/22/2019.    Surgical History: Past Surgical History:  Procedure Laterality Date  . HERNIA REPAIR  2009  . INTUBATION-ENDOTRACHEAL WITH TRACHEOSTOMY STANDBY N/A 01/13/2018   Procedure: INTUBATION-ENDOTRACHEAL WITH TRACHEOSTOMY STANDBY;  Surgeon: Beverly Gust, MD;  Location: [facility] ORS;  Service: ENT;  Laterality: N/A;  . left shoulder surgery      Medical History: Past Medical History:  Diagnosis Date  . Adenocarcinoma of prostate ([place])   . Hyperlipidemia   . Hypertension     Family History: Family History  Problem Relation Age of Onset  . Hypertension Mother   .  Diabetes Mother   . Hypertension Father   . Diabetes Maternal Grandmother   . Diabetes Paternal Grandmother     Social History   Socioeconomic History  . Marital status: Married    Spouse name: Not on file  . Number of children: Not on file  . Years of education: Not on file  . Highest education level: Not on file  Occupational History  . Not on file  Tobacco Use  . Smoking status: Former Smoker    Types: Cigarettes  . Smokeless tobacco: Never Used  Substance and Sexual Activity  . Alcohol use: Yes    Comment: ocassionally  . Drug use: No  . Sexual activity: Not on file  Other Topics Concern  . Not on file  Social History Narrative  . Not on file   Social Determinants of Health   Financial Resource Strain:   . Difficulty of Paying Living Expenses: Not on file  Food Insecurity:   . Worried About Charity fundraiser in the Last Year: Not on file  . Ran Out of Food in the Last Year: Not on file  Transportation Needs:   . Lack of Transportation (Medical): Not on file  . Lack of Transportation (Non-Medical): Not on file  Physical Activity:   . Days of Exercise per Week: Not on file  . Minutes of Exercise per Session: Not on file  Stress:   . Feeling of Stress : Not on file  Social Connections:   . Frequency of Communication with Friends and Family: Not on file  . Frequency of Social Gatherings with Friends and Family: Not on file  . Attends Religious Services: Not on file  . Active Member of Clubs or Organizations: Not on file  . Attends Archivist Meetings: Not on file  . Marital Status: Not on file  Intimate Partner Violence:   . Fear of Current or Ex-Partner: Not on file  . Emotionally Abused: Not on file  . Physically Abused: Not on file  . Sexually Abused: Not on file      Review of Systems  Constitutional: Negative for activity change, chills, fatigue and unexpected weight change.  HENT: Negative for congestion, postnasal drip, rhinorrhea,  sneezing and sore throat.   Respiratory: Negative for cough, chest tightness, shortness of breath and wheezing.   Cardiovascular: Negative for chest pain and palpitations.       Improved blood pressure.  Gastrointestinal: Negative for abdominal pain, constipation, diarrhea, nausea and vomiting.       Acid reflux type symptoms.   Endocrine: Negative for cold intolerance, heat intolerance, polydipsia and polyuria.  Genitourinary:       Recent diagnosis of prostate cancer.   Musculoskeletal: Positive for back pain, myalgias, neck pain and neck stiffness. Negative for arthralgias and joint swelling.       Continues to have moderate to severe pain in lower back, especially with exertion.  States that his neck still pops  with movement, especially turning his head from left to right .  Skin: Negative for rash.  Allergic/Immunologic: Negative for environmental allergies.  Neurological: Negative for dizziness, tremors, numbness and headaches.  Hematological: Negative for adenopathy. Does not bruise/bleed easily.  Psychiatric/Behavioral: Negative for behavioral problems (Depression), sleep disturbance and suicidal ideas. The patient is nervous/anxious.     Today's Vitals   11/22/19 0900  BP: (!) 148/78  Pulse: 85  Resp: 16  Temp: 98.1 F (36.7 C)  SpO2: 98%  Weight: 219 lb 12.8 oz (99.7 kg)  Height: 5\' 10"  (1.778 m)   Body mass index is 31.54 kg/m.  Physical Exam Vitals and nursing note reviewed.  Constitutional:      Appearance: Normal appearance. He is well-developed. He is not diaphoretic.  HENT:     Head: Normocephalic and atraumatic.     Nose: Nose normal.     Mouth/Throat:     Pharynx: No oropharyngeal exudate.  Eyes:     Pupils: Pupils are equal, round, and reactive to light.  Neck:     Thyroid: No thyromegaly.     Vascular: No JVD.     Trachea: No tracheal deviation.     Comments: Improved ability to turn head from side to side. Still having popping and crunching in neck  with movement.  Cardiovascular:     Rate and Rhythm: Normal rate and regular rhythm.     Pulses: Normal pulses.     Heart sounds: Normal heart sounds. No murmur heard.  No friction rub. No gallop.   Pulmonary:     Effort: Pulmonary effort is normal. No respiratory distress.     Breath sounds: Normal breath sounds. No wheezing or rales.  Chest:     Chest wall: No tenderness.  Abdominal:     Palpations: Abdomen is soft.  Musculoskeletal:        General: Normal range of motion.     Cervical back: Normal range of motion and neck supple.     Comments: He is having moderate back pain. This made worse with bending and twisting at the waist.   Lymphadenopathy:     Cervical: No cervical adenopathy.  Skin:    General: Skin is warm and dry.  Neurological:     Mental Status: He is alert and oriented to person, place, and time.     Cranial Nerves: No cranial nerve deficit.  Psychiatric:        Behavior: Behavior normal.        Thought Content: Thought content normal.        Judgment: Judgment normal.   Assessment/Plan: 1. Essential (primary) hypertension Blood pressure stable. Continue medication as prescribed   2. Mixed hyperlipidemia Check fasting lipid panel and adust dosing of rosuvastatin as indicated.   3. Gastro-esophageal reflux disease without esophagitis Add pantoprazole 40mg  daily. May take pepcid as needed and as prescribed  - pantoprazole (PROTONIX) 40 MG tablet; Take 1 tablet (40 mg total) by mouth daily.  Dispense: 30 tablet; Refill: 3  4. Other fatigue Check labs for further evaluation.   5. Primary generalized (osteo)arthritis May continue to take hydrocodone/APAP 7.5/325mg  tablets up to twice daily as needed for pain. Two thirty day prescriptions were sent to his pharmacy. Dates are 11/22/2019 and 12/21/2019 - HYDROcodone-acetaminophen (NORCO) 7.5-325 MG tablet; Take 1 tablet by mouth 2 (two) times daily as needed for moderate pain.  Dispense: 60 tablet; Refill:  0  6. Encounter for long-term (current) use of medications - POCT Urine Drug  Screen appropriately positive for opiates.   7. Malignant neoplasm of prostate ([place]) Continue regular visits with urology/oncology as scheduled.   General Counseling: Brownie verbalizes understanding of the findings of todays visit and agrees with plan of treatment. I have discussed any further diagnostic evaluation that may be needed or ordered today. We also reviewed his medications today. he has been encouraged to call the office with any questions or concerns that should arise related to todays visit.  Reviewed risks and possible side effects associated with taking opiates, benzodiazepines and other CNS depressants. Combination of these could cause dizziness and drowsiness. Advised patient not to drive or operate machinery when taking these medications, as patient's and other's life can be at risk and will have consequences. Patient verbalized understanding in this matter. Dependence and abuse for these drugs will be monitored closely. A Controlled substance policy and procedure is on file which allows [facility] to order a urine drug screen test at any visit. Patient understands and agrees with the plan  This patient was seen by Leretha Pol FNP Collaboration with Dr Lavera Guise as a part of collaborative care agreement  Orders Placed This Encounter  Procedures  . POCT Urine Drug Screen    Meds ordered this encounter  Medications  . pantoprazole (PROTONIX) 40 MG tablet    Sig: Take 1 tablet (40 mg total) by mouth daily.    Dispense:  30 tablet    Refill:  3    Order Specific Question:   Supervising Provider    Answer:   Lavera Guise [[id number]]  . DISCONTD: HYDROcodone-acetaminophen (NORCO) 7.5-325 MG tablet    Sig: Take 1 tablet by mouth 2 (two) times daily as needed for moderate pain.    Dispense:  60 tablet    Refill:  0    Order Specific Question:   Supervising Provider    Answer:   Lavera Guise [[id number]]  . HYDROcodone-acetaminophen (NORCO) 7.5-325 MG tablet    Sig: Take 1 tablet by mouth 2 (two) times daily as needed for moderate pain.    Dispense:  60 tablet    Refill:  0    Fill after 12/21/2019    Order Specific Question:   Supervising Provider    Answer:   Lavera Guise [[id number]]    Total time spent: 35 Minutes   Time spent includes review of chart, medications, test results, and follow up plan with the patient.      Dr Lavera Guise Internal medicine

## 2019-11-23 ENCOUNTER — Other Ambulatory Visit: Payer: Self-pay

## 2019-11-23 ENCOUNTER — Telehealth: Payer: Self-pay

## 2019-11-23 DIAGNOSIS — M150 Primary generalized (osteo)arthritis: Secondary | ICD-10-CM

## 2019-11-23 NOTE — Telephone Encounter (Signed)
Faxed Cologuard paperwork for patient.

## 2019-11-28 ENCOUNTER — Other Ambulatory Visit: Payer: Self-pay | Admitting: Nurse Practitioner

## 2019-11-28 ENCOUNTER — Telehealth: Payer: Self-pay

## 2019-11-28 DIAGNOSIS — M150 Primary generalized (osteo)arthritis: Secondary | ICD-10-CM

## 2019-11-28 MED ORDER — TRAMADOL HCL 50 MG PO TABS: 50.0000 mg | ORAL_TABLET | Freq: Two times a day (BID) | ORAL | 2 refills | Status: DC | PRN

## 2019-11-28 NOTE — Telephone Encounter (Signed)
I did not know he needed it. I jjust sent it along with 2 refills to [facility].

## 2019-12-03 DIAGNOSIS — Z1212 Encounter for screening for malignant neoplasm of rectum: Secondary | ICD-10-CM | POA: Diagnosis not present

## 2019-12-03 DIAGNOSIS — Z1211 Encounter for screening for malignant neoplasm of colon: Secondary | ICD-10-CM | POA: Diagnosis not present

## 2019-12-08 DIAGNOSIS — C61 Malignant neoplasm of prostate: Secondary | ICD-10-CM | POA: Insufficient documentation

## 2019-12-08 LAB — COLOGUARD: COLOGUARD: NEGATIVE

## 2019-12-31 DIAGNOSIS — R5383 Other fatigue: Secondary | ICD-10-CM | POA: Diagnosis not present

## 2019-12-31 DIAGNOSIS — I10 Essential (primary) hypertension: Secondary | ICD-10-CM | POA: Diagnosis not present

## 2019-12-31 DIAGNOSIS — R972 Elevated prostate specific antigen [PSA]: Secondary | ICD-10-CM | POA: Diagnosis not present

## 2019-12-31 DIAGNOSIS — Z0001 Encounter for general adult medical examination with abnormal findings: Secondary | ICD-10-CM | POA: Diagnosis not present

## 2020-01-18 ENCOUNTER — Other Ambulatory Visit: Payer: Self-pay | Admitting: Nurse Practitioner

## 2020-01-18 DIAGNOSIS — E782 Mixed hyperlipidemia: Secondary | ICD-10-CM | POA: Diagnosis not present

## 2020-01-18 DIAGNOSIS — D529 Folate deficiency anemia, unspecified: Secondary | ICD-10-CM | POA: Diagnosis not present

## 2020-01-18 DIAGNOSIS — Z0001 Encounter for general adult medical examination with abnormal findings: Secondary | ICD-10-CM | POA: Diagnosis not present

## 2020-01-18 DIAGNOSIS — D510 Vitamin B12 deficiency anemia due to intrinsic factor deficiency: Secondary | ICD-10-CM | POA: Diagnosis not present

## 2020-01-18 DIAGNOSIS — R5383 Other fatigue: Secondary | ICD-10-CM | POA: Diagnosis not present

## 2020-01-18 DIAGNOSIS — I10 Essential (primary) hypertension: Secondary | ICD-10-CM | POA: Diagnosis not present

## 2020-01-19 LAB — COMPREHENSIVE METABOLIC PANEL
ALT: 22 IU/L (ref 0–44)
AST: 23 IU/L (ref 0–40)
Albumin/Globulin Ratio: 1.8 (ref 1.2–2.2)
Albumin: 4.7 g/dL (ref 3.8–4.8)
Alkaline Phosphatase: 117 IU/L (ref 44–121)
BUN/Creatinine Ratio: 10 (ref 10–24)
BUN: 11 mg/dL (ref 8–27)
Bilirubin Total: 0.6 mg/dL (ref 0.0–1.2)
CO2: 25 mmol/L (ref 20–29)
Calcium: 10.1 mg/dL (ref 8.6–10.2)
Chloride: 100 mmol/L (ref 96–106)
Creatinine, Ser: 1.06 mg/dL (ref 0.76–1.27)
GFR calc Af Amer: 82 mL/min/{1.73_m2} (ref >59)
GFR calc non Af Amer: 71 mL/min/{1.73_m2} (ref >59)
Globulin, Total: 2.6 g/dL (ref 1.5–4.5)
Glucose: 131 mg/dL — ABNORMAL HIGH (ref 65–99)
Potassium: 5.2 mmol/L (ref 3.5–5.2)
Sodium: 139 mmol/L (ref 134–144)
Total Protein: 7.3 g/dL (ref 6.0–8.5)

## 2020-01-19 LAB — CBC
Hematocrit: 46.2 % (ref 37.5–51.0)
Hemoglobin: 15.6 g/dL (ref 13.0–17.7)
MCH: 30.6 pg (ref 26.6–33.0)
MCHC: 33.8 g/dL (ref 31.5–35.7)
MCV: 91 fL (ref 79–97)
Platelets: 259 10*3/uL (ref 150–450)
RBC: 5.1 x10E6/uL (ref 4.14–5.80)
RDW: 12.5 % (ref 11.6–15.4)
WBC: 7.6 10*3/uL (ref 3.4–10.8)

## 2020-01-19 LAB — B12 AND FOLATE PANEL
Folate: 10.3 ng/mL (ref >3.0)
Vitamin B-12: 512 pg/mL (ref 232–1245)

## 2020-01-19 LAB — LIPID PANEL WITH LDL/HDL RATIO
Cholesterol, Total: 127 mg/dL (ref 100–199)
HDL: 44 mg/dL (ref >39)
LDL Chol Calc (NIH): 54 mg/dL (ref 0–99)
LDL/HDL Ratio: 1.2 ratio (ref 0.0–3.6)
Triglycerides: 176 mg/dL — ABNORMAL HIGH (ref 0–149)
VLDL Cholesterol Cal: 29 mg/dL (ref 5–40)

## 2020-01-19 LAB — FERRITIN: Ferritin: 153 ng/mL (ref 30–400)

## 2020-01-19 LAB — T4, FREE: Free T4: 1.09 ng/dL (ref 0.82–1.77)

## 2020-01-19 LAB — TSH: TSH: 1.7 u[IU]/mL (ref 0.450–4.500)

## 2020-01-24 ENCOUNTER — Ambulatory Visit
Admission: RE | Admit: 2020-01-24 | Discharge: 2020-01-24 | Disposition: A | Discharge status: Home / Self Care | Payer: Medicare HMO | Attending: Nurse Practitioner | Admitting: Nurse Practitioner

## 2020-01-24 ENCOUNTER — Other Ambulatory Visit: Payer: Self-pay

## 2020-01-24 ENCOUNTER — Ambulatory Visit (INDEPENDENT_AMBULATORY_CARE_PROVIDER_SITE_OTHER): Payer: Medicare HMO | Admitting: Nurse Practitioner

## 2020-01-24 ENCOUNTER — Ambulatory Visit
Admission: RE | Admit: 2020-01-24 | Discharge: 2020-01-24 | Disposition: A | Discharge status: Home / Self Care | Payer: Medicare HMO | Source: Ambulatory Visit | Attending: Nurse Practitioner | Admitting: Nurse Practitioner

## 2020-01-24 ENCOUNTER — Encounter: Payer: Self-pay | Admitting: Nurse Practitioner

## 2020-01-24 VITALS — BP 152/78 | HR 77 | Temp 98.0°F | Resp 16 | Ht 70.0 in | Wt 221.2 lb

## 2020-01-24 DIAGNOSIS — M5136 Other intervertebral disc degeneration, lumbar region: Secondary | ICD-10-CM | POA: Diagnosis not present

## 2020-01-24 DIAGNOSIS — M5030 Other cervical disc degeneration, unspecified cervical region: Secondary | ICD-10-CM | POA: Insufficient documentation

## 2020-01-24 DIAGNOSIS — Z0001 Encounter for general adult medical examination with abnormal findings: Secondary | ICD-10-CM

## 2020-01-24 DIAGNOSIS — M150 Primary generalized (osteo)arthritis: Secondary | ICD-10-CM

## 2020-01-24 DIAGNOSIS — I10 Essential (primary) hypertension: Secondary | ICD-10-CM

## 2020-01-24 DIAGNOSIS — M2578 Osteophyte, vertebrae: Secondary | ICD-10-CM | POA: Diagnosis not present

## 2020-01-24 DIAGNOSIS — R300 Dysuria: Secondary | ICD-10-CM | POA: Diagnosis not present

## 2020-01-24 DIAGNOSIS — J309 Allergic rhinitis, unspecified: Secondary | ICD-10-CM | POA: Diagnosis not present

## 2020-01-24 DIAGNOSIS — Z23 Encounter for immunization: Secondary | ICD-10-CM | POA: Diagnosis not present

## 2020-01-24 DIAGNOSIS — M545 Low back pain, unspecified: Secondary | ICD-10-CM | POA: Diagnosis not present

## 2020-01-24 DIAGNOSIS — R7301 Impaired fasting glucose: Secondary | ICD-10-CM

## 2020-01-24 DIAGNOSIS — M5032 Other cervical disc degeneration, mid-cervical region, unspecified level: Secondary | ICD-10-CM | POA: Diagnosis not present

## 2020-01-24 DIAGNOSIS — R0602 Shortness of breath: Secondary | ICD-10-CM

## 2020-01-24 LAB — POCT GLYCOSYLATED HEMOGLOBIN (HGB A1C): Hemoglobin A1C: 6.6 % — AB (ref 4.0–5.6)

## 2020-01-24 MED ORDER — HYDROCODONE-ACETAMINOPHEN 7.5-325 MG PO TABS: 1.0000 | ORAL_TABLET | Freq: Every day | ORAL | 0 refills | Status: DC | PRN

## 2020-01-24 MED ORDER — TRAMADOL HCL 50 MG PO TABS: 50.0000 mg | ORAL_TABLET | Freq: Every day | ORAL | 1 refills | Status: DC | PRN

## 2020-01-24 MED ORDER — ALBUTEROL SULFATE HFA 108 (90 BASE) MCG/ACT IN AERS: 2.0000 | INHALATION_SPRAY | Freq: Four times a day (QID) | RESPIRATORY_TRACT | 5 refills | Status: DC | PRN

## 2020-01-24 MED ORDER — FLUTICASONE PROPIONATE 50 MCG/ACT NA SUSP: 2.0000 | Freq: Every day | NASAL | 5 refills | Status: DC

## 2020-01-24 NOTE — Progress Notes (Signed)
[facility] [place], [place] [address]  Internal MEDICINE  Office Visit Note  Patient Name: Russell Sullivan  [id number]  [id number]  Date of Service: 02/13/2020   Pt is here for routine health maintenance examination   Chief Complaint  Patient presents with  . Medicare Wellness    trouble sleeping/breathing when laying down, pt has to sleep in a chair  . Hypertension  . Hyperlipidemia  . policy update form    received     The patient is here for health maintenance exam. He had routine, fasting labs done prior to this visit. His blood sugar was 133. Checked HgbA1c in the office with 6.6 today. His triglycerides were mildly elevated, however, lipid panel was otherwise normal. The patient states that he has been having trouble sleeping at night. He states that he cannot breathe through is nose. Makes it difficult for him to sleep. He has used albuterol inhaler and flonase nasal inhaler in the past. These have helped in the past.  Does have chronic pain in neck and back. Currently on pain medication routinely. Discussed new policy regarding controlled medications. About 20 years ago, he had a fall, injuring his lower back. In September, 2020, he had a another injury causing a closed fracture of cervical vertebrae. We should get new x-rays of the spine. These have been ordered. Will also get connective tissue panel. Reduced frequency of both hydrocodone and tramadol to once daily as needed. Single 30 day prescriptions of both were sent to his pharmacy. Will see him back in 3 weeks to discuss results and possible referral to orthopedics/pain management/rheumatology as indicated. The patient voiced understanding and agreement with this plan.  Would like to get flu shot while at his visit today.     Current Medication: Outpatient Encounter Medications as of 01/24/2020  Medication Sig  . albuterol (VENTOLIN HFA) 108 (90 Base) MCG/ACT inhaler Inhale 2 puffs into the  lungs every 6 (six) hours as needed for wheezing or shortness of breath.  Marland Kitchen amLODipine (NORVASC) 2.5 MG tablet Take 1 tablet (2.5 mg total) by mouth daily.  Marland Kitchen aspirin EC 81 MG tablet Take 1 tablet by mouth daily.  Marland Kitchen HYDROcodone-acetaminophen (NORCO) 7.5-325 MG tablet Take 1 tablet by mouth daily as needed for moderate pain.  Marland Kitchen levocetirizine (XYZAL) 5 MG tablet Take 1 tablet (5 mg total) by mouth every evening.  . meloxicam (MOBIC) 15 MG tablet Take 1 tablet (15 mg total) by mouth daily.  . pantoprazole (PROTONIX) 40 MG tablet Take 1 tablet (40 mg total) by mouth daily.  . rosuvastatin (CRESTOR) 20 MG tablet Take 1 tablet (20 mg total) by mouth at bedtime.  . solifenacin (VESICARE) 5 MG tablet Take 5 mg by mouth daily.  . tamsulosin (FLOMAX) 0.4 MG CAPS capsule Take 1 capsule (0.4 mg total) by mouth daily.  . traMADol (ULTRAM) 50 MG tablet Take 1 tablet (50 mg total) by mouth daily as needed.  . [DISCONTINUED] albuterol (PROVENTIL HFA;VENTOLIN HFA) 108 (90 Base) MCG/ACT inhaler Inhale 2 puffs into the lungs every 6 (six) hours as needed for wheezing or shortness of breath.  . [DISCONTINUED] flunisolide (NASALIDE) 25 MCG/ACT (0.025%) SOLN Place 2 sprays into the nose 2 (two) times daily.  . [DISCONTINUED] HYDROcodone-acetaminophen (NORCO) 7.5-325 MG tablet Take 1 tablet by mouth 2 (two) times daily as needed for moderate pain.  . [DISCONTINUED] mometasone (NASONEX) 50 MCG/ACT nasal spray Place 2 sprays into the nose daily.  . [DISCONTINUED] traMADol Veatrice Bourbon)  50 MG tablet Take 1 tablet (50 mg total) by mouth 2 (two) times daily as needed.  . famotidine (PEPCID) 20 MG tablet Take 1 tablet (20 mg total) by mouth 2 (two) times daily for 7 days.  . fluticasone (FLONASE) 50 MCG/ACT nasal spray Place 2 sprays into both nostrils daily.   No facility-administered encounter medications on file as of 01/24/2020.    Surgical History: Past Surgical History:  Procedure Laterality Date  . HERNIA REPAIR   2009  . INTUBATION-ENDOTRACHEAL WITH TRACHEOSTOMY STANDBY N/A 01/13/2018   Procedure: INTUBATION-ENDOTRACHEAL WITH TRACHEOSTOMY STANDBY;  Surgeon: Beverly Gust, MD;  Location: [facility] ORS;  Service: ENT;  Laterality: N/A;  . left shoulder surgery      Medical History: Past Medical History:  Diagnosis Date  . Adenocarcinoma of prostate ([place])   . Hyperlipidemia   . Hypertension     Family History: Family History  Problem Relation Age of Onset  . Hypertension Mother   . Diabetes Mother   . Hypertension Father   . Diabetes Maternal Grandmother   . Diabetes Paternal Grandmother       Review of Systems  Constitutional: Negative for activity change, chills, fatigue and unexpected weight change.  HENT: Negative for congestion, postnasal drip, rhinorrhea, sneezing and sore throat.   Respiratory: Negative for cough, chest tightness, shortness of breath and wheezing.   Cardiovascular: Negative for chest pain and palpitations.       Blood pressure elevated today.  Gastrointestinal: Negative for abdominal pain, constipation, diarrhea, nausea and vomiting.       Acid reflux type symptoms.   Endocrine: Negative for cold intolerance, heat intolerance, polydipsia and polyuria.  Genitourinary:       Recent diagnosis of prostate cancer.   Musculoskeletal: Positive for back pain, myalgias, neck pain and neck stiffness. Negative for arthralgias and joint swelling.       Continues to have moderate to severe pain in lower back, especially with exertion.  States that his neck still pops with movement, especially turning his head from left to right .  Skin: Negative for rash.  Allergic/Immunologic: Negative for environmental allergies.  Neurological: Negative for dizziness, tremors, numbness and headaches.  Hematological: Negative for adenopathy. Does not bruise/bleed easily.  Psychiatric/Behavioral: Negative for behavioral problems (Depression), sleep disturbance and suicidal ideas. The patient is  nervous/anxious.      Today's Vitals   01/24/20 1456  BP: (!) 152/78  Pulse: 77  Resp: 16  Temp: 98 F (36.7 C)  SpO2: 98%  Weight: 221 lb 3.2 oz (100.3 kg)  Height: 5' 10"  (1.778 m)   Body mass index is 31.74 kg/m.  Physical Exam Vitals and nursing note reviewed.  Constitutional:      Appearance: Normal appearance. He is well-developed. He is not diaphoretic.  HENT:     Head: Normocephalic and atraumatic.     Nose: Nose normal.     Mouth/Throat:     Pharynx: No oropharyngeal exudate.  Eyes:     Pupils: Pupils are equal, round, and reactive to light.  Neck:     Thyroid: No thyromegaly.     Vascular: No JVD.     Trachea: No tracheal deviation.     Comments: Improved ability to turn head from side to side. Still having popping and crunching in neck with movement. Cardiovascular:     Rate and Rhythm: Normal rate and regular rhythm.     Pulses: Normal pulses.     Heart sounds: Normal heart sounds. No murmur heard.  No friction rub. No gallop.   Pulmonary:     Effort: Pulmonary effort is normal. No respiratory distress.     Breath sounds: Normal breath sounds. No wheezing or rales.  Chest:     Chest wall: No tenderness.  Abdominal:     General: Bowel sounds are normal.     Palpations: Abdomen is soft.     Tenderness: There is no abdominal tenderness.  Musculoskeletal:        General: Normal range of motion.     Cervical back: Normal range of motion and neck supple.     Comments: He is having moderate back pain. This made worse with bending and twisting at the waist.  There are no palpable abnormalities or deformities noted at this time.  Lymphadenopathy:     Cervical: No cervical adenopathy.  Skin:    General: Skin is warm and dry.     Capillary Refill: Capillary refill takes less than 2 seconds.  Neurological:     General: No focal deficit present.     Mental Status: He is alert and oriented to person, place, and time.     Cranial Nerves: No cranial nerve  deficit.  Psychiatric:        Mood and Affect: Mood normal.        Behavior: Behavior normal.        Thought Content: Thought content normal.        Judgment: Judgment normal.    Depression screen [facility] 2/9 01/24/2020 11/22/2019 09/26/2019 06/26/2019 04/26/2019  Decreased Interest 0 0 0 0 0  Down, Depressed, Hopeless 0 0 0 0 0  PHQ - 2 Score 0 0 0 0 0    Functional Status Survey: Is the patient deaf or have difficulty hearing?: No Does the patient have difficulty seeing, even when wearing glasses/contacts?: No Does the patient have difficulty concentrating, remembering, or making decisions?: No Does the patient have difficulty walking or climbing stairs?: No Does the patient have difficulty dressing or bathing?: No Does the patient have difficulty doing errands alone such as visiting a doctor's office or shopping?: No  MMSE - [place] Exam 01/24/2020 12/09/2017  Orientation to time 5 5  Orientation to Place 5 5  Registration 3 3  Attention/ Calculation 5 5  Recall 3 3  Language- name 2 objects 2 2  Language- repeat - 1  Language- follow 3 step command - 3  Language- read & follow direction 1 1  Write a sentence 1 1  Copy design 1 1  Total score - 30    Fall Risk  01/24/2020 11/22/2019 09/26/2019 06/26/2019 04/26/2019  Falls in the past year? 0 0 0 0 1  Number falls in past yr: - - - - 0  Injury with Fall? - - - - 1  Risk for fall due to : No Fall Risks - - - -  Follow up Falls evaluation completed - - - -      LABS: Recent Results (from the past 2160 hour(s))  POCT Urine Drug Screen     Status: Abnormal   Collection Time: 11/22/19  9:14 AM  Result Value Ref Range   POC Methamphetamine UR None Detected None Detected   POC Opiate Ur Positive (A) None Detected   POC Barbiturate UR None Detected None Detected   POC Amphetamine UR None Detected None Detected   POC Oxycodone UR None Detected None Detected   POC Cocaine UR None Detected None Detected   POC  Ecstasy UR  None Detected None Detected   POC TRICYCLICS UR None Detected None Detected   POC PHENCYCLIDINE UR None Detected None Detected   POC Marijuana UR None Detected None Detected   POC Methadone UR None Detected None Detected   POC BENZODIAZEPINES UR None Detected None Detected   URINE TEMPERATURE     POC DRUG SCREEN OXIDANTS URINE     POC SPECIFIC GRAVITY URINE     POC PH URINE     Methylenedioxyamphetamine    Comprehensive metabolic panel     Status: Abnormal   Collection Time: 01/18/20 10:02 AM  Result Value Ref Range   Glucose 131 (H) 65 - 99 mg/dL   BUN 11 8 - 27 mg/dL   Creatinine, Ser 1.06 0.76 - 1.27 mg/dL   GFR calc non Af Amer 71 >59 mL/min/1.73   GFR calc Af Amer 82 >59 mL/min/1.73    Comment: **[facility] currently reports eGFR in compliance with the current**   recommendations of the [employer]. [facility] will   update reporting as new guidelines are published from the NKF-ASN   Task force.    BUN/Creatinine Ratio 10 10 - 24   Sodium 139 134 - 144 mmol/L   Potassium 5.2 3.5 - 5.2 mmol/L   Chloride 100 96 - 106 mmol/L   CO2 25 20 - 29 mmol/L   Calcium 10.1 8.6 - 10.2 mg/dL   Total Protein 7.3 6.0 - 8.5 g/dL   Albumin 4.7 3.8 - 4.8 g/dL   Globulin, Total 2.6 1.5 - 4.5 g/dL   Albumin/Globulin Ratio 1.8 1.2 - 2.2   Bilirubin Total 0.6 0.0 - 1.2 mg/dL   Alkaline Phosphatase 117 44 - 121 IU/L    Comment:               **Please note reference interval change**   AST 23 0 - 40 IU/L   ALT 22 0 - 44 IU/L  CBC     Status: None   Collection Time: 01/18/20 10:02 AM  Result Value Ref Range   WBC 7.6 3.4 - 10.8 x10E3/uL   RBC 5.10 4.14 - 5.80 x10E6/uL   Hemoglobin 15.6 13.0 - 17.7 g/dL   Hematocrit 46.2 37.5 - 51.0 %   MCV 91 79 - 97 fL   MCH 30.6 26.6 - 33.0 pg   MCHC 33.8 31 - 35 g/dL   RDW 12.5 11.6 - 15.4 %   Platelets 259 150 - 450 x10E3/uL  Lipid Panel With LDL/HDL Ratio     Status: Abnormal   Collection Time: 01/18/20 10:02 AM  Result Value Ref Range    Cholesterol, Total 127 100 - 199 mg/dL   Triglycerides 176 (H) 0 - 149 mg/dL   HDL 44 >39 mg/dL   VLDL Cholesterol Cal 29 5 - 40 mg/dL   LDL Chol Calc (NIH) 54 0 - 99 mg/dL   LDL/HDL Ratio 1.2 0.0 - 3.6 ratio    Comment:                                     LDL/HDL Ratio                                             Men  Women  1/2 Avg.Risk  1.0    1.5                                   Avg.Risk  3.6    3.2                                2X Avg.Risk  6.2    5.0                                3X Avg.Risk  8.0    6.1   B12 and Folate Panel     Status: None   Collection Time: 01/18/20 10:02 AM  Result Value Ref Range   Vitamin B-12 512 232 - 1,245 pg/mL   Folate 10.3 >3.0 ng/mL    Comment: A serum folate concentration of less than 3.1 ng/mL is considered to represent clinical deficiency.   T4, free     Status: None   Collection Time: 01/18/20 10:02 AM  Result Value Ref Range   Free T4 1.09 0.82 - 1.77 ng/dL  TSH     Status: None   Collection Time: 01/18/20 10:02 AM  Result Value Ref Range   TSH 1.700 0.450 - 4.500 uIU/mL  Ferritin     Status: None   Collection Time: 01/18/20 10:02 AM  Result Value Ref Range   Ferritin 153 30.0 - 400.0 ng/mL  POCT HgB A1C     Status: Abnormal   Collection Time: 01/24/20  4:09 PM  Result Value Ref Range   Hemoglobin A1C 6.6 (A) 4.0 - 5.6 %   HbA1c POC (<> result, manual entry)     HbA1c, POC (prediabetic range)     HbA1c, POC (controlled diabetic range)    UA/M w/rflx Culture, Routine     Status: None   Collection Time: 01/24/20  4:40 PM   Specimen: Urine   Urine  Result Value Ref Range   Specific Gravity, UA 1.018 1.005 - 1.030   pH, UA 6.0 5.0 - 7.5   Color, UA Yellow Yellow   Appearance Ur Clear Clear   Leukocytes,UA Negative Negative   Protein,UA Negative Negative/Trace   Glucose, UA Negative Negative   Ketones, UA Negative Negative   RBC, UA Negative Negative   Bilirubin, UA Negative Negative    Urobilinogen, Ur 0.2 0.2 - 1.0 mg/dL   Nitrite, UA Negative Negative   Microscopic Examination Comment     Comment: Microscopic follows if indicated.   Microscopic Examination See below:     Comment: Microscopic was indicated and was performed.   Urinalysis Reflex Comment     Comment: This specimen will not reflex to a Urine Culture.  Microscopic Examination     Status: None   Collection Time: 01/24/20  4:40 PM   Urine  Result Value Ref Range   WBC, UA None seen 0 - 5 /hpf   RBC 0-2 0 - 2 /hpf   Epithelial Cells (non renal) None seen 0 - 10 /hpf   Casts None seen None seen /lpf   Bacteria, UA None seen None seen/Few  Rheumatoid factor     Status: Abnormal   Collection Time: 01/25/20 10:09 AM  Result Value Ref Range   Rhuematoid fact SerPl-aCnc 31.3 (H) 0.0 - 13.9 IU/mL  Uric acid  Status: None   Collection Time: 01/25/20 10:09 AM  Result Value Ref Range   Uric Acid 5.0 3.8 - 8.4 mg/dL    Comment:            Therapeutic target for gout patients: <6.0  ANA w/Reflex if Positive     Status: Abnormal   Collection Time: 01/25/20 10:09 AM  Result Value Ref Range   Anti Nuclear Antibody (ANA) Positive (A) Negative   dsDNA Ab <1 0 - 9 IU/mL    Comment:                                    Negative      <5                                    Equivocal  5 - 9                                    Positive      >9    ENA RNP Ab 0.2 0.0 - 0.9 AI   ENA SM Ab Ser-aCnc <0.2 0.0 - 0.9 AI   Scleroderma (Scl-70) (ENA) Antibody, IgG <0.2 0.0 - 0.9 AI   ENA SSA (RO) Ab 1.6 (H) 0.0 - 0.9 AI   ENA SSB (LA) Ab <0.2 0.0 - 0.9 AI   Chromatin Ab SerPl-aCnc <0.2 0.0 - 0.9 AI   Anti JO-1 <0.2 0.0 - 0.9 AI   Centromere Ab Screen <0.2 0.0 - 0.9 AI   See below: Comment     Comment: Autoantibody                       Disease Association ------------------------------------------------------------                         Condition                  Frequency ---------------------   ------------------------    --------- Antinuclear Antibody,    SLE, mixed connective Direct (ANA-D)           tissue diseases ---------------------   ------------------------   --------- dsDNA                    SLE                        40 - 60% ---------------------   ------------------------   --------- Chromatin                Drug induced SLE                90%                          SLE                        48 - 97% ---------------------   ------------------------   --------- SSA (Ro)                 SLE  25 - 35%                          Sjogren's Syndrome         40 - 70%                          Neonatal Lupus                 100% ---------------------   ------------------------   --------- SSB (La)                 SLE                              10%                          Sjogren's Syndrome              30% ---------------------   -----------------------    --------- Sm (anti-Smith)          SLE                        15 - 30% ---------------------   -----------------------    --------- RNP                      Mixed Connective Tissue                          Disease                         95% (U1 nRNP,                SLE                        30 - 50% anti-ribonucleoprotein)  Polymyositis and/or                          Dermatomyositis                 20% ---------------------   ------------------------   --------- Scl-70 (antiDNA          Scleroderma (diffuse)      20 - 35% topoisomerase)           Crest                           13% ---------------------   ------------------------   --------- Jo-1                     Polymyositis and/or                          Dermatomyositis            20 - 40% ---------------------   ------------------------   --------- Centromere B             Scleroderma -  Crest                          variant  80%   Sedimentation rate     Status: None   Collection Time: 01/25/20 10:09 AM  Result Value Ref Range    Sed Rate 12 0 - 30 mm/hr    Assessment/Plan: 1. Encounter for general adult medical examination with abnormal findings Annual health maintenance exam today.  2. Essential (primary) hypertension Generally stable. Continue bp medication as prescribed   3. Impaired fasting blood sugar - POCT HgB A1C 6.6 today.  Written information provided for the patient regarding diabetic meal planning and diet/lifestyle changes he can make to avoid taking diabetic medication. Will monitor blood sugar closely.   4. SOB (shortness of breath) May take rescue inhaler as needed and as prescribed  - albuterol (VENTOLIN HFA) 108 (90 Base) MCG/ACT inhaler; Inhale 2 puffs into the lungs every 6 (six) hours as needed for wheezing or shortness of breath.  Dispense: 18 g; Refill: 5  5. Degenerative disc disease, cervical Will get new imaging of cervical spine for further evaluation. May need referral to orthopedics or pain management  - DG Cervical Spine Complete; Future  6. Degenerative disc disease, lumbar Will get new imaging of lumbar spine. May need referral to orthopedics or pain management  - DG Lumbar Spine Complete; Future  7. Primary generalized (osteo)arthritis Continue to take meloxicam 76m daily as needed for pain/inflammatin. reduced dosing of tramadol 563mto take daliy if needed for moderate pain. Reduced hydrocodone/APAP to 7.5/325mg daily as needed for moderate to severe pain.  Consider referral to orthopedics or pain management after review of ordered x-rays.  - HYDROcodone-acetaminophen (NORCO) 7.5-325 MG tablet; Take 1 tablet by mouth daily as needed for moderate pain.  Dispense: 30 tablet; Refill: 0 - traMADol (ULTRAM) 50 MG tablet; Take 1 tablet (50 mg total) by mouth daily as needed.  Dispense: 30 tablet; Refill: 1  8. Allergic rhinitis, unspecified seasonality, unspecified trigger Use flonase nasal spray, two sprays on both nostrils daily.  - fluticasone (FLONASE) 50 MCG/ACT nasal spray;  Place 2 sprays into both nostrils daily.  Dispense: 16 g; Refill: 5  9. Flu vaccine need Flu vaccine administered in the office today.  - Flu Vaccine MDCK QUAD PF  10. Dysuria - UA/M w/rflx Culture, Routine  General Counseling: Sencere verbalizes understanding of the findings of todays visit and agrees with plan of treatment. I have discussed any further diagnostic evaluation that may be needed or ordered today. We also reviewed his medications today. he has been encouraged to call the office with any questions or concerns that should arise related to todays visit.    Counseling:  Reviewed risks and possible side effects associated with taking opiates, benzodiazepines and other CNS depressants. Combination of these could cause dizziness and drowsiness. Advised patient not to drive or operate machinery when taking these medications, as patient's and other's life can be at risk and will have consequences. Patient verbalized understanding in this matter. Dependence and abuse for these drugs will be monitored closely. A Controlled substance policy and procedure is on file which allows [facility] to order a urine drug screen test at any visit. Patient understands and agrees with the plan  This patient was seen by HeLeretha PolNP Collaboration with Dr FoLavera Guises a part of collaborative care agreement  Orders Placed This Encounter  Procedures  . Microscopic Examination  . DG Cervical Spine Complete  . DG Lumbar Spine Complete  . Flu Vaccine MDCK QUAD PF  . UA/M w/rflx Culture, Routine  . POCT HgB  A1C    Meds ordered this encounter  Medications  . albuterol (VENTOLIN HFA) 108 (90 Base) MCG/ACT inhaler    Sig: Inhale 2 puffs into the lungs every 6 (six) hours as needed for wheezing or shortness of breath.    Dispense:  18 g    Refill:  5    Order Specific Question:   Supervising Provider    Answer:   Lavera Guise [[id number]]  . fluticasone (FLONASE) 50 MCG/ACT nasal spray     Sig: Place 2 sprays into both nostrils daily.    Dispense:  16 g    Refill:  5    Order Specific Question:   Supervising Provider    Answer:   Lavera Guise [[id number]]  . HYDROcodone-acetaminophen (NORCO) 7.5-325 MG tablet    Sig: Take 1 tablet by mouth daily as needed for moderate pain.    Dispense:  30 tablet    Refill:  0    Order Specific Question:   Supervising Provider    Answer:   Lavera Guise [[id number]]  . traMADol (ULTRAM) 50 MG tablet    Sig: Take 1 tablet (50 mg total) by mouth daily as needed.    Dispense:  30 tablet    Refill:  1    Order Specific Question:   Supervising Provider    Answer:   Lavera Guise [[id number]]    Total time spent: 21 Minutes  Time spent includes review of chart, medications, test results, and follow up plan with the patient.     Lavera Guise, MD  Internal Medicine

## 2020-01-25 ENCOUNTER — Other Ambulatory Visit: Payer: Self-pay | Admitting: Nurse Practitioner

## 2020-01-25 DIAGNOSIS — M064 Inflammatory polyarthropathy: Secondary | ICD-10-CM | POA: Diagnosis not present

## 2020-01-25 LAB — MICROSCOPIC EXAMINATION
Bacteria, UA: NONE SEEN
Casts: NONE SEEN /lpf
Epithelial Cells (non renal): NONE SEEN /hpf (ref 0–10)
WBC, UA: NONE SEEN /hpf (ref 0–5)

## 2020-01-25 LAB — UA/M W/RFLX CULTURE, ROUTINE
Bilirubin, UA: NEGATIVE
Glucose, UA: NEGATIVE
Ketones, UA: NEGATIVE
Leukocytes,UA: NEGATIVE
Nitrite, UA: NEGATIVE
Protein,UA: NEGATIVE
RBC, UA: NEGATIVE
Specific Gravity, UA: 1.018 (ref 1.005–1.030)
Urobilinogen, Ur: 0.2 mg/dL (ref 0.2–1.0)
pH, UA: 6 (ref 5.0–7.5)

## 2020-01-26 LAB — RHEUMATOID FACTOR: Rheumatoid fact SerPl-aCnc: 31.3 IU/mL — ABNORMAL HIGH (ref 0.0–13.9)

## 2020-01-26 LAB — ANA W/REFLEX IF POSITIVE
Anti JO-1: <0.2 AI (ref 0.0–0.9)
Anti Nuclear Antibody (ANA): POSITIVE — AB
Centromere Ab Screen: <0.2 AI (ref 0.0–0.9)
Chromatin Ab SerPl-aCnc: <0.2 AI (ref 0.0–0.9)
ENA RNP Ab: 0.2 AI (ref 0.0–0.9)
ENA SM Ab Ser-aCnc: <0.2 AI (ref 0.0–0.9)
ENA SSA (RO) Ab: 1.6 AI — ABNORMAL HIGH (ref 0.0–0.9)
ENA SSB (LA) Ab: <0.2 AI (ref 0.0–0.9)
Scleroderma (Scl-70) (ENA) Antibody, IgG: <0.2 AI (ref 0.0–0.9)
dsDNA Ab: <1 IU/mL (ref 0–9)

## 2020-01-26 LAB — URIC ACID: Uric Acid: 5 mg/dL (ref 3.8–8.4)

## 2020-01-26 LAB — SEDIMENTATION RATE: Sed Rate: 12 mm/hr (ref 0–30)

## 2020-01-29 NOTE — Progress Notes (Signed)
OA/DJD, manage or ortho ( low dose NSAIDS and  low dose tramadol

## 2020-01-29 NOTE — Progress Notes (Signed)
Might need to see rheumatology

## 2020-02-13 DIAGNOSIS — M5030 Other cervical disc degeneration, unspecified cervical region: Secondary | ICD-10-CM | POA: Insufficient documentation

## 2020-02-13 DIAGNOSIS — M5136 Other intervertebral disc degeneration, lumbar region: Secondary | ICD-10-CM | POA: Insufficient documentation

## 2020-02-14 ENCOUNTER — Ambulatory Visit (INDEPENDENT_AMBULATORY_CARE_PROVIDER_SITE_OTHER): Payer: Medicare HMO | Admitting: Nurse Practitioner

## 2020-02-14 ENCOUNTER — Encounter: Payer: Self-pay | Admitting: Nurse Practitioner

## 2020-02-14 ENCOUNTER — Other Ambulatory Visit: Payer: Self-pay

## 2020-02-14 VITALS — BP 161/82 | HR 100 | Temp 97.8°F | Resp 16 | Ht 70.0 in | Wt 220.0 lb

## 2020-02-14 DIAGNOSIS — M5136 Other intervertebral disc degeneration, lumbar region: Secondary | ICD-10-CM

## 2020-02-14 DIAGNOSIS — I10 Essential (primary) hypertension: Secondary | ICD-10-CM

## 2020-02-14 DIAGNOSIS — M5030 Other cervical disc degeneration, unspecified cervical region: Secondary | ICD-10-CM

## 2020-02-14 DIAGNOSIS — M0579 Rheumatoid arthritis with rheumatoid factor of multiple sites without organ or systems involvement: Secondary | ICD-10-CM | POA: Diagnosis not present

## 2020-02-14 DIAGNOSIS — M150 Primary generalized (osteo)arthritis: Secondary | ICD-10-CM | POA: Diagnosis not present

## 2020-02-14 MED ORDER — HYDROCODONE-ACETAMINOPHEN 7.5-325 MG PO TABS: 1.0000 | ORAL_TABLET | Freq: Every day | ORAL | 0 refills | Status: DC | PRN

## 2020-02-14 MED ORDER — TRAMADOL HCL 50 MG PO TABS: 50.0000 mg | ORAL_TABLET | Freq: Two times a day (BID) | ORAL | 1 refills | Status: DC | PRN

## 2020-02-14 NOTE — Progress Notes (Signed)
[facility] [place], [place] [address]  Internal MEDICINE  Office Visit Note  Patient Name: Russell Sullivan  [id number]  [id number]  Date of Service: 02/14/2020  Chief Complaint  Patient presents with  . Follow-up    review xray  . Hypertension  . Hyperlipidemia    The patient is here for follow up visit. Decreased pain medication at his last visit. Now taking tramadol 50mg  once daily if needed for mild to moderate pain. Taking hydrocodone/APAP 7.5/325 mg tablets daily if needed for moderate to severe pain. He does take meloxicam 15mg  daily to keep down pain and inflammation. He states that he is doing ok with the change of medication. Has not been as physically active. Does take acetaminophen in between doses of tramadol and Hydrocodone. He states that this does not do much to help the pain. He had x-rays of the neck and lumbar spine.  The x-ray of cervical spine shows Generalized straightening of the normal cervical lordosis. Mild loss of disc height at C4-C5. Remaining disc spaces are well preserved. There are endplate osteophytes from C2 through the upper thoracic spine. X-ray of the lumbar spine shows Mild loss of disc height at L5-S1 with minor loss of disc height at L4-L5. Remaining disc spaces are well preserved. There are anterior endplate osteophytes from the lower thoracic spine through L5-S1. The patient did have labs done which indicated elevated RA factor and positive ANA. He states that he has seen rheumatology in the past. Has not been for several years.  Blood pressure is slightly elevated today. He states that he left the house without taking his medication.       Current Medication: Outpatient Encounter Medications as of 02/14/2020  Medication Sig  . albuterol (VENTOLIN HFA) 108 (90 Base) MCG/ACT inhaler Inhale 2 puffs into the lungs every 6 (six) hours as needed for wheezing or shortness of breath.  Marland Kitchen amLODipine (NORVASC) 2.5 MG tablet Take 1  tablet (2.5 mg total) by mouth daily.  Marland Kitchen aspirin EC 81 MG tablet Take 1 tablet by mouth daily.  . fluticasone (FLONASE) 50 MCG/ACT nasal spray Place 2 sprays into both nostrils daily.  Marland Kitchen HYDROcodone-acetaminophen (NORCO) 7.5-325 MG tablet Take 1 tablet by mouth daily as needed for moderate pain.  Marland Kitchen levocetirizine (XYZAL) 5 MG tablet Take 1 tablet (5 mg total) by mouth every evening.  . meloxicam (MOBIC) 15 MG tablet Take 1 tablet (15 mg total) by mouth daily.  . pantoprazole (PROTONIX) 40 MG tablet Take 1 tablet (40 mg total) by mouth daily.  . rosuvastatin (CRESTOR) 20 MG tablet Take 1 tablet (20 mg total) by mouth at bedtime.  . solifenacin (VESICARE) 5 MG tablet Take 5 mg by mouth daily.  . tamsulosin (FLOMAX) 0.4 MG CAPS capsule Take 1 capsule (0.4 mg total) by mouth daily.  . traMADol (ULTRAM) 50 MG tablet Take 1 tablet (50 mg total) by mouth every 12 (twelve) hours as needed for moderate pain.  . [DISCONTINUED] HYDROcodone-acetaminophen (NORCO) 7.5-325 MG tablet Take 1 tablet by mouth daily as needed for moderate pain.  . [DISCONTINUED] traMADol (ULTRAM) 50 MG tablet Take 1 tablet (50 mg total) by mouth daily as needed.  . famotidine (PEPCID) 20 MG tablet Take 1 tablet (20 mg total) by mouth 2 (two) times daily for 7 days.   No facility-administered encounter medications on file as of 02/14/2020.    Surgical History: Past Surgical History:  Procedure Laterality Date  . HERNIA REPAIR  2009  .  INTUBATION-ENDOTRACHEAL WITH TRACHEOSTOMY STANDBY N/A 01/13/2018   Procedure: INTUBATION-ENDOTRACHEAL WITH TRACHEOSTOMY STANDBY;  Surgeon: Beverly Gust, MD;  Location: [facility] ORS;  Service: ENT;  Laterality: N/A;  . left shoulder surgery      Medical History: Past Medical History:  Diagnosis Date  . Adenocarcinoma of prostate ([place])   . Hyperlipidemia   . Hypertension     Family History: Family History  Problem Relation Age of Onset  . Hypertension Mother   . Diabetes Mother   .  Hypertension Father   . Diabetes Maternal Grandmother   . Diabetes Paternal Grandmother     Social History   Socioeconomic History  . Marital status: Married    Spouse name: Not on file  . Number of children: Not on file  . Years of education: Not on file  . Highest education level: Not on file  Occupational History  . Not on file  Tobacco Use  . Smoking status: Former Smoker    Types: Cigarettes  . Smokeless tobacco: Never Used  Substance and Sexual Activity  . Alcohol use: Yes    Comment: ocassionally  . Drug use: No  . Sexual activity: Not on file  Other Topics Concern  . Not on file  Social History Narrative  . Not on file   Social Determinants of Health   Financial Resource Strain:   . Difficulty of Paying Living Expenses: Not on file  Food Insecurity:   . Worried About Charity fundraiser in the Last Year: Not on file  . Ran Out of Food in the Last Year: Not on file  Transportation Needs:   . Lack of Transportation (Medical): Not on file  . Lack of Transportation (Non-Medical): Not on file  Physical Activity:   . Days of Exercise per Week: Not on file  . Minutes of Exercise per Session: Not on file  Stress:   . Feeling of Stress : Not on file  Social Connections:   . Frequency of Communication with Friends and Family: Not on file  . Frequency of Social Gatherings with Friends and Family: Not on file  . Attends Religious Services: Not on file  . Active Member of Clubs or Organizations: Not on file  . Attends Archivist Meetings: Not on file  . Marital Status: Not on file  Intimate Partner Violence:   . Fear of Current or Ex-Partner: Not on file  . Emotionally Abused: Not on file  . Physically Abused: Not on file  . Sexually Abused: Not on file      Review of Systems  Constitutional: Positive for activity change. Negative for chills, fatigue and unexpected weight change.       States that he has not been as active last few weeks   HENT:  Negative for congestion, postnasal drip, rhinorrhea, sneezing and sore throat.   Respiratory: Negative for cough, chest tightness, shortness of breath and wheezing.   Cardiovascular: Negative for chest pain and palpitations.       Blood pressure elevated today. States that he has not taken his blood pressure medication yet today.   Gastrointestinal: Negative for abdominal pain, constipation, diarrhea, nausea and vomiting.  Endocrine: Negative for cold intolerance, heat intolerance, polydipsia and polyuria.  Musculoskeletal: Positive for back pain, myalgias, neck pain and neck stiffness. Negative for arthralgias and joint swelling.       Continues to have moderate to severe pain in lower back, especially with exertion.  States that his neck still pops with movement,  especially turning his head from left to right .  Skin: Negative for rash.  Allergic/Immunologic: Negative for environmental allergies.  Neurological: Negative for dizziness, tremors, numbness and headaches.  Hematological: Negative for adenopathy. Does not bruise/bleed easily.  Psychiatric/Behavioral: Negative for behavioral problems (Depression), sleep disturbance and suicidal ideas. The patient is nervous/anxious.    Today's Vitals   02/14/20 0956  BP: (!) 161/82  Pulse: 100  Resp: 16  Temp: 97.8 F (36.6 C)  SpO2: 96%  Weight: 220 lb (99.8 kg)  Height: 5\' 10"  (1.778 m)   Body mass index is 31.57 kg/m.  Physical Exam Vitals and nursing note reviewed.  Constitutional:      Appearance: Normal appearance. He is well-developed. He is not diaphoretic.  HENT:     Head: Normocephalic and atraumatic.     Nose: Nose normal.     Mouth/Throat:     Pharynx: No oropharyngeal exudate.  Eyes:     Pupils: Pupils are equal, round, and reactive to light.  Neck:     Thyroid: No thyromegaly.     Vascular: No JVD.     Trachea: No tracheal deviation.     Comments: Improved ability to turn head from side to side. Still having  popping and crunching in neck with movement. Cardiovascular:     Rate and Rhythm: Normal rate and regular rhythm.     Pulses: Normal pulses.     Heart sounds: Normal heart sounds. No murmur heard.  No friction rub. No gallop.   Pulmonary:     Effort: Pulmonary effort is normal. No respiratory distress.     Breath sounds: Normal breath sounds. No wheezing or rales.  Chest:     Chest wall: No tenderness.  Abdominal:     General: Bowel sounds are normal.     Palpations: Abdomen is soft.     Tenderness: There is no abdominal tenderness.  Musculoskeletal:        General: Normal range of motion.     Cervical back: Normal range of motion and neck supple.     Comments: He is having moderate back pain. This made worse with bending and twisting at the waist.  There are no palpable abnormalities or deformities noted at this time.  Lymphadenopathy:     Cervical: No cervical adenopathy.  Skin:    General: Skin is warm and dry.     Capillary Refill: Capillary refill takes less than 2 seconds.  Neurological:     General: No focal deficit present.     Mental Status: He is alert and oriented to person, place, and time.     Cranial Nerves: No cranial nerve deficit.  Psychiatric:        Mood and Affect: Mood normal.        Behavior: Behavior normal.        Thought Content: Thought content normal.        Judgment: Judgment normal.    Assessment/Plan: 1. Rheumatoid arthritis involving multiple sites with positive rheumatoid factor ([place]) Reviewed labs which show moderately elevated RA factor and positive ANA. Will refer to rheumatology. He has seen [facility]ology provider at [facility] in the past.  - Ambulatory referral to Rheumatology  2. Degenerative disc disease, cervical Reviewed x-ray of cervical spine showing Generalized straightening of the normal cervical lordosis. Mild loss of disc height at C4-C5. Remaining disc spaces are well preserved. There are endplate osteophytes from C2  through the upper thoracic spine. Will refer to rheumatology and possible orthopedics  for further evaluation and treatment.  - Ambulatory referral to Rheumatology  3. Degenerative disc disease, lumbar  X-ray of the lumbar spine shows Mild loss of disc height at L5-S1 with minor loss of disc height at L4-L5. Remaining disc spaces are well preserved. There are anterior endplate osteophytes from the lower thoracic spine through L5-S1. Will refer to rheumatology and possible orthopedics for further evaluation and treatment.  - Ambulatory referral to Rheumatology  4. Primary generalized (osteo)arthritis Continue meloxicam 15mg  daily to reduce inflammation and pain. Increase tramadol 50mg  to twice daily as needed. Hydrocodone/APAP 7.5/325mg  tablets may be taken daily if needed for severe pain. Reviewed risks and possible side effects associated with taking opiates, benzodiazepines and other CNS depressants. Combination of these could cause dizziness and drowsiness. Advised patient not to drive or operate machinery when taking these medications, as patient's and other's life can be at risk and will have consequences. Patient verbalized understanding in this matter. Dependence and abuse for these drugs will be monitored closely. A Controlled substance policy and procedure is on file which allows [facility] to order a urine drug screen test at any visit. Patient understands and agrees with the plan - HYDROcodone-acetaminophen (NORCO) 7.5-325 MG tablet; Take 1 tablet by mouth daily as needed for moderate pain.  Dispense: 30 tablet; Refill: 0 - traMADol (ULTRAM) 50 MG tablet; Take 1 tablet (50 mg total) by mouth every 12 (twelve) hours as needed for moderate pain.  Dispense: 60 tablet; Refill: 1  5. Essential (primary) hypertension Generally stable. Patient needs to be consistent with taking BP medication every day.   General Counseling: Yoshito verbalizes understanding of the findings of todays visit  and agrees with plan of treatment. I have discussed any further diagnostic evaluation that may be needed or ordered today. We also reviewed his medications today. he has been encouraged to call the office with any questions or concerns that should arise related to todays visit.  This patient was seen by Leretha Pol FNP Collaboration with Dr Lavera Guise as a part of collaborative care agreement  Orders Placed This Encounter  Procedures  . Ambulatory referral to Rheumatology    Meds ordered this encounter  Medications  . HYDROcodone-acetaminophen (NORCO) 7.5-325 MG tablet    Sig: Take 1 tablet by mouth daily as needed for moderate pain.    Dispense:  30 tablet    Refill:  0    Order Specific Question:   Supervising Provider    Answer:   Lavera Guise [[id number]]  . traMADol (ULTRAM) 50 MG tablet    Sig: Take 1 tablet (50 mg total) by mouth every 12 (twelve) hours as needed for moderate pain.    Dispense:  60 tablet    Refill:  1    Please d/c tramadol QD prn and do new rx for BID prn. Thanks.    Order Specific Question:   Supervising Provider    Answer:   Lavera Guise [[id number]]    Total time spent: 30 Minutes   Time spent includes review of chart, medications, test results, and follow up plan with the patient.      Dr Lavera Guise Internal medicine

## 2020-02-21 ENCOUNTER — Other Ambulatory Visit: Payer: Self-pay

## 2020-02-21 DIAGNOSIS — K219 Gastro-esophageal reflux disease without esophagitis: Secondary | ICD-10-CM

## 2020-02-21 MED ORDER — PANTOPRAZOLE SODIUM 40 MG PO TBEC: 40.0000 mg | DELAYED_RELEASE_TABLET | Freq: Every day | ORAL | 3 refills | Status: DC

## 2020-02-23 IMAGING — DX DG CHEST 1V PORT
1 series · 1 of 1 positions shown · non-contrast
Comparison: 01/14/2018

CLINICAL DATA: Is shortness of breath

EXAM:
PORTABLE CHEST 1 VIEW

[chest ap]
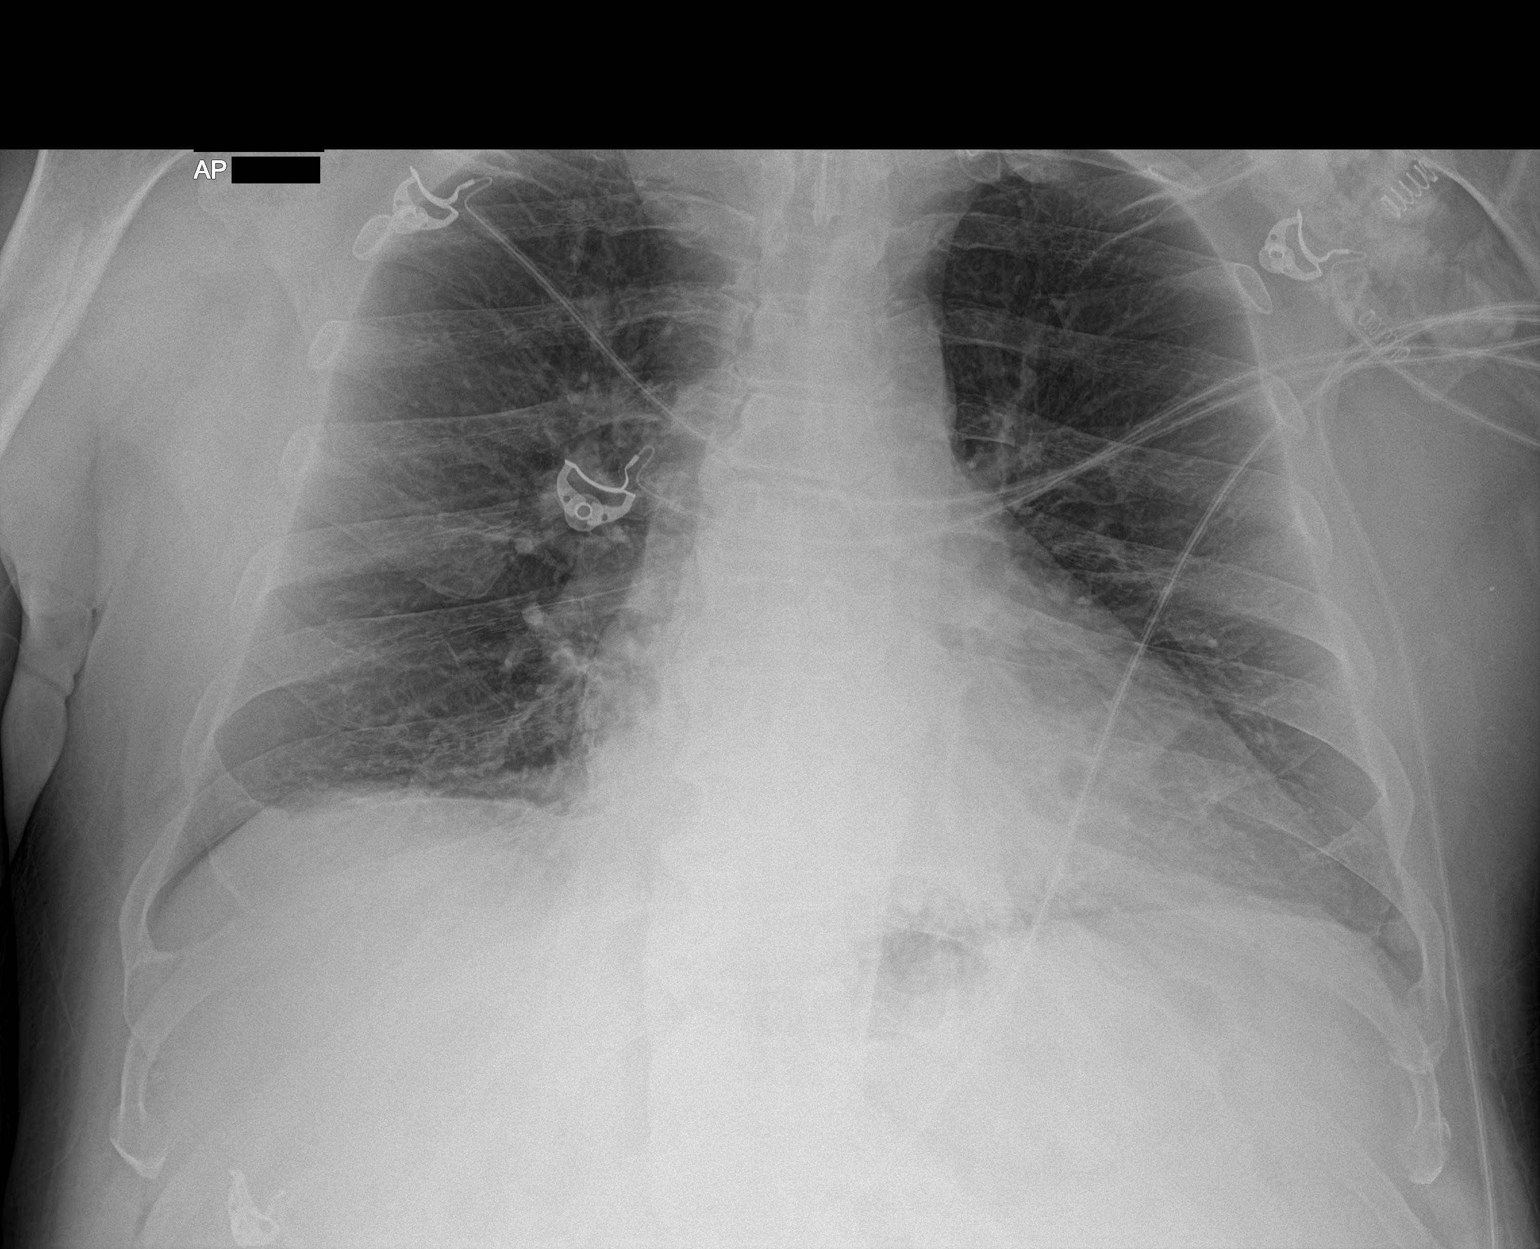

[1 of 1 positions shown; findings below may reference images not displayed]

FINDINGS: Endotracheal tube is unchanged. Mild cardiomegaly. Bibasilar
atelectasis. No effusions or edema. No acute bony abnormality.
IMPRESSION: Cardiomegaly.  Bibasilar atelectasis.

## 2020-03-04 ENCOUNTER — Telehealth: Payer: Self-pay

## 2020-03-04 ENCOUNTER — Other Ambulatory Visit: Payer: Self-pay

## 2020-03-04 DIAGNOSIS — R2232 Localized swelling, mass and lump, left upper limb: Secondary | ICD-10-CM | POA: Diagnosis not present

## 2020-03-04 DIAGNOSIS — I10 Essential (primary) hypertension: Secondary | ICD-10-CM

## 2020-03-04 MED ORDER — AMLODIPINE BESYLATE 2.5 MG PO TABS: 2.5000 mg | ORAL_TABLET | Freq: Every day | ORAL | 3 refills | Status: DC

## 2020-03-04 NOTE — Telephone Encounter (Signed)
Patient called wanting to be seen for finger swelling and pain, advised pt he can walk in with emerg ortho [phone]. Jennessa Trigo

## 2020-03-11 DIAGNOSIS — Z01818 Encounter for other preprocedural examination: Secondary | ICD-10-CM | POA: Diagnosis not present

## 2020-03-11 DIAGNOSIS — R2232 Localized swelling, mass and lump, left upper limb: Secondary | ICD-10-CM | POA: Diagnosis not present

## 2020-03-18 ENCOUNTER — Ambulatory Visit (INDEPENDENT_AMBULATORY_CARE_PROVIDER_SITE_OTHER): Payer: Medicare HMO | Admitting: Nurse Practitioner

## 2020-03-18 ENCOUNTER — Other Ambulatory Visit: Payer: Self-pay

## 2020-03-18 ENCOUNTER — Encounter: Payer: Self-pay | Admitting: Nurse Practitioner

## 2020-03-18 VITALS — BP 152/80 | HR 90 | Temp 98.1°F | Resp 16 | Ht 70.0 in | Wt 224.0 lb

## 2020-03-18 DIAGNOSIS — K219 Gastro-esophageal reflux disease without esophagitis: Secondary | ICD-10-CM | POA: Diagnosis not present

## 2020-03-18 DIAGNOSIS — M150 Primary generalized (osteo)arthritis: Secondary | ICD-10-CM

## 2020-03-18 DIAGNOSIS — R2232 Localized swelling, mass and lump, left upper limb: Secondary | ICD-10-CM | POA: Diagnosis not present

## 2020-03-18 DIAGNOSIS — N402 Nodular prostate without lower urinary tract symptoms: Secondary | ICD-10-CM | POA: Diagnosis not present

## 2020-03-18 DIAGNOSIS — I10 Essential (primary) hypertension: Secondary | ICD-10-CM | POA: Diagnosis not present

## 2020-03-18 MED ORDER — HYDROCODONE-ACETAMINOPHEN 7.5-325 MG PO TABS: 1.0000 | ORAL_TABLET | Freq: Every day | ORAL | 0 refills | Status: DC | PRN

## 2020-03-18 MED ORDER — TRAMADOL HCL 50 MG PO TABS: 50.0000 mg | ORAL_TABLET | Freq: Three times a day (TID) | ORAL | 1 refills | Status: DC | PRN

## 2020-03-18 NOTE — Progress Notes (Addendum)
[facility] [place], [place] [address]  Internal MEDICINE  Office Visit Note  Patient Name: Russell Sullivan  [id number]  [id number]  Date of Service: 03/18/2020  Chief Complaint  Patient presents with  . Follow-up  . Hyperlipidemia  . Hypertension  . controlled substance form    recieved     The patient is here for follow up visit. adjusted pain medication at his last visit. Now taking tramadol 50mg  twice daily if needed for mild to moderate pain. Taking hydrocodone/APAP 7.5/325 mg tablets daily if needed for moderate to severe pain. He does take meloxicam 15mg  daily to keep down pain and inflammation. He states that he is doing ok with the change of medication. Has not been as physically active. Does take acetaminophen in between doses of tramadol and Hydrocodone. He states that this does not do much to help the pain. Labs done previously showed  labs done which indicated elevated RA factor and positive ANA. He has appointment with rheumatology later this week.       Current Medication: Outpatient Encounter Medications as of 03/18/2020  Medication Sig  . albuterol (VENTOLIN HFA) 108 (90 Base) MCG/ACT inhaler Inhale 2 puffs into the lungs every 6 (six) hours as needed for wheezing or shortness of breath.  Marland Kitchen amLODipine (NORVASC) 2.5 MG tablet Take 1 tablet (2.5 mg total) by mouth daily.  Marland Kitchen aspirin EC 81 MG tablet Take 1 tablet by mouth daily.  . fluticasone (FLONASE) 50 MCG/ACT nasal spray Place 2 sprays into both nostrils daily.  Marland Kitchen HYDROcodone-acetaminophen (NORCO) 7.5-325 MG tablet Take 1 tablet by mouth daily as needed for moderate pain.  Marland Kitchen levocetirizine (XYZAL) 5 MG tablet Take 1 tablet (5 mg total) by mouth every evening.  . meloxicam (MOBIC) 15 MG tablet Take 1 tablet (15 mg total) by mouth daily.  . pantoprazole (PROTONIX) 40 MG tablet Take 1 tablet (40 mg total) by mouth daily.  . rosuvastatin (CRESTOR) 20 MG tablet Take 1 tablet (20 mg total) by  mouth at bedtime.  . solifenacin (VESICARE) 5 MG tablet Take 5 mg by mouth daily.  . tamsulosin (FLOMAX) 0.4 MG CAPS capsule Take 1 capsule (0.4 mg total) by mouth daily.  . traMADol (ULTRAM) 50 MG tablet Take 1 tablet (50 mg total) by mouth 3 (three) times daily as needed for moderate pain.  . [DISCONTINUED] HYDROcodone-acetaminophen (NORCO) 7.5-325 MG tablet Take 1 tablet by mouth daily as needed for moderate pain.  . [DISCONTINUED] traMADol (ULTRAM) 50 MG tablet Take 1 tablet (50 mg total) by mouth every 12 (twelve) hours as needed for moderate pain.  . famotidine (PEPCID) 20 MG tablet Take 1 tablet (20 mg total) by mouth 2 (two) times daily for 7 days.   No facility-administered encounter medications on file as of 03/18/2020.    Surgical History: Past Surgical History:  Procedure Laterality Date  . HERNIA REPAIR  2009  . INTUBATION-ENDOTRACHEAL WITH TRACHEOSTOMY STANDBY N/A 01/13/2018   Procedure: INTUBATION-ENDOTRACHEAL WITH TRACHEOSTOMY STANDBY;  Surgeon: Beverly Gust, MD;  Location: [facility] ORS;  Service: ENT;  Laterality: N/A;  . left shoulder surgery      Medical History: Past Medical History:  Diagnosis Date  . Adenocarcinoma of prostate ([place])   . Hyperlipidemia   . Hypertension     Family History: Family History  Problem Relation Age of Onset  . Hypertension Mother   . Diabetes Mother   . Hypertension Father   . Diabetes Maternal Grandmother   . Diabetes  Paternal Grandmother     Social History   Socioeconomic History  . Marital status: Married    Spouse name: Not on file  . Number of children: Not on file  . Years of education: Not on file  . Highest education level: Not on file  Occupational History  . Not on file  Tobacco Use  . Smoking status: Former Smoker    Types: Cigarettes  . Smokeless tobacco: Never Used  Substance and Sexual Activity  . Alcohol use: Yes    Comment: ocassionally  . Drug use: No  . Sexual activity: Not on file  Other Topics  Concern  . Not on file  Social History Narrative  . Not on file   Social Determinants of Health   Financial Resource Strain:   . Difficulty of Paying Living Expenses: Not on file  Food Insecurity:   . Worried About Charity fundraiser in the Last Year: Not on file  . Ran Out of Food in the Last Year: Not on file  Transportation Needs:   . Lack of Transportation (Medical): Not on file  . Lack of Transportation (Non-Medical): Not on file  Physical Activity:   . Days of Exercise per Week: Not on file  . Minutes of Exercise per Session: Not on file  Stress:   . Feeling of Stress : Not on file  Social Connections:   . Frequency of Communication with Friends and Family: Not on file  . Frequency of Social Gatherings with Friends and Family: Not on file  . Attends Religious Services: Not on file  . Active Member of Clubs or Organizations: Not on file  . Attends Archivist Meetings: Not on file  . Marital Status: Not on file  Intimate Partner Violence:   . Fear of Current or Ex-Partner: Not on file  . Emotionally Abused: Not on file  . Physically Abused: Not on file  . Sexually Abused: Not on file      Review of Systems  Constitutional: Positive for activity change. Negative for chills, fatigue and unexpected weight change.       Still has decreased activity levels.  HENT: Negative for congestion, postnasal drip, rhinorrhea, sneezing and sore throat.   Respiratory: Negative for cough, chest tightness, shortness of breath and wheezing.   Cardiovascular: Negative for chest pain and palpitations.       Blood pressure elevated today.   Gastrointestinal: Negative for abdominal pain, constipation, diarrhea, nausea and vomiting.  Endocrine: Negative for cold intolerance, heat intolerance, polydipsia and polyuria.  Musculoskeletal: Positive for back pain, myalgias, neck pain and neck stiffness. Negative for arthralgias and joint swelling.       Continues to have moderate to  severe pain in lower back, especially with exertion.  States that his neck still pops with movement, especially turning his head from left to right .  Skin: Negative for rash.  Allergic/Immunologic: Negative for environmental allergies.  Neurological: Negative for dizziness, tremors, numbness and headaches.  Hematological: Negative for adenopathy. Does not bruise/bleed easily.  Psychiatric/Behavioral: Negative for behavioral problems (Depression), sleep disturbance and suicidal ideas. The patient is nervous/anxious.    Today's Vitals   03/18/20 0841  BP: (!) 152/80  Pulse: 90  Resp: 16  Temp: 98.1 F (36.7 C)  SpO2: 96%  Weight: 224 lb (101.6 kg)  Height: 5\' 10"  (1.778 m)   Body mass index is 32.14 kg/m.  Physical Exam Vitals and nursing note reviewed.  Constitutional:  Appearance: Normal appearance. He is well-developed. He is not diaphoretic.  HENT:     Head: Normocephalic and atraumatic.     Nose: Nose normal.     Mouth/Throat:     Pharynx: No oropharyngeal exudate.  Eyes:     Pupils: Pupils are equal, round, and reactive to light.  Neck:     Thyroid: No thyromegaly.     Vascular: No JVD.     Trachea: No tracheal deviation.     Comments: Still having popping and crunching in neck with movement. Cardiovascular:     Rate and Rhythm: Normal rate and regular rhythm.     Heart sounds: Normal heart sounds. No murmur heard.  No friction rub. No gallop.   Pulmonary:     Effort: Pulmonary effort is normal. No respiratory distress.     Breath sounds: Normal breath sounds. No wheezing or rales.  Chest:     Chest wall: No tenderness.  Abdominal:     Palpations: Abdomen is soft.  Musculoskeletal:        General: Normal range of motion.     Cervical back: Normal range of motion and neck supple.     Comments: He is having moderate back pain. This made worse with bending and twisting at the waist.  There are no palpable abnormalities or deformities noted at this time.   Lymphadenopathy:     Cervical: No cervical adenopathy.  Skin:    General: Skin is warm and dry.     Capillary Refill: Capillary refill takes less than 2 seconds.  Neurological:     General: No focal deficit present.     Mental Status: He is alert and oriented to person, place, and time.     Cranial Nerves: No cranial nerve deficit.  Psychiatric:        Mood and Affect: Mood normal.        Behavior: Behavior normal.        Thought Content: Thought content normal.        Judgment: Judgment normal.    Assessment/Plan: 1. Essential (primary) hypertension Generally well managed. conitnue bp medication as prescribed.   2. Primary generalized (osteo)arthritis Increased tramadol 50mg  to up to three times dail for mild to moderate pain. No increase in hydrocodone/APAP 7.5/325mg  tablets. May only take once daily as needed for severe pain. Patient trying to wean himself off this medication completely. Does have appointment later this week with rheumatology.  - HYDROcodone-acetaminophen (NORCO) 7.5-325 MG tablet; Take 1 tablet by mouth daily as needed for moderate pain.  Dispense: 30 tablet; Refill: 0 - traMADol (ULTRAM) 50 MG tablet; Take 1 tablet (50 mg total) by mouth 3 (three) times daily as needed for moderate pain.  Dispense: 90 tablet; Refill: 1  3. Gastro-esophageal reflux disease without esophagitis Continue pantoprazole daily. May use pepcid twic edaily as neededd   4. Nodular prostate without lower urinary tract symptoms Continue regular visits with urology as scheduled.   General Counseling: Burris verbalizes understanding of the findings of todays visit and agrees with plan of treatment. I have discussed any further diagnostic evaluation that may be needed or ordered today. We also reviewed his medications today. he has been encouraged to call the office with any questions or concerns that should arise related to todays visit.  Hypertension Counseling:   The following  hypertensive lifestyle modification were recommended and discussed:  1. Limiting alcohol intake to less than 1 oz/day of ethanol:(24 oz of beer or 8 oz of wine  or 2 oz of 100-proof whiskey). 2. Take baby ASA 81 mg daily. 3. Importance of regular aerobic exercise and losing weight. 4. Reduce dietary saturated fat and cholesterol intake for overall cardiovascular health. 5. Maintaining adequate dietary potassium, calcium, and magnesium intake. 6. Regular monitoring of the blood pressure. 7. Reduce sodium intake to less than 100 mmol/day (less than 2.3 gm of sodium or less than 6 gm of sodium choride)   This patient was seen by [place] with Dr Lavera Guise as a part of collaborative care agreement  Meds ordered this encounter  Medications  . HYDROcodone-acetaminophen (NORCO) 7.5-325 MG tablet    Sig: Take 1 tablet by mouth daily as needed for moderate pain.    Dispense:  30 tablet    Refill:  0    Order Specific Question:   Supervising Provider    Answer:   Lavera Guise [[id number]]  . traMADol (ULTRAM) 50 MG tablet    Sig: Take 1 tablet (50 mg total) by mouth 3 (three) times daily as needed for moderate pain.    Dispense:  90 tablet    Refill:  1    Please d/c tramadol BID prn and do new rx for TID prn. Thanks.    Order Specific Question:   Supervising Provider    Answer:   Lavera Guise [[id number]]    Total time spent: 30 Minutes   Time spent includes review of chart, medications, test results, and follow up plan with the patient.      Dr Lavera Guise Internal medicine

## 2020-04-15 ENCOUNTER — Ambulatory Visit: Payer: Medicare HMO | Admitting: Nurse Practitioner

## 2020-04-21 ENCOUNTER — Encounter: Payer: Self-pay | Admitting: Nurse Practitioner

## 2020-04-21 ENCOUNTER — Ambulatory Visit (INDEPENDENT_AMBULATORY_CARE_PROVIDER_SITE_OTHER): Payer: Medicare HMO | Admitting: Nurse Practitioner

## 2020-04-21 ENCOUNTER — Other Ambulatory Visit: Payer: Self-pay

## 2020-04-21 VITALS — BP 138/80 | HR 85 | Temp 97.4°F | Resp 16 | Ht 70.0 in | Wt 226.0 lb

## 2020-04-21 DIAGNOSIS — E782 Mixed hyperlipidemia: Secondary | ICD-10-CM | POA: Diagnosis not present

## 2020-04-21 DIAGNOSIS — I10 Essential (primary) hypertension: Secondary | ICD-10-CM | POA: Diagnosis not present

## 2020-04-21 DIAGNOSIS — M150 Primary generalized (osteo)arthritis: Secondary | ICD-10-CM | POA: Diagnosis not present

## 2020-04-21 MED ORDER — HYDROCODONE-ACETAMINOPHEN 7.5-325 MG PO TABS: 1.0000 | ORAL_TABLET | Freq: Every day | ORAL | 0 refills | Status: DC | PRN

## 2020-04-21 NOTE — Progress Notes (Signed)
[facility] [place], [place] [address]  Internal MEDICINE  Office Visit Note  Patient Name: Russell Sullivan  [id number]  [id number]  Date of Service: 05/03/2020  Chief Complaint  Patient presents with  . Follow-up  . Hyperlipidemia  . Hypertension    The patient is here for follow up visit.  -improved blood pressure control.  -seeing urology due to high PSA -due to see rheumatologist  -Changed pain medication regimen. Increased ability to take tramadol to two to three times per day. He has not picked up this prescription yet, though written and sent on 03/18/2020. He also had increased hydrocodone/APAP dosing to 7.5/325mg  tablets, though once daily. He states that he is doing well with this. Is now out of the hydrocodone/ Only single prescription for #30 tablets was sent to his pharmacy. He is also taking meloxicam 15mg  daily.       Current Medication: Outpatient Encounter Medications as of 04/21/2020  Medication Sig  . albuterol (VENTOLIN HFA) 108 (90 Base) MCG/ACT inhaler Inhale 2 puffs into the lungs every 6 (six) hours as needed for wheezing or shortness of breath.  Marland Kitchen amLODipine (NORVASC) 2.5 MG tablet Take 1 tablet (2.5 mg total) by mouth daily.  Marland Kitchen aspirin EC 81 MG tablet Take 1 tablet by mouth daily.  . fluticasone (FLONASE) 50 MCG/ACT nasal spray Place 2 sprays into both nostrils daily.  Marland Kitchen levocetirizine (XYZAL) 5 MG tablet Take 1 tablet (5 mg total) by mouth every evening.  . meloxicam (MOBIC) 15 MG tablet Take 1 tablet (15 mg total) by mouth daily.  . pantoprazole (PROTONIX) 40 MG tablet Take 1 tablet (40 mg total) by mouth daily.  . rosuvastatin (CRESTOR) 20 MG tablet Take 1 tablet (20 mg total) by mouth at bedtime.  . solifenacin (VESICARE) 5 MG tablet Take 5 mg by mouth daily.  . tamsulosin (FLOMAX) 0.4 MG CAPS capsule Take 1 capsule (0.4 mg total) by mouth daily.  . traMADol (ULTRAM) 50 MG tablet Take 1 tablet (50 mg total) by mouth 3  (three) times daily as needed for moderate pain.  . [DISCONTINUED] HYDROcodone-acetaminophen (NORCO) 7.5-325 MG tablet Take 1 tablet by mouth daily as needed for moderate pain.  . famotidine (PEPCID) 20 MG tablet Take 1 tablet (20 mg total) by mouth 2 (two) times daily for 7 days.  Marland Kitchen HYDROcodone-acetaminophen (NORCO) 7.5-325 MG tablet Take 1 tablet by mouth daily as needed for moderate pain.   No facility-administered encounter medications on file as of 04/21/2020.    Surgical History: Past Surgical History:  Procedure Laterality Date  . HERNIA REPAIR  2009  . INTUBATION-ENDOTRACHEAL WITH TRACHEOSTOMY STANDBY N/A 01/13/2018   Procedure: INTUBATION-ENDOTRACHEAL WITH TRACHEOSTOMY STANDBY;  Surgeon: Beverly Gust, MD;  Location: [facility] ORS;  Service: ENT;  Laterality: N/A;  . left shoulder surgery      Medical History: Past Medical History:  Diagnosis Date  . Adenocarcinoma of prostate ([place])   . Hyperlipidemia   . Hypertension     Family History: Family History  Problem Relation Age of Onset  . Hypertension Mother   . Diabetes Mother   . Hypertension Father   . Diabetes Maternal Grandmother   . Diabetes Paternal Grandmother     Social History   Socioeconomic History  . Marital status: Married    Spouse name: Not on file  . Number of children: Not on file  . Years of education: Not on file  . Highest education level: Not on file  Occupational  History  . Not on file  Tobacco Use  . Smoking status: Former Smoker    Types: Cigarettes  . Smokeless tobacco: Never Used  Substance and Sexual Activity  . Alcohol use: Yes    Comment: ocassionally  . Drug use: No  . Sexual activity: Not on file  Other Topics Concern  . Not on file  Social History Narrative  . Not on file   Social Determinants of Health   Financial Resource Strain: Not on file  Food Insecurity: Not on file  Transportation Needs: Not on file  Physical Activity: Not on file  Stress: Not on file  Social  Connections: Not on file  Intimate Partner Violence: Not on file      Review of Systems  Constitutional: Positive for activity change. Negative for chills, fatigue and unexpected weight change.       Still has decreased activity levels.  HENT: Negative for congestion, postnasal drip, rhinorrhea, sneezing and sore throat.   Respiratory: Negative for cough, chest tightness, shortness of breath and wheezing.   Cardiovascular: Negative for chest pain and palpitations.       Improved blood pressure control today  Gastrointestinal: Negative for abdominal pain, constipation, diarrhea, nausea and vomiting.  Endocrine: Negative for cold intolerance, heat intolerance, polydipsia and polyuria.  Musculoskeletal: Positive for back pain, myalgias, neck pain and neck stiffness. Negative for arthralgias and joint swelling.       Continues to have moderate to severe pain in lower back, especially with exertion.  States that his neck still pops with movement, especially turning his head from left to right .  Skin: Negative for rash.  Allergic/Immunologic: Negative for environmental allergies.  Neurological: Negative for dizziness, tremors, numbness and headaches.  Hematological: Negative for adenopathy. Does not bruise/bleed easily.  Psychiatric/Behavioral: Negative for behavioral problems (Depression), sleep disturbance and suicidal ideas. The patient is nervous/anxious.     Today's Vitals   04/21/20 1430  BP: 138/80  Pulse: 85  Resp: 16  Temp: (!) 97.4 F (36.3 C)  SpO2: 98%  Weight: 226 lb (102.5 kg)  Height: 5\' 10"  (1.778 m)   Body mass index is 32.43 kg/m.  Physical Exam Vitals and nursing note reviewed.  Constitutional:      Appearance: Normal appearance. He is well-developed. He is not diaphoretic.  HENT:     Head: Normocephalic and atraumatic.     Nose: Nose normal.     Mouth/Throat:     Pharynx: No oropharyngeal exudate.  Eyes:     Pupils: Pupils are equal, round, and reactive  to light.  Neck:     Thyroid: No thyromegaly.     Vascular: No carotid bruit or JVD.     Trachea: No tracheal deviation.     Comments: Still having popping and crunching in neck with movement. Cardiovascular:     Rate and Rhythm: Normal rate and regular rhythm.     Heart sounds: Normal heart sounds. No murmur heard. No friction rub. No gallop.   Pulmonary:     Effort: Pulmonary effort is normal. No respiratory distress.     Breath sounds: Normal breath sounds. No wheezing or rales.  Chest:     Chest wall: No tenderness.  Abdominal:     Palpations: Abdomen is soft.  Musculoskeletal:        General: Normal range of motion.     Cervical back: Normal range of motion and neck supple.     Comments: He is having moderate back pain. This  made worse with bending and twisting at the waist.  There are no palpable abnormalities or deformities noted at this time.  Lymphadenopathy:     Cervical: No cervical adenopathy.  Skin:    General: Skin is warm and dry.     Capillary Refill: Capillary refill takes less than 2 seconds.  Neurological:     General: No focal deficit present.     Mental Status: He is alert and oriented to person, place, and time.     Cranial Nerves: No cranial nerve deficit.  Psychiatric:        Mood and Affect: Mood normal.        Behavior: Behavior normal.        Thought Content: Thought content normal.        Judgment: Judgment normal.    Assessment/Plan: 1. Essential (primary) hypertension Blood pressure with improved control. Continue bp medication as prescribed   2. Mixed hyperlipidemia Continue rosuvastatin as prescribed .  3. Primary generalized (osteo)arthritis Taking meloxicam 15mg  daily as needed for pain/inflammation. May use tramadol as needed and as previously prescribed for moderate pain. May take hydrocodone/APAP 7.5/325mg  daily if needed for severe pain. A single prescription for #30 tablets sent to his pharmacy today. Patient has upcoming appointment  with  Rheumatology for further evaluaiton.  - HYDROcodone-acetaminophen (NORCO) 7.5-325 MG tablet; Take 1 tablet by mouth daily as needed for moderate pain.  Dispense: 30 tablet; Refill: 0  General Counseling: Kerrie verbalizes understanding of the findings of todays visit and agrees with plan of treatment. I have discussed any further diagnostic evaluation that may be needed or ordered today. We also reviewed his medications today. he has been encouraged to call the office with any questions or concerns that should arise related to todays visit.   This patient was seen by [place] with Dr Lavera Guise as a part of collaborative care agreement  Meds ordered this encounter  Medications  . HYDROcodone-acetaminophen (NORCO) 7.5-325 MG tablet    Sig: Take 1 tablet by mouth daily as needed for moderate pain.    Dispense:  30 tablet    Refill:  0    Please fill prescription for tramadol sent on 03/18/2020. Thanks    Order Specific Question:   Supervising Provider    Answer:   Lavera Guise [id number]    Total time spent: 25 Minutes   Time spent includes review of chart, medications, test results, and follow up plan with the patient.      Dr Lavera Guise Internal medicine

## 2020-04-22 DIAGNOSIS — R2232 Localized swelling, mass and lump, left upper limb: Secondary | ICD-10-CM | POA: Diagnosis not present

## 2020-05-21 ENCOUNTER — Ambulatory Visit: Payer: Medicare HMO | Admitting: Hospice and Palliative Medicine

## 2020-05-21 DIAGNOSIS — D2112 Benign neoplasm of connective and other soft tissue of left upper limb, including shoulder: Secondary | ICD-10-CM | POA: Diagnosis not present

## 2020-05-21 DIAGNOSIS — Z87891 Personal history of nicotine dependence: Secondary | ICD-10-CM | POA: Diagnosis not present

## 2020-05-21 DIAGNOSIS — R2232 Localized swelling, mass and lump, left upper limb: Secondary | ICD-10-CM | POA: Diagnosis not present

## 2020-05-21 DIAGNOSIS — M65842 Other synovitis and tenosynovitis, left hand: Secondary | ICD-10-CM | POA: Diagnosis not present

## 2020-05-21 DIAGNOSIS — Z888 Allergy status to other drugs, medicaments and biological substances status: Secondary | ICD-10-CM | POA: Diagnosis not present

## 2020-05-21 DIAGNOSIS — I10 Essential (primary) hypertension: Secondary | ICD-10-CM | POA: Diagnosis not present

## 2020-05-28 ENCOUNTER — Ambulatory Visit: Payer: Medicare HMO | Admitting: Hospice and Palliative Medicine

## 2020-05-29 DIAGNOSIS — M05741 Rheumatoid arthritis with rheumatoid factor of right hand without organ or systems involvement: Secondary | ICD-10-CM | POA: Diagnosis not present

## 2020-05-29 DIAGNOSIS — M47812 Spondylosis without myelopathy or radiculopathy, cervical region: Secondary | ICD-10-CM | POA: Diagnosis not present

## 2020-05-29 DIAGNOSIS — Z79899 Other long term (current) drug therapy: Secondary | ICD-10-CM | POA: Diagnosis not present

## 2020-05-29 DIAGNOSIS — M05742 Rheumatoid arthritis with rheumatoid factor of left hand without organ or systems involvement: Secondary | ICD-10-CM | POA: Diagnosis not present

## 2020-05-29 DIAGNOSIS — M47816 Spondylosis without myelopathy or radiculopathy, lumbar region: Secondary | ICD-10-CM | POA: Diagnosis not present

## 2020-06-06 ENCOUNTER — Ambulatory Visit (INDEPENDENT_AMBULATORY_CARE_PROVIDER_SITE_OTHER): Payer: Medicare HMO | Admitting: Physician Assistant

## 2020-06-06 ENCOUNTER — Encounter: Payer: Self-pay | Admitting: Physician Assistant

## 2020-06-06 ENCOUNTER — Other Ambulatory Visit: Payer: Self-pay

## 2020-06-06 DIAGNOSIS — R0602 Shortness of breath: Secondary | ICD-10-CM | POA: Diagnosis not present

## 2020-06-06 DIAGNOSIS — C61 Malignant neoplasm of prostate: Secondary | ICD-10-CM | POA: Diagnosis not present

## 2020-06-06 DIAGNOSIS — M150 Primary generalized (osteo)arthritis: Secondary | ICD-10-CM

## 2020-06-06 DIAGNOSIS — E782 Mixed hyperlipidemia: Secondary | ICD-10-CM | POA: Diagnosis not present

## 2020-06-06 DIAGNOSIS — I10 Essential (primary) hypertension: Secondary | ICD-10-CM | POA: Diagnosis not present

## 2020-06-06 DIAGNOSIS — M0579 Rheumatoid arthritis with rheumatoid factor of multiple sites without organ or systems involvement: Secondary | ICD-10-CM | POA: Diagnosis not present

## 2020-06-06 MED ORDER — TRAMADOL HCL 50 MG PO TABS: 50.0000 mg | ORAL_TABLET | Freq: Three times a day (TID) | ORAL | 0 refills | Status: DC | PRN

## 2020-06-06 NOTE — Progress Notes (Signed)
[facility] [place], [place] [address]  Internal MEDICINE  Office Visit Note  Patient Name: Russell Sullivan  [id number]  [id number]  Date of Service: 06/06/2020  Chief Complaint  Patient presents with  . Follow-up  . Hyperlipidemia  . Hypertension    HPI  Pt is here for f/u.  -Seeing urology for prostate cancer. Considered low grade cancer. States he needs Solifenacin. He was getting this from urology but states they are not doing anything more for his cancer so he does not want to keep going there just for this prescrption refill. He will call them to ask about refilling without a visit, or transferring prescription refill to [place]. -Started on methotrexate and is followed by rheumatology for RA. -BP improved today, on norvasc -Has significant pain from RA, DDD. Takes mobic for pain as well as tramadol TID. Had been taking Norco daily as well.  -Nose and mouth will get dry at night and has some difficulty breathing at night when laying down flat. He will often sleep in recliner which he reports helps. Absolutely does not want a sleep study. This has been an ongoing problem, but says he just deals with it. Has used inhaler as needed. He is willing to do an echo. -Had tumor/growth removed from L index finger by [facility].  Current Medication: Outpatient Encounter Medications as of 06/06/2020  Medication Sig  . albuterol (VENTOLIN HFA) 108 (90 Base) MCG/ACT inhaler Inhale 2 puffs into the lungs every 6 (six) hours as needed for wheezing or shortness of breath.  Marland Kitchen amLODipine (NORVASC) 2.5 MG tablet Take 1 tablet (2.5 mg total) by mouth daily.  Marland Kitchen aspirin EC 81 MG tablet Take 1 tablet by mouth daily.  . fluticasone (FLONASE) 50 MCG/ACT nasal spray Place 2 sprays into both nostrils daily.  Marland Kitchen HYDROcodone-acetaminophen (NORCO) 7.5-325 MG tablet Take 1 tablet by mouth daily as needed for moderate pain.  Marland Kitchen levocetirizine (XYZAL) 5 MG tablet Take 1 tablet (5 mg total)  by mouth every evening.  . meloxicam (MOBIC) 15 MG tablet Take 1 tablet (15 mg total) by mouth daily.  . pantoprazole (PROTONIX) 40 MG tablet Take 1 tablet (40 mg total) by mouth daily.  . rosuvastatin (CRESTOR) 20 MG tablet Take 1 tablet (20 mg total) by mouth at bedtime.  . solifenacin (VESICARE) 5 MG tablet Take 5 mg by mouth daily.  . tamsulosin (FLOMAX) 0.4 MG CAPS capsule Take 1 capsule (0.4 mg total) by mouth daily.  . [DISCONTINUED] traMADol (ULTRAM) 50 MG tablet Take 1 tablet (50 mg total) by mouth 3 (three) times daily as needed for moderate pain.  . famotidine (PEPCID) 20 MG tablet Take 1 tablet (20 mg total) by mouth 2 (two) times daily for 7 days.  . traMADol (ULTRAM) 50 MG tablet Take 1 tablet (50 mg total) by mouth 3 (three) times daily as needed for moderate pain.   No facility-administered encounter medications on file as of 06/06/2020.    Surgical History: Past Surgical History:  Procedure Laterality Date  . HERNIA REPAIR  2009  . INTUBATION-ENDOTRACHEAL WITH TRACHEOSTOMY STANDBY N/A 01/13/2018   Procedure: INTUBATION-ENDOTRACHEAL WITH TRACHEOSTOMY STANDBY;  Surgeon: Beverly Gust, MD;  Location: [facility] ORS;  Service: ENT;  Laterality: N/A;  . left shoulder surgery      Medical History: Past Medical History:  Diagnosis Date  . Adenocarcinoma of prostate ([place])   . Hyperlipidemia   . Hypertension     Family History: Family History  Problem Relation  Age of Onset  . Hypertension Mother   . Diabetes Mother   . Hypertension Father   . Diabetes Maternal Grandmother   . Diabetes Paternal Grandmother     Social History   Socioeconomic History  . Marital status: Married    Spouse name: Not on file  . Number of children: Not on file  . Years of education: Not on file  . Highest education level: Not on file  Occupational History  . Not on file  Tobacco Use  . Smoking status: Former Smoker    Types: Cigarettes  . Smokeless tobacco: Never Used  Substance and  Sexual Activity  . Alcohol use: Yes    Comment: ocassionally  . Drug use: No  . Sexual activity: Not on file  Other Topics Concern  . Not on file  Social History Narrative  . Not on file   Social Determinants of Health   Financial Resource Strain: Not on file  Food Insecurity: Not on file  Transportation Needs: Not on file  Physical Activity: Not on file  Stress: Not on file  Social Connections: Not on file  Intimate Partner Violence: Not on file      Review of Systems  Constitutional: Negative for chills, fatigue and unexpected weight change.  HENT: Negative for congestion, rhinorrhea, sneezing and sore throat.   Eyes: Negative for redness.  Respiratory: Positive for shortness of breath. Negative for cough and chest tightness.   Cardiovascular: Negative for chest pain and palpitations.  Gastrointestinal: Negative for abdominal pain, constipation, diarrhea, nausea and vomiting.  Genitourinary: Positive for frequency. Negative for dysuria.  Musculoskeletal: Positive for arthralgias, back pain and joint swelling. Negative for neck pain.  Skin: Negative for rash.  Neurological: Negative.  Negative for tremors and numbness.  Hematological: Negative for adenopathy. Does not bruise/bleed easily.  Psychiatric/Behavioral: Positive for sleep disturbance. Negative for behavioral problems (Depression) and suicidal ideas. The patient is not nervous/anxious.     Vital Signs: BP 139/79   Pulse 84   Temp 97.8 F (36.6 C)   Resp 16   Ht 5\' 10"  (1.778 m)   Wt 223 lb (101.2 kg)   SpO2 97%   BMI 32.00 kg/m    Physical Exam Constitutional:      General: He is not in acute distress.    Appearance: He is well-developed. He is obese. He is not diaphoretic.  HENT:     Head: Normocephalic and atraumatic.     Mouth/Throat:     Pharynx: No oropharyngeal exudate.  Eyes:     Pupils: Pupils are equal, round, and reactive to light.  Neck:     Thyroid: No thyromegaly.     Vascular: No  JVD.     Trachea: No tracheal deviation.  Cardiovascular:     Rate and Rhythm: Normal rate and regular rhythm.     Heart sounds: Normal heart sounds. No murmur heard. No friction rub. No gallop.   Pulmonary:     Effort: Pulmonary effort is normal. No respiratory distress.     Breath sounds: No wheezing or rales.  Chest:     Chest wall: No tenderness.  Abdominal:     General: Bowel sounds are normal.     Palpations: Abdomen is soft.  Musculoskeletal:        General: Tenderness present. Normal range of motion.     Cervical back: Normal range of motion and neck supple.     Comments: Chronic back and joint pain worse with movement  Lymphadenopathy:     Cervical: No cervical adenopathy.  Skin:    General: Skin is warm and dry.  Neurological:     Mental Status: He is alert and oriented to person, place, and time.     Cranial Nerves: No cranial nerve deficit.  Psychiatric:        Behavior: Behavior normal.        Thought Content: Thought content normal.        Judgment: Judgment normal.        Assessment/Plan: 1. SOB (shortness of breath) SOB when laying down flat. Reports he needs to sleep upright in recliner chair. He refuses to do a sleep study. Will start with an echo. He continues to use albuterol inhaler as needed. - ECHO COMPLETE (BACK OFFICE); Future  2. Primary generalized (osteo)arthritis Continues to take mobic and tramadol TID. Was taking Norco as well but will try to go without this. He may need to be seen by ortho or pain management. - traMADol (ULTRAM) 50 MG tablet; Take 1 tablet (50 mg total) by mouth 3 (three) times daily as needed for moderate pain.  Dispense: 90 tablet; Refill: 0  3. Rheumatoid arthritis involving multiple sites with positive rheumatoid factor ([place]) Followed by rheumatology. He was started on methotrexate.  4. Essential (primary) hypertension Stable. Continue amlodipine.  5. Mixed hyperlipidemia Continue Crestor daily.  6. Malignant  neoplasm of prostate [facility]) Seen by urology, per pt there is no further treatment at this time since it was low grade. He was supposed to f/u with them and needs solifenacin refilled. He will call them about this.   General Counseling: [place] verbalizes understanding of the findings of todays visit and agrees with plan of treatment. I have discussed any further diagnostic evaluation that may be needed or ordered today. We also reviewed his medications today. he has been encouraged to call the office with any questions or concerns that should arise related to todays visit.    Orders Placed This Encounter  Procedures  . ECHO COMPLETE (BACK OFFICE)    Meds ordered this encounter  Medications  . traMADol (ULTRAM) 50 MG tablet    Sig: Take 1 tablet (50 mg total) by mouth 3 (three) times daily as needed for moderate pain.    Dispense:  90 tablet    Refill:  0    Please d/c tramadol BID prn and do new rx for TID prn. Thanks.    This patient was seen by Drema Dallas, PA-C in collaboration with Dr. Clayborn Bigness as a part of collaborative care agreement.   Total time spent:30 Minutes Time spent includes review of chart, medications, test results, and follow up plan with the patient.      Dr Lavera Guise Internal medicine

## 2020-06-26 ENCOUNTER — Other Ambulatory Visit: Payer: Self-pay | Admitting: Nurse Practitioner

## 2020-06-26 DIAGNOSIS — I10 Essential (primary) hypertension: Secondary | ICD-10-CM

## 2020-06-27 ENCOUNTER — Other Ambulatory Visit: Payer: Self-pay | Admitting: Nurse Practitioner

## 2020-06-27 DIAGNOSIS — I10 Essential (primary) hypertension: Secondary | ICD-10-CM

## 2020-07-01 ENCOUNTER — Other Ambulatory Visit: Payer: Self-pay

## 2020-07-01 DIAGNOSIS — I10 Essential (primary) hypertension: Secondary | ICD-10-CM

## 2020-07-01 MED ORDER — AMLODIPINE BESYLATE 2.5 MG PO TABS: 2.5000 mg | ORAL_TABLET | Freq: Every day | ORAL | 3 refills | Status: DC

## 2020-07-04 DIAGNOSIS — R972 Elevated prostate specific antigen [PSA]: Secondary | ICD-10-CM | POA: Diagnosis not present

## 2020-07-04 DIAGNOSIS — Z8042 Family history of malignant neoplasm of prostate: Secondary | ICD-10-CM | POA: Diagnosis not present

## 2020-07-04 DIAGNOSIS — Z7982 Long term (current) use of aspirin: Secondary | ICD-10-CM | POA: Diagnosis not present

## 2020-07-04 DIAGNOSIS — N3281 Overactive bladder: Secondary | ICD-10-CM | POA: Diagnosis not present

## 2020-07-04 DIAGNOSIS — C61 Malignant neoplasm of prostate: Secondary | ICD-10-CM | POA: Diagnosis not present

## 2020-07-04 DIAGNOSIS — Z79899 Other long term (current) drug therapy: Secondary | ICD-10-CM | POA: Diagnosis not present

## 2020-07-11 DIAGNOSIS — M19042 Primary osteoarthritis, left hand: Secondary | ICD-10-CM | POA: Diagnosis not present

## 2020-07-11 DIAGNOSIS — M19041 Primary osteoarthritis, right hand: Secondary | ICD-10-CM | POA: Diagnosis not present

## 2020-07-11 DIAGNOSIS — M05741 Rheumatoid arthritis with rheumatoid factor of right hand without organ or systems involvement: Secondary | ICD-10-CM | POA: Diagnosis not present

## 2020-07-11 DIAGNOSIS — M05742 Rheumatoid arthritis with rheumatoid factor of left hand without organ or systems involvement: Secondary | ICD-10-CM | POA: Diagnosis not present

## 2020-07-11 DIAGNOSIS — Z79899 Other long term (current) drug therapy: Secondary | ICD-10-CM | POA: Diagnosis not present

## 2020-07-11 DIAGNOSIS — L570 Actinic keratosis: Secondary | ICD-10-CM | POA: Diagnosis not present

## 2020-07-26 ENCOUNTER — Other Ambulatory Visit: Payer: Self-pay | Admitting: Physician Assistant

## 2020-07-26 DIAGNOSIS — M150 Primary generalized (osteo)arthritis: Secondary | ICD-10-CM

## 2020-08-04 ENCOUNTER — Ambulatory Visit: Payer: Medicare HMO | Admitting: Physician Assistant

## 2020-08-13 ENCOUNTER — Ambulatory Visit (INDEPENDENT_AMBULATORY_CARE_PROVIDER_SITE_OTHER): Payer: Medicare HMO | Admitting: Hospice and Palliative Medicine

## 2020-08-13 ENCOUNTER — Encounter: Payer: Self-pay | Admitting: Hospice and Palliative Medicine

## 2020-08-13 VITALS — BP 171/86 | HR 93 | Temp 98.5°F | Resp 16 | Ht 70.0 in | Wt 223.0 lb

## 2020-08-13 DIAGNOSIS — I10 Essential (primary) hypertension: Secondary | ICD-10-CM

## 2020-08-13 DIAGNOSIS — U071 COVID-19: Secondary | ICD-10-CM | POA: Diagnosis not present

## 2020-08-13 NOTE — Progress Notes (Signed)
[facility] [place], [place] [address]  Internal MEDICINE  Telephone Visit  Patient Name: Russell Sullivan  [id number]  [id number]  Date of Service: 08/13/2020  I connected with the patient at 1651 by telephone and verified the patients identity using two identifiers.   I discussed the limitations, risks, security and privacy concerns of performing an evaluation and management service by telephone and the availability of in person appointments. I also discussed with the patient that there may be a patient responsible charge related to the service.  The patient expressed understanding and agrees to proceed.    Chief Complaint  Patient presents with  . Cancer  . Follow-up    Test positive covid     HPI Patient is being seen virtually today for acute sick visit Wife tested positive for COVID yesterday, he decided to take an at home COVID test today which resulted negative At this time he is not having any symptoms of COVID He is wanting to have a "real" COVID test Offered to provide him address of testing locations--he refused, plans to go to [facility] to be tested like his wife BP is elevated today He feels this is related to the stress as he is unsure as why he had this appointment today and is only wanting to be tested not with a rapid test Denies chest pain   Current Medication: Outpatient Encounter Medications as of 08/13/2020  Medication Sig  . albuterol (VENTOLIN HFA) 108 (90 Base) MCG/ACT inhaler Inhale 2 puffs into the lungs every 6 (six) hours as needed for wheezing or shortness of breath.  Marland Kitchen amLODipine (NORVASC) 2.5 MG tablet Take 1 tablet (2.5 mg total) by mouth daily.  Marland Kitchen aspirin EC 81 MG tablet Take 1 tablet by mouth daily.  . fluticasone (FLONASE) 50 MCG/ACT nasal spray Place 2 sprays into both nostrils daily.  Marland Kitchen HYDROcodone-acetaminophen (NORCO) 7.5-325 MG tablet Take 1 tablet by mouth daily as needed for moderate pain.  Marland Kitchen levocetirizine (XYZAL) 5 MG  tablet Take 1 tablet (5 mg total) by mouth every evening.  . meloxicam (MOBIC) 15 MG tablet Take 1 tablet (15 mg total) by mouth daily.  . methotrexate (RHEUMATREX) 2.5 MG tablet Take by mouth.  . pantoprazole (PROTONIX) 40 MG tablet Take 1 tablet (40 mg total) by mouth daily.  . rosuvastatin (CRESTOR) 20 MG tablet Take 1 tablet (20 mg total) by mouth at bedtime.  . solifenacin (VESICARE) 5 MG tablet Take 5 mg by mouth daily.  . tamsulosin (FLOMAX) 0.4 MG CAPS capsule Take 1 capsule (0.4 mg total) by mouth daily.  . traMADol (ULTRAM) 50 MG tablet TAKE 1 TABLET BY MOUTH THREE TIMES DAILY AS NEEDED FOR MODERATE PAIN  . famotidine (PEPCID) 20 MG tablet Take 1 tablet (20 mg total) by mouth 2 (two) times daily for 7 days.   No facility-administered encounter medications on file as of 08/13/2020.    Surgical History: Past Surgical History:  Procedure Laterality Date  . HERNIA REPAIR  2009  . INTUBATION-ENDOTRACHEAL WITH TRACHEOSTOMY STANDBY N/A 01/13/2018   Procedure: INTUBATION-ENDOTRACHEAL WITH TRACHEOSTOMY STANDBY;  Surgeon: Beverly Gust, MD;  Location: [facility] ORS;  Service: ENT;  Laterality: N/A;  . left shoulder surgery      Medical History: Past Medical History:  Diagnosis Date  . Adenocarcinoma of prostate ([place])   . Hyperlipidemia   . Hypertension     Family History: Family History  Problem Relation Age of Onset  . Hypertension Mother   .  Diabetes Mother   . Hypertension Father   . Diabetes Maternal Grandmother   . Diabetes Paternal Grandmother     Social History   Socioeconomic History  . Marital status: Married    Spouse name: Not on file  . Number of children: Not on file  . Years of education: Not on file  . Highest education level: Not on file  Occupational History  . Not on file  Tobacco Use  . Smoking status: Former Smoker    Types: Cigarettes  . Smokeless tobacco: Never Used  Substance and Sexual Activity  . Alcohol use: Yes    Comment: ocassionally  .  Drug use: No  . Sexual activity: Not on file  Other Topics Concern  . Not on file  Social History Narrative  . Not on file   Social Determinants of Health   Financial Resource Strain: Not on file  Food Insecurity: Not on file  Transportation Needs: Not on file  Physical Activity: Not on file  Stress: Not on file  Social Connections: Not on file  Intimate Partner Violence: Not on file      Review of Systems  Constitutional: Negative for chills, fatigue and unexpected weight change.  HENT: Negative for congestion, postnasal drip, rhinorrhea, sneezing and sore throat.   Eyes: Negative for redness.  Respiratory: Negative for cough, chest tightness and shortness of breath.   Cardiovascular: Negative for chest pain and palpitations.  Gastrointestinal: Negative for abdominal pain, constipation, diarrhea, nausea and vomiting.  Genitourinary: Negative for dysuria and frequency.  Musculoskeletal: Negative for arthralgias, back pain, joint swelling and neck pain.  Skin: Negative for rash.  Neurological: Negative for tremors and numbness.  Hematological: Negative for adenopathy. Does not bruise/bleed easily.  Psychiatric/Behavioral: Negative for behavioral problems (Depression), sleep disturbance and suicidal ideas. The patient is not nervous/anxious.     Vital Signs: BP (!) 171/86   Pulse 93   Temp 98.5 F (36.9 C)   Resp 16   Ht 5\' 10"  (1.778 m)   Wt 223 lb (101.2 kg)   SpO2 97%   BMI 32.00 kg/m    Observation/Objective: Answers questions appropriately, no evidence of acute distress   Assessment/Plan: 1. Acute COVID-19 Advised to rest and increase fluid intake Offered to provide addresses of COVID testing locations  2. Essential hypertension Elevated today--will need close monitoring Advised to monitor at home over the next few days and contact office if BP remains elevated  General Counseling: [place] verbalizes understanding of the findings of today's phone visit  and agrees with plan of treatment. I have discussed any further diagnostic evaluation that may be needed or ordered today. We also reviewed his medications today. he has been encouraged to call the office with any questions or concerns that should arise related to todays visit.   Time spent: 25 Minutes Time spent includes review of chart, medications, test results and follow-up plan with the patient.  Tanna Furry Geniyah Eischeid AGNP-C Internal medicine

## 2020-08-14 ENCOUNTER — Encounter: Payer: Self-pay | Admitting: Hospice and Palliative Medicine

## 2020-08-21 ENCOUNTER — Encounter: Payer: Self-pay | Admitting: Physician Assistant

## 2020-08-21 ENCOUNTER — Other Ambulatory Visit: Payer: Self-pay

## 2020-08-21 ENCOUNTER — Ambulatory Visit (INDEPENDENT_AMBULATORY_CARE_PROVIDER_SITE_OTHER): Payer: Medicare HMO | Admitting: Internal Medicine

## 2020-08-21 VITALS — BP 138/76 | HR 77 | Temp 98.5°F | Resp 16 | Ht 70.0 in | Wt 218.2 lb

## 2020-08-21 DIAGNOSIS — M150 Primary generalized (osteo)arthritis: Secondary | ICD-10-CM | POA: Diagnosis not present

## 2020-08-21 DIAGNOSIS — C61 Malignant neoplasm of prostate: Secondary | ICD-10-CM | POA: Diagnosis not present

## 2020-08-21 DIAGNOSIS — R0989 Other specified symptoms and signs involving the circulatory and respiratory systems: Secondary | ICD-10-CM | POA: Diagnosis not present

## 2020-08-21 DIAGNOSIS — R011 Cardiac murmur, unspecified: Secondary | ICD-10-CM

## 2020-08-21 DIAGNOSIS — R7301 Impaired fasting glucose: Secondary | ICD-10-CM | POA: Diagnosis not present

## 2020-08-21 DIAGNOSIS — I10 Essential (primary) hypertension: Secondary | ICD-10-CM

## 2020-08-21 DIAGNOSIS — M0579 Rheumatoid arthritis with rheumatoid factor of multiple sites without organ or systems involvement: Secondary | ICD-10-CM | POA: Diagnosis not present

## 2020-08-21 LAB — POCT GLYCOSYLATED HEMOGLOBIN (HGB A1C): Hemoglobin A1C: 6.6 % — AB (ref 4.0–5.6)

## 2020-08-21 MED ORDER — HYDROCORTISONE 0.5 % EX OINT: 1.0000 "application " | TOPICAL_OINTMENT | Freq: Two times a day (BID) | CUTANEOUS | 0 refills | Status: AC

## 2020-08-21 MED ORDER — OXYCODONE HCL 5 MG PO CAPS: ORAL_CAPSULE | ORAL | 0 refills | Status: DC

## 2020-08-21 MED ORDER — TRAMADOL HCL 50 MG PO TABS: ORAL_TABLET | ORAL | 2 refills | Status: DC

## 2020-08-21 NOTE — Progress Notes (Signed)
[facility] [place], [place] [address]  Internal MEDICINE  Office Visit Note  Patient Name: Russell Sullivan  [id number]  [id number]  Date of Service: 08/28/2020  Chief Complaint  Patient presents with  . Follow-up    Med refill  . Cancer  . Hyperlipidemia  . Hypertension    HPI Patient is seen with medical problems today for routine follow-up as well. Patient has history of rheumatoid arthritis and is in pain most of the time his lower back hurts all the time.  He has been on different pain medications however his pain is well controlled on oxycodone and he is able to do his ADLs. Patient also has shortness of breath and uses albuterol inhaler on a regular basis. Patient is also has history of prostate cancer and is followed by urology. Patient was supposed to have an echocardiogram for undiagnosed murmur however his goal had to be scheduled that. Patient also has history of carotid atherosclerosis which he is scheduled have a follow-up for progression of disease Current Medication: Outpatient Encounter Medications as of 08/21/2020  Medication Sig  . albuterol (VENTOLIN HFA) 108 (90 Base) MCG/ACT inhaler Inhale 2 puffs into the lungs every 6 (six) hours as needed for wheezing or shortness of breath.  Marland Kitchen amLODipine (NORVASC) 2.5 MG tablet Take 1 tablet (2.5 mg total) by mouth daily.  Marland Kitchen aspirin EC 81 MG tablet Take 1 tablet by mouth daily.  . fluticasone (FLONASE) 50 MCG/ACT nasal spray Place 2 sprays into both nostrils daily.  . hydrocortisone ointment 0.5 % Apply 1 application topically 2 (two) times daily. Prn only for eczema  . levocetirizine (XYZAL) 5 MG tablet Take 1 tablet (5 mg total) by mouth every evening.  . meloxicam (MOBIC) 15 MG tablet Take 1 tablet (15 mg total) by mouth daily.  . methotrexate (RHEUMATREX) 2.5 MG tablet Take by mouth.  Marland Kitchen oxycodone (OXY-IR) 5 MG capsule Take one tab bid prn for pain  . pantoprazole (PROTONIX) 40 MG tablet Take  1 tablet (40 mg total) by mouth daily.  . rosuvastatin (CRESTOR) 20 MG tablet Take 1 tablet (20 mg total) by mouth at bedtime.  . solifenacin (VESICARE) 5 MG tablet Take 5 mg by mouth daily.  . tamsulosin (FLOMAX) 0.4 MG CAPS capsule Take 1 capsule (0.4 mg total) by mouth daily.  . traMADol (ULTRAM) 50 MG tablet Take one tab po bid prn for pain  . [DISCONTINUED] HYDROcodone-acetaminophen (NORCO) 7.5-325 MG tablet Take 1 tablet by mouth daily as needed for moderate pain.  . [DISCONTINUED] traMADol (ULTRAM) 50 MG tablet TAKE 1 TABLET BY MOUTH THREE TIMES DAILY AS NEEDED FOR MODERATE PAIN  . famotidine (PEPCID) 20 MG tablet Take 1 tablet (20 mg total) by mouth 2 (two) times daily for 7 days.   No facility-administered encounter medications on file as of 08/21/2020.    Surgical History: Past Surgical History:  Procedure Laterality Date  . HERNIA REPAIR  2009  . INTUBATION-ENDOTRACHEAL WITH TRACHEOSTOMY STANDBY N/A 01/13/2018   Procedure: INTUBATION-ENDOTRACHEAL WITH TRACHEOSTOMY STANDBY;  Surgeon: Beverly Gust, MD;  Location: [facility] ORS;  Service: ENT;  Laterality: N/A;  . left shoulder surgery      Medical History: Past Medical History:  Diagnosis Date  . Adenocarcinoma of prostate ([place])   . Hyperlipidemia   . Hypertension     Family History: Family History  Problem Relation Age of Onset  . Hypertension Mother   . Diabetes Mother   . Hypertension Father   .  Diabetes Maternal Grandmother   . Diabetes Paternal Grandmother     Social History   Socioeconomic History  . Marital status: Married    Spouse name: Not on file  . Number of children: Not on file  . Years of education: Not on file  . Highest education level: Not on file  Occupational History  . Not on file  Tobacco Use  . Smoking status: Former Smoker    Types: Cigarettes  . Smokeless tobacco: Never Used  . Tobacco comment: 20 year ago  Substance and Sexual Activity  . Alcohol use: Yes    Comment: ocassionally   . Drug use: No  . Sexual activity: Not on file  Other Topics Concern  . Not on file  Social History Narrative  . Not on file   Social Determinants of Health   Financial Resource Strain: Not on file  Food Insecurity: Not on file  Transportation Needs: Not on file  Physical Activity: Not on file  Stress: Not on file  Social Connections: Not on file  Intimate Partner Violence: Not on file      Review of Systems  Constitutional: Negative for chills, fatigue and unexpected weight change.  HENT: Positive for postnasal drip. Negative for congestion, rhinorrhea, sneezing and sore throat.   Eyes: Negative for redness.  Respiratory: Negative for cough, chest tightness and shortness of breath.   Cardiovascular: Negative for chest pain and palpitations.  Gastrointestinal: Negative for abdominal pain, constipation, diarrhea, nausea and vomiting.  Genitourinary: Negative for dysuria and frequency.  Musculoskeletal: Positive for arthralgias and joint swelling. Negative for back pain and neck pain.  Skin: Negative for rash.  Neurological: Negative.  Negative for tremors and numbness.  Hematological: Negative for adenopathy. Does not bruise/bleed easily.  Psychiatric/Behavioral: Negative for behavioral problems (Depression), sleep disturbance and suicidal ideas. The patient is not nervous/anxious.     Vital Signs: BP 138/76   Pulse 77   Temp 98.5 F (36.9 C)   Resp 16   Ht 5\' 10"  (1.778 m)   Wt 218 lb 3.2 oz (99 kg)   SpO2 99%   BMI 31.31 kg/m    Physical Exam Constitutional:      General: He is not in acute distress.    Appearance: He is well-developed. He is not diaphoretic.  HENT:     Head: Normocephalic and atraumatic.     Mouth/Throat:     Pharynx: No oropharyngeal exudate.  Eyes:     Pupils: Pupils are equal, round, and reactive to light.  Neck:     Thyroid: No thyromegaly.     Vascular: Carotid bruit present. No JVD.     Trachea: No tracheal deviation.   Cardiovascular:     Rate and Rhythm: Normal rate and regular rhythm.     Heart sounds: Murmur heard.  No friction rub. No gallop.   Pulmonary:     Effort: Pulmonary effort is normal. No respiratory distress.     Breath sounds: No wheezing or rales.  Chest:     Chest wall: No tenderness.  Abdominal:     General: Bowel sounds are normal.     Palpations: Abdomen is soft.  Musculoskeletal:        General: Normal range of motion.     Cervical back: Normal range of motion and neck supple.  Lymphadenopathy:     Cervical: No cervical adenopathy.  Skin:    General: Skin is warm and dry.  Neurological:     Mental Status: He  is alert and oriented to person, place, and time.     Cranial Nerves: No cranial nerve deficit.  Psychiatric:        Behavior: Behavior normal.        Thought Content: Thought content normal.        Judgment: Judgment normal.        Assessment/Plan: 1. Essential hypertension Blood pressure is under good control.  We will continue all medications as before Continue amlodipine 2.5 mg once a day  2. Primary generalized (osteo)arthritis Patient has generalized osteoarthritis takes meloxicam 15 mg once a day with food patient also continue on Pepcid  3. Rheumatoid arthritis involving multiple sites with positive rheumatoid factor (HCC) RA is under control.  Continue be symptomatic with pain we will continue on methotrexate and for pain management patient is given oxycodone 5 mg twice a day along with tramadol 50 mg 1-3 times a day as needed for breakthrough pain.  This helps to continue his ADL  4. Malignant neoplasm of prostate [facility]) Patient is followed by urology for further recommendations- PSA  5. Impaired fasting glucose His hemoglobin A1c is elevated.  He is right now in the prediabetic range however 6/8 at the most recent A1c 6.6 so patient might need therapy and a glucometer - POCT HgB A1C  6. Undiagnosed cardiac murmurs Reschedule echocardiogram for  undiagnosed murmur - ECHOCARDIOGRAM COMPLETE; Future  7. Bruit of right carotid artery Patient also had a bruit on the right side which he will need carotid Dopplers - US Carotid Bilateral; Future  General Counseling: Troye verbalizes understanding of the findings of todays visit and agrees with plan of treatment. I have discussed any further diagnostic evaluation that may be needed or ordered today. We also reviewed his medications today. he has been encouraged to call the office with any questions or concerns that should arise related to todays visit.    Orders Placed This Encounter  Procedures  . US Carotid Bilateral  . PSA  . POCT HgB A1C  . ECHOCARDIOGRAM COMPLETE    Meds ordered this encounter  Medications  . oxycodone (OXY-IR) 5 MG capsule    Sig: Take one tab bid prn for pain    Dispense:  60 capsule    Refill:  0  . traMADol (ULTRAM) 50 MG tablet    Sig: Take one tab po bid prn for pain    Dispense:  60 tablet    Refill:  2  . hydrocortisone ointment 0.5 %    Sig: Apply 1 application topically 2 (two) times daily. Prn only for eczema    Dispense:  30 g    Refill:  0    This patient was seen by Drema Dallas, PA-C in collaboration with Dr. Clayborn Bigness as a part of collaborative care agreement.   Total time spent:35 Minutes Time spent includes review of chart, medications, test results, and follow up plan with the patient.      Dr Lavera Guise Internal medicine

## 2020-08-22 LAB — PSA: Prostate Specific Ag, Serum: 11 ng/mL — ABNORMAL HIGH (ref 0.0–4.0)

## 2020-08-27 ENCOUNTER — Ambulatory Visit: Payer: Medicare HMO

## 2020-08-27 ENCOUNTER — Other Ambulatory Visit: Payer: Self-pay

## 2020-08-27 DIAGNOSIS — R0989 Other specified symptoms and signs involving the circulatory and respiratory systems: Secondary | ICD-10-CM

## 2020-09-03 ENCOUNTER — Telehealth: Payer: Self-pay | Admitting: Internal Medicine

## 2020-09-03 NOTE — Chronic Care Management (AMB) (Signed)
  Chronic Care Management   Note  09/03/2020 Name: Russell Sullivan MRN: [id number] DOB: Sep 18, 1950  Russell Sullivan is a 70 y.o. year old male who is a primary care patient of Lavera Guise, MD. I reached out to [employer] by phone today in response to a referral sent by [name]'s PCP, Lavera Guise, MD.   [name] was given information about Chronic Care Management services today including:  1. CCM service includes personalized support from designated clinical staff supervised by his physician, including individualized plan of care and coordination with other care providers 2. 24/7 contact phone numbers for assistance for urgent and routine care needs. 3. Service will only be billed when office clinical staff spend 20 minutes or more in a month to coordinate care. 4. Only one practitioner may furnish and bill the service in a calendar month. 5. The patient may stop CCM services at any time (effective at the end of the month) by phone call to the office staff.   Patient agreed to services and verbal consent obtained.   Follow up plan:   Tatjana Secretary/administrator

## 2020-09-10 ENCOUNTER — Ambulatory Visit: Payer: Medicare HMO

## 2020-09-10 ENCOUNTER — Other Ambulatory Visit: Payer: Self-pay

## 2020-09-10 DIAGNOSIS — R011 Cardiac murmur, unspecified: Secondary | ICD-10-CM | POA: Diagnosis not present

## 2020-09-14 NOTE — Procedures (Signed)
[place], [place] [address]  DATE OF SERVICE: Aug 27, 2020  CAROTID DOPPLER INTERPRETATION:  Bilateral Carotid Ultrsasound and Color Doppler Examination was performed. The RIGHT CCA shows no significant plaque in the vessel. The LEFT CCA shows no significant plaque in the vessel. There was no intimal thickening noted in the RIGHT carotid artery. There was no intimal thickening in the LEFT carotid artery.  The RIGHT CCA shows peak systolic velocity of 68 cm per second. The end diastolic velocity is 13 cm per second on the RIGHT side. The RIGHT ICA shows peak systolic velocity of 65 per second. RIGHT sided ICA end diastolic velocity is 18 cm per second. The RIGHT ECA shows a peak systolic velocity of 093 cm per second. The ICA/CCA ratio is calculated to be 0.95. This suggests less than 50% stenosis. The Vertebral Artery shows antegrade flow.  The LEFT CCA shows peak systolic velocity of 80 cm per second. The end diastolic velocity is 13 cm per second on the LEFT side. The LEFT ICA shows peak systolic velocity of 61 per second. LEFT sided ICA end diastolic velocity is 18 cm per second. The LEFT ECA shows a peak systolic velocity of 818 cm per second. The ICA/CCA ratio is calculated to be 0.76. This suggests less than 50% stenosis. The Vertebral Artery shows antegrade flow.   Impression:    The RIGHT CAROTID shows less than 50% stenosis. The LEFT CAROTID shows less than 50% stenosis.  There is no significant plaque formation noted on the LEFT and no significant plaque on the RIGHT  side. Consider a repeat Carotid doppler if clinical situation and symptoms warrant in 6-12 months. Patient should be encouraged to change lifestyles such as smoking cessation, regular exercise and dietary modification. Use of statins in the right clinical setting and ASA is encouraged.  Allyne Gee, MD [facility] Pulmonary Critical Care Medicine

## 2020-09-18 ENCOUNTER — Other Ambulatory Visit: Payer: Self-pay | Admitting: Physician Assistant

## 2020-09-18 DIAGNOSIS — M0579 Rheumatoid arthritis with rheumatoid factor of multiple sites without organ or systems involvement: Secondary | ICD-10-CM

## 2020-09-18 MED ORDER — OXYCODONE HCL 5 MG PO CAPS: ORAL_CAPSULE | ORAL | 0 refills | Status: DC

## 2020-09-23 DIAGNOSIS — Z87891 Personal history of nicotine dependence: Secondary | ICD-10-CM | POA: Diagnosis not present

## 2020-09-23 DIAGNOSIS — I10 Essential (primary) hypertension: Secondary | ICD-10-CM | POA: Diagnosis not present

## 2020-09-23 DIAGNOSIS — E785 Hyperlipidemia, unspecified: Secondary | ICD-10-CM | POA: Diagnosis not present

## 2020-09-23 DIAGNOSIS — Z79891 Long term (current) use of opiate analgesic: Secondary | ICD-10-CM | POA: Diagnosis not present

## 2020-09-23 DIAGNOSIS — C61 Malignant neoplasm of prostate: Secondary | ICD-10-CM | POA: Diagnosis not present

## 2020-10-07 ENCOUNTER — Other Ambulatory Visit: Payer: Self-pay

## 2020-10-07 ENCOUNTER — Other Ambulatory Visit: Payer: Self-pay | Admitting: Nurse Practitioner

## 2020-10-07 ENCOUNTER — Telehealth: Payer: Self-pay

## 2020-10-07 DIAGNOSIS — M150 Primary generalized (osteo)arthritis: Secondary | ICD-10-CM

## 2020-10-07 DIAGNOSIS — K219 Gastro-esophageal reflux disease without esophagitis: Secondary | ICD-10-CM

## 2020-10-07 MED ORDER — MELOXICAM 15 MG PO TABS: 15.0000 mg | ORAL_TABLET | Freq: Every day | ORAL | 3 refills | Status: DC

## 2020-10-07 MED ORDER — ROSUVASTATIN CALCIUM 20 MG PO TABS: 20.0000 mg | ORAL_TABLET | Freq: Every day | ORAL | 3 refills | Status: DC

## 2020-10-07 MED ORDER — PANTOPRAZOLE SODIUM 40 MG PO TBEC: 40.0000 mg | DELAYED_RELEASE_TABLET | Freq: Every day | ORAL | 3 refills | Status: DC

## 2020-10-09 DIAGNOSIS — M05741 Rheumatoid arthritis with rheumatoid factor of right hand without organ or systems involvement: Secondary | ICD-10-CM | POA: Diagnosis not present

## 2020-10-09 DIAGNOSIS — Z79899 Other long term (current) drug therapy: Secondary | ICD-10-CM | POA: Diagnosis not present

## 2020-10-09 DIAGNOSIS — M150 Primary generalized (osteo)arthritis: Secondary | ICD-10-CM | POA: Diagnosis not present

## 2020-10-09 DIAGNOSIS — M05742 Rheumatoid arthritis with rheumatoid factor of left hand without organ or systems involvement: Secondary | ICD-10-CM | POA: Diagnosis not present

## 2020-10-10 ENCOUNTER — Other Ambulatory Visit: Payer: Self-pay | Admitting: Physician Assistant

## 2020-10-10 DIAGNOSIS — C61 Malignant neoplasm of prostate: Secondary | ICD-10-CM | POA: Diagnosis not present

## 2020-10-15 NOTE — Telephone Encounter (Signed)
Lauren informed of changes on pain meds

## 2020-10-16 ENCOUNTER — Other Ambulatory Visit: Payer: Self-pay | Admitting: Physician Assistant

## 2020-10-16 DIAGNOSIS — M150 Primary generalized (osteo)arthritis: Secondary | ICD-10-CM

## 2020-10-16 DIAGNOSIS — M0579 Rheumatoid arthritis with rheumatoid factor of multiple sites without organ or systems involvement: Secondary | ICD-10-CM

## 2020-10-16 MED ORDER — OXYCODONE HCL 5 MG PO TABS: 5.0000 mg | ORAL_TABLET | Freq: Two times a day (BID) | ORAL | 0 refills | Status: DC | PRN

## 2020-10-21 ENCOUNTER — Telehealth: Payer: Self-pay

## 2020-10-21 ENCOUNTER — Other Ambulatory Visit: Payer: Self-pay

## 2020-10-21 DIAGNOSIS — I10 Essential (primary) hypertension: Secondary | ICD-10-CM

## 2020-10-21 MED ORDER — AMLODIPINE BESYLATE 2.5 MG PO TABS: 2.5000 mg | ORAL_TABLET | Freq: Every day | ORAL | 3 refills | Status: DC

## 2020-10-21 NOTE — Telephone Encounter (Signed)
Sent Bp meds to pharmacy, advised pt that his rx for oxycodone was sent to pharmacy on 10/16/20 to be filled on 10/18/20

## 2020-10-27 ENCOUNTER — Telehealth: Payer: Self-pay | Admitting: Pharmacist

## 2020-10-27 NOTE — Progress Notes (Signed)
Chronic Care Management Pharmacy Note  10/29/2020 Name:  Russell Sullivan MRN:  [id number] DOB:  11-Jun-1950  Summary: Initial visit with PharmD for CCM.  Meds list updated, no concerns at this time for patient.  Recommendations/Changes made from today's visit: -Recommend FU A1c, if still elevated would consider Metformin.  Plan: FU 6 months   Subjective: Russell Sullivan is an 70 y.o. year old male who is a primary patient of Humphrey Rolls, Timoteo Gaul, MD.  The CCM team was consulted for assistance with disease management and care coordination needs.    Engaged with patient face to face for initial visit in response to provider referral for pharmacy case management and/or care coordination services.   Consent to Services:  The patient was given the following information about Chronic Care Management services today, agreed to services, and gave verbal consent: 1. CCM service includes personalized support from designated clinical staff supervised by the primary care provider, including individualized plan of care and coordination with other care providers 2. 24/7 contact phone numbers for assistance for urgent and routine care needs. 3. Service will only be billed when office clinical staff spend 20 minutes or more in a month to coordinate care. 4. Only one practitioner may furnish and bill the service in a calendar month. 5.The patient may stop CCM services at any time (effective at the end of the month) by phone call to the office staff. 6. The patient will be responsible for cost sharing (co-pay) of up to 20% of the service fee (after annual deductible is met). Patient agreed to services and consent obtained.  Patient Care Team: Lavera Guise, MD as PCP - General (Internal Medicine) Edythe Clarity, [facility] as Pharmacist (Pharmacist)  Recent office visits:  08/21/20 Dr. Humphrey Rolls For follow-up. STARTED Hydrocortisone 0.2% 1 application Topical 2 times daily, Prn only for eczema, Oxycodone 5 mg Take one tab  bid prn for pain CHANGED Tramadol 50 mg to Take one tab po bid prn for pain STOPPED Hydrocodone-Acetaminophen. 08/13/20 Luiz Ochoa, NP. For acute COVID/Follow-up. No medication changes. 06/06/20 McDonough, Si Gaul, PA-C. For follow-up. No medication changes.    Recent consult visits: 10/09/20 Rheumatology Ephriam Jenkins, K. MD. For follow-up. Np information given. 09/23/20 Oncology Tommie Sams, MD. For prostate cancer. No medication changes. 07/11/20 Rheumatology Posey Pronto, Mayur, K. MD. For follow-up. No medication changes. 07/04/20 Urology Jasmine December. For prostate cancer. No information given. 05/29/20 Rheumatology Posey Pronto, Mayur, K. MD. For initial consult. Per note: Methotrexate 15 mg weekly and Folic Acid 1 mg Daily. 05/21/20 [facility] [place], [name], H. MD. No information given.   Hospital visits:  None in previous 6 months   Medication History: Rosuvastatin 20 mg 90 DS 06/29/20   Objective:  Lab Results  Component Value Date   CREATININE 1.06 01/18/2020   BUN 11 01/18/2020   GFRNONAA 71 01/18/2020   GFRAA 82 01/18/2020   NA 139 01/18/2020   K 5.2 01/18/2020   CALCIUM 10.1 01/18/2020   CO2 25 01/18/2020   GLUCOSE 131 (H) 01/18/2020    Lab Results  Component Value Date/Time   HGBA1C 6.6 (A) 08/21/2020 10:01 AM   HGBA1C 6.6 (A) 01/24/2020 04:09 PM   HGBA1C 6.1 (H) 03/23/2017 09:57 AM    Last diabetic Eye exam: No results found for: HMDIABEYEEXA  Last diabetic Foot exam: No results found for: HMDIABFOOTEX   Lab Results  Component Value Date   CHOL 127 01/18/2020   HDL 44 01/18/2020  LDLCALC 54 01/18/2020   TRIG 176 (H) 01/18/2020    Hepatic Function Latest Ref Rng & Units 01/18/2020 01/31/2019  Total Protein 6.0 - 8.5 g/dL 7.3 7.0  Albumin 3.8 - 4.8 g/dL 4.7 4.6  AST 0 - 40 IU/L 23 25  ALT 0 - 44 IU/L 22 22  Alk Phosphatase 44 - 121 IU/L 117 116  Total Bilirubin 0.0 - 1.2 mg/dL 0.6 0.7    Lab Results   Component Value Date/Time   TSH 1.700 01/18/2020 10:02 AM   TSH 1.080 01/31/2019 09:27 AM   FREET4 1.09 01/18/2020 10:02 AM   FREET4 1.25 01/31/2019 09:27 AM    CBC Latest Ref Rng & Units 01/18/2020 01/31/2019 01/14/2018  WBC 3.4 - 10.8 x10E3/uL 7.6 6.4 7.9  Hemoglobin 13.0 - 17.7 g/dL 15.6 14.8 13.4  Hematocrit 37.5 - 51.0 % 46.2 43.6 40.1  Platelets 150 - 450 x10E3/uL 259 243 251    No results found for: VD25OH  Clinical ASCVD: No  The ASCVD Risk score Mikey Bussing DC Jr., et al., 2013) failed to calculate for the following reasons:   The valid total cholesterol range is 130 to 320 mg/dL    Depression screen [facility] 2/9 08/21/2020 04/21/2020 02/14/2020  Decreased Interest 0 0 0  Down, Depressed, Hopeless 0 0 0  PHQ - 2 Score 0 0 0     Social History   Tobacco Use  Smoking Status Former   Types: Cigarettes  Smokeless Tobacco Never  Tobacco Comments   20 year ago   BP Readings from Last 3 Encounters:  08/21/20 138/76  08/13/20 (!) 171/86  06/06/20 139/79   Pulse Readings from Last 3 Encounters:  08/21/20 77  08/13/20 93  06/06/20 84   Wt Readings from Last 3 Encounters:  08/21/20 218 lb 3.2 oz (99 kg)  08/13/20 223 lb (101.2 kg)  06/06/20 223 lb (101.2 kg)   BMI Readings from Last 3 Encounters:  08/21/20 31.31 kg/m  08/13/20 32.00 kg/m  06/06/20 32.00 kg/m    Assessment/Interventions: Review of patient past medical history, allergies, medications, health status, including review of consultants reports, laboratory and other test data, was performed as part of comprehensive evaluation and provision of chronic care management services.   SDOH:  (Social Determinants of Health) assessments and interventions performed: Yes  Financial Resource Strain: Not on file    SDOH Screenings   Alcohol Screen: Low Risk    Last Alcohol Screening Score (AUDIT): 1  Depression (PHQ2-9): Low Risk    PHQ-2 Score: 0  Financial Resource Strain: Not on file  Food Insecurity: Not on  file  Housing: Not on file  Physical Activity: Not on file  Social Connections: Not on file  Stress: Not on file  Tobacco Use: Medium Risk   Smoking Tobacco Use: Former   Smokeless Tobacco Use: Never  Transportation Needs: Not on file    [place]  Allergies  Allergen Reactions   Lotensin Hct [Benazepril-Hydrochlorothiazide] Swelling    Medications Reviewed Today     Reviewed by Edythe Clarity, [facility] (Pharmacist) on 10/29/20 at [phone]  Med List Status: <None>   Medication Order Taking? Sig Documenting Provider Last Dose Status Informant  albuterol (VENTOLIN HFA) 108 (90 Base) MCG/ACT inhaler [id number] No Inhale 2 puffs into the lungs every 6 (six) hours as needed for wheezing or shortness of breath.  Patient not taking: Reported on 10/29/2020   Ronnell Freshwater, NP Not Taking Active   amLODipine (NORVASC) 2.5 MG tablet [id number]  Take 1 tablet (2.5 mg total) by mouth daily. Lavera Guise, MD  Active   aspirin EC 81 MG tablet [id number] No Take 1 tablet by mouth daily.  Patient not taking: Reported on 10/29/2020   [provider] Not Taking Active   famotidine (PEPCID) 20 MG tablet [id number]  Take 1 tablet (20 mg total) by mouth 2 (two) times daily for 7 days. Hillary Bow, MD  Expired 01/22/18 2359   fluticasone (FLONASE) 50 MCG/ACT nasal spray [id number] No Place 2 sprays into both nostrils daily.  Patient not taking: Reported on 10/29/2020   Ronnell Freshwater, NP Not Taking Active   hydrocortisone ointment 0.5 % [id number] No Apply 1 application topically 2 (two) times daily. Prn only for eczema  Patient not taking: Reported on 10/29/2020   Lavera Guise, MD Not Taking Active   levocetirizine (XYZAL) 5 MG tablet [id number]  Take 1 tablet (5 mg total) by mouth every evening. Ronnell Freshwater, NP  Active   meloxicam (MOBIC) 15 MG tablet [id number]  Take 1 tablet (15 mg total) by mouth daily. Lavera Guise, MD  Active   methotrexate [facility]) 2.5 MG tablet [id number]   Take by mouth. [provider]  Active   oxyCODONE (OXY IR/ROXICODONE) 5 MG immediate release tablet [id number]  Take 1 tablet (5 mg total) by mouth 2 (two) times daily as needed for severe pain. McDonough, Si Gaul, PA-C  Active   pantoprazole (PROTONIX) 40 MG tablet [id number]  Take 1 tablet (40 mg total) by mouth daily. Lavera Guise, MD  Active   rosuvastatin (CRESTOR) 20 MG tablet [id number]  Take 1 tablet (20 mg total) by mouth at bedtime. Lavera Guise, MD  Active   solifenacin (VESICARE) 5 MG tablet [id number]  Take 5 mg by mouth daily. [provider]  Active   tamsulosin (FLOMAX) 0.4 MG CAPS capsule [id number] No Take 1 capsule (0.4 mg total) by mouth daily.  Patient not taking: Reported on 10/29/2020   Ronnell Freshwater, NP Not Taking Active   traMADol Veatrice [place]) 50 MG tablet [id number]  Take one tab po bid prn for pain Lavera Guise, MD  Active             Patient Active Problem List   Diagnosis Date Noted   Degenerative disc disease, cervical 02/13/2020   Degenerative disc disease, lumbar 02/13/2020   Malignant neoplasm of prostate ([place]) 12/08/2019   Encounter for long-term (current) use of medications 11/22/2019   Flu vaccine need 02/23/2019   Encounter for general adult medical examination with abnormal findings 02/04/2019   Encounter for hepatitis C screening test for low risk patient 02/04/2019   Dysuria 02/04/2019   Neck pain, acute 12/08/2018   Closed nondisplaced fracture of second cervical vertebra with routine healing 12/03/2018   Laceration of forehead without complication 01/26/5101   Encounter for long-term current use of high risk medication 12/03/2018   Acute upper respiratory infection 06/16/2018   Allergic rhinitis 06/16/2018   Oral candidiasis 04/24/2018   Pneumonia of left lower lobe due to infectious organism 03/03/2018   SOB (shortness of breath) 03/03/2018   Angioedema 01/13/2018   Localized superficial swelling, mass, or lump 06/28/2017    Impaired fasting blood sugar 04/18/2017   Nodular prostate without lower urinary tract symptoms 04/18/2017   Psoriasis, unspecified 04/18/2017   Low back pain 04/18/2017   Gastro-esophageal reflux disease without esophagitis 04/18/2017   Primary generalized (osteo)arthritis 04/18/2017   Nicotine dependence, cigarettes, uncomplicated 58/52/7782  Opioid use, unspecified, uncomplicated 88/50/2774   Rheumatoid arthritis involving multiple sites with positive rheumatoid factor ([place]) 04/18/2017   Other fatigue 04/18/2017   Essential (primary) hypertension 04/18/2017   Mixed hyperlipidemia 04/18/2017   Elevated PSA 10/18/2014    Immunization History  Administered Date(s) Administered   Influenza Inj Mdck Quad Pf 02/23/2019, 01/25/2020   Influenza, High Dose Seasonal PF 01/30/2018   Influenza-Unspecified 01/29/2017   Moderna Sars-Covid-2 Vaccination 05/25/2019, 06/22/2019   Pneumococcal Polysaccharide-23 01/29/2017   Tdap 01/29/2017   Zoster Recombinat (Shingrix) 09/16/2017, 11/24/2017    Conditions to be addressed/monitored:  HTN, Allergic Rhinitis, GERD, RA, HLD, SOB  Care Plan : General Pharmacy (Adult)  Updates made by Edythe Clarity, RPH since 10/29/2020 12:00 AM     Problem: HTN, Allergic Rhinitis, GERD, RA, HLD, SOB   Priority: High  Onset Date: 10/29/2020     Long-Range Goal: Patient-Specific Goal   Start Date: 10/29/2020  Expected End Date: 05/01/2021  This Visit's Progress: On track  Priority: High  Note:   Current Barriers:  None at this time  Pharmacist Clinical Goal(s):  Patient will achieve adherence to monitoring guidelines and medication adherence to achieve therapeutic efficacy contact provider office for questions/concerns as evidenced notation of same in electronic health record through collaboration with PharmD and provider.   Interventions: 1:1 collaboration with Lavera Guise, MD regarding development and update of comprehensive plan of care as  evidenced by provider attestation and co-signature Inter-disciplinary care team collaboration (see longitudinal plan of care) Comprehensive medication review performed; medication list updated in electronic medical record  Hypertension (BP goal <140/90) -Controlled -Current treatment: Amlodipine 2.45m once daily -Medications previously tried: benazepril/HCTZ  -Current home readings: no logs, reports "good"  around 135-140/80s -Current dietary habits: has cut back on sweets and alcohol -Current exercise habits: active around the house -Denies hypotensive/hypertensive symptoms -Educated on BP goals and benefits of medications for prevention of heart attack, stroke and kidney damage; Exercise goal of 150 minutes per week; Importance of home blood pressure monitoring; Symptoms of hypotension and importance of maintaining adequate hydration; -Counseled to monitor BP at home at least weekly, document, and provide log at future appointments -Recommended to continue current medication  Hyperlipidemia: (LDL goal < 100) -Controlled -Current treatment: Rosuvastatin 258mdaily -Medications previously tried: none noted  -Current dietary patterns: has cut back on sweets, trying to cut back on alcohol -Current exercise habits: minimal, very active with work around the house -Educated on Cholesterol goals;  Benefits of statin for ASCVD risk reduction; Importance of limiting foods high in cholesterol; -Most recent LDL is excellent -Recommended to continue current medication  SOB (Goal: Minimize symptoms) -Controlled -Current treatment  None -Medications previously tried: Albuterol HFA -no longer really using the rescue inhaler, he does have on hand but states he has not used it in about 6 months  -Recommended continue current management strategies  GERD (Goal: Minimize sypmtoms) -Controlled -Current treatment  Pantoprazole 4053maily -Medications previously tried: none noted -Denies any  recent symptoms, does have symptoms if he does not take medication  -Recommended to continue current medication  RA(Goal: Reduce/minimze symptoms) -Controlled -Current treatment  Methotrexate 2.5mg45mtabs once weekly Folic Acid 1mg 79mly Tramadol 50mg 33modone 5mg Mo46m 15mg da43m-Medications previously tried: none noted -Still has pain in hands, shoulders, and back -Recommend pool exercise for pain relief  -Recommended Continue current medications  Pre-Diabetes (A1c goal <6.5%) -Uncontrolled -Current medications: None -Medications previously tried: none  -Current home glucose readings fasting glucose: not  monitoring post prandial glucose: not monitoring -Denies hypoglycemic/hyperglycemic symptoms -Discussed A1c trend and is now in diabetic range.  Patient reports he has made some diet changes including cutting back on sweets and trying to cut back on alcohol.  Will have some alcoholic beverages mixed with coke.   -Educated on A1c and blood sugar goals; Exercise goal of 150 minutes per week; Benefits of weight loss; -Counseled to check feet daily and get yearly eye exams -Recommended continue to work on diet/lifestyle.   -Recommend FU A1c, if still elevated would consider Metformin.   Patient Goals/Self-Care Activities Patient will:  - take medications as prescribed focus on medication adherence by pill box check blood pressure periodically at home, document, and provide at future appointments target a minimum of 150 minutes of moderate intensity exercise weekly  Follow Up Plan: The care management team will reach out to the patient again over the next 180 days.       Medication Assistance: None required.  Patient affirms current coverage meets needs.  Compliance/Adherence/Medication fill history: Care Gaps: PNA Vaccines  Star-Rating Drugs: Rosuvastatin 20 mg 90 DS 06/29/20  Patient's preferred pharmacy is:  [facility] [address] [place] [address] Phone: [phone] Fax: [phone]  Uses pill box? Yes Pt endorses 100% compliance  We discussed: Benefits of medication synchronization, packaging and delivery as well as enhanced pharmacist oversight with [employer]. Patient decided to: Continue current medication management strategy  Care Plan and Follow Up Patient Decision:  Patient agrees to Care Plan and Follow-up.  Plan: The care management team will reach out to the patient again over the next 180 days.  Beverly Milch, PharmD Clinical Pharmacist [facility] [phone]

## 2020-10-27 NOTE — Progress Notes (Addendum)
Chronic Care Management Pharmacy Assistant   Name: Russell Sullivan  MRN: [id number] DOB: 04-24-1950  Russell Sullivan is an 70 y.o. year old male who presents for his initial CCM visit with the clinical pharmacist.  Reason for Encounter: Chart Prep    Conditions to be addressed/monitored: HTN, GERD,HLD.  Primary concerns for visit include: HTN.  Recent office visits:  08/21/20 Dr. Humphrey Rolls For follow-up. STARTED Hydrocortisone 3.5% 1 application Topical 2 times daily, Prn only for eczema, Oxycodone 5 mg Take one tab bid prn for pain CHANGED Tramadol 50 mg to Take one tab po bid prn for pain STOPPED Hydrocodone-Acetaminophen.  08/13/20 Luiz Ochoa, NP. For acute COVID/Follow-up. No medication changes.  06/06/20 McDonough, Si Gaul, PA-C. For follow-up. No medication changes.   Recent consult visits: 10/09/20 Rheumatology Ephriam Jenkins, K. MD. For follow-up. Np information given. 09/23/20 Oncology Tommie Sams, MD. For prostate cancer. No medication changes.  07/11/20 Rheumatology Posey Pronto, Mayur, K. MD. For follow-up. No medication changes. 07/04/20 Urology Jasmine December. For prostate cancer. No information given.  05/29/20 Rheumatology Posey Pronto, Mayur, K. MD. For initial consult. Per note: Methotrexate 15 mg weekly and Folic Acid 1 mg Daily. 05/21/20 [facility] [place], [name], H. MD. No information given.  Hospital visits:  None in previous 6 months  Medication History: Rosuvastatin 20 mg 90 DS 06/29/20  Medications: Outpatient Encounter Medications as of 10/27/2020  Medication Sig   albuterol (VENTOLIN HFA) 108 (90 Base) MCG/ACT inhaler Inhale 2 puffs into the lungs every 6 (six) hours as needed for wheezing or shortness of breath.   amLODipine (NORVASC) 2.5 MG tablet Take 1 tablet (2.5 mg total) by mouth daily.   aspirin EC 81 MG tablet Take 1 tablet by mouth daily.   famotidine (PEPCID) 20 MG tablet Take 1 tablet (20 mg total) by  mouth 2 (two) times daily for 7 days.   fluticasone (FLONASE) 50 MCG/ACT nasal spray Place 2 sprays into both nostrils daily.   hydrocortisone ointment 0.5 % Apply 1 application topically 2 (two) times daily. Prn only for eczema   levocetirizine (XYZAL) 5 MG tablet Take 1 tablet (5 mg total) by mouth every evening.   meloxicam (MOBIC) 15 MG tablet Take 1 tablet (15 mg total) by mouth daily.   methotrexate (RHEUMATREX) 2.5 MG tablet Take by mouth.   oxyCODONE (OXY IR/ROXICODONE) 5 MG immediate release tablet Take 1 tablet (5 mg total) by mouth 2 (two) times daily as needed for severe pain.   pantoprazole (PROTONIX) 40 MG tablet Take 1 tablet (40 mg total) by mouth daily.   rosuvastatin (CRESTOR) 20 MG tablet Take 1 tablet (20 mg total) by mouth at bedtime.   solifenacin (VESICARE) 5 MG tablet Take 5 mg by mouth daily.   tamsulosin (FLOMAX) 0.4 MG CAPS capsule Take 1 capsule (0.4 mg total) by mouth daily.   traMADol (ULTRAM) 50 MG tablet Take one tab po bid prn for pain   No facility-administered encounter medications on file as of 10/27/2020.    Have you seen any other providers since your last visit? Patient stated no.  Any changes in your medications or health? Patient stated no.  Any side effects from any medications? Patient stated no.  Do you have an symptoms or problems not managed by your medications? Patient stated no.  Any concerns about your health right now? Patient stated no.   Has your provider asked that you check blood pressure, blood sugar, or follow special  diet at home? Patient stated he monitors his blood pressure at home.  Do you get any type of exercise on a regular basis? Patient stated no.   Can you think of a goal you would like to reach for your health? Patient stated his arthritis pain.   Do you have any problems getting your medications? Patient stated no.  Is there anything that you would like to discuss during the appointment? Patient stated no.  Please  bring medications and supplements to appointment., patient reminded of his face to face appointment on 10/29/20 at 9 am.   [place] [place] Pharmacist Assistant [phone]

## 2020-10-29 ENCOUNTER — Ambulatory Visit: Payer: Medicare HMO | Admitting: Pharmacist

## 2020-10-29 ENCOUNTER — Other Ambulatory Visit: Payer: Self-pay

## 2020-10-29 DIAGNOSIS — I10 Essential (primary) hypertension: Secondary | ICD-10-CM

## 2020-10-29 DIAGNOSIS — M0579 Rheumatoid arthritis with rheumatoid factor of multiple sites without organ or systems involvement: Secondary | ICD-10-CM

## 2020-10-29 DIAGNOSIS — E782 Mixed hyperlipidemia: Secondary | ICD-10-CM

## 2020-10-29 DIAGNOSIS — R0602 Shortness of breath: Secondary | ICD-10-CM

## 2020-10-29 DIAGNOSIS — J309 Allergic rhinitis, unspecified: Secondary | ICD-10-CM

## 2020-10-29 MED ORDER — LEVOCETIRIZINE DIHYDROCHLORIDE 5 MG PO TABS
5.0000 mg | ORAL_TABLET | Freq: Every evening | ORAL | 3 refills | Status: DC
Start: 2020-10-29 — End: 2021-11-12

## 2020-10-29 NOTE — Patient Instructions (Addendum)
Visit Information   Goals Addressed             This Visit's Progress    Track and Manage My Blood Pressure-Hypertension       Timeframe:  Long-Range Goal Priority:  High Start Date:  10/29/20                           Expected End Date: 05/01/21                      Follow Up Date 02/09/21    - check blood pressure weekly - choose a place to take my blood pressure (home, clinic or office, retail store) - write blood pressure results in a log or diary    Why is this important?   You won't feel high blood pressure, but it can still hurt your blood vessels.  High blood pressure can cause heart or kidney problems. It can also cause a stroke.  Making lifestyle changes like losing a little weight or eating less salt will help.  Checking your blood pressure at home and at different times of the day can help to control blood pressure.  If the doctor prescribes medicine remember to take it the way the doctor ordered.  Call the office if you cannot afford the medicine or if there are questions about it.     Notes:        Patient Care Plan: General Pharmacy (Adult)     Problem Identified: HTN, Allergic Rhinitis, GERD, RA, HLD, SOB   Priority: High  Onset Date: 10/29/2020     Long-Range Goal: Patient-Specific Goal   Start Date: 10/29/2020  Expected End Date: 05/01/2021  This Visit's Progress: On track  Priority: High  Note:   Current Barriers:  None at this time  Pharmacist Clinical Goal(s):  Patient will achieve adherence to monitoring guidelines and medication adherence to achieve therapeutic efficacy contact provider office for questions/concerns as evidenced notation of same in electronic health record through collaboration with PharmD and provider.   Interventions: 1:1 collaboration with Lavera Guise, MD regarding development and update of comprehensive plan of care as evidenced by provider attestation and co-signature Inter-disciplinary care team collaboration (see  longitudinal plan of care) Comprehensive medication review performed; medication list updated in electronic medical record  Hypertension (BP goal <140/90) -Controlled -Current treatment: Amlodipine 2.5mg  once daily -Medications previously tried: benazepril/HCTZ  -Current home readings: no logs, reports "good"  around 135-140/80s -Current dietary habits: has cut back on sweets and alcohol -Current exercise habits: active around the house -Denies hypotensive/hypertensive symptoms -Educated on BP goals and benefits of medications for prevention of heart attack, stroke and kidney damage; Exercise goal of 150 minutes per week; Importance of home blood pressure monitoring; Symptoms of hypotension and importance of maintaining adequate hydration; -Counseled to monitor BP at home at least weekly, document, and provide log at future appointments -Recommended to continue current medication  Hyperlipidemia: (LDL goal < 100) -Controlled -Current treatment: Rosuvastatin 20mg  daily -Medications previously tried: none noted  -Current dietary patterns: has cut back on sweets, trying to cut back on alcohol -Current exercise habits: minimal, very active with work around the house -Educated on Cholesterol goals;  Benefits of statin for ASCVD risk reduction; Importance of limiting foods high in cholesterol; -Most recent LDL is excellent -Recommended to continue current medication  SOB (Goal: Minimize symptoms) -Controlled -Current treatment  None -Medications previously tried: Albuterol HFA -no longer  really using the rescue inhaler, he does have on hand but states he has not used it in about 6 months  -Recommended continue current management strategies  GERD (Goal: Minimize sypmtoms) -Controlled -Current treatment  Pantoprazole 40mg  daily -Medications previously tried: none noted -Denies any recent symptoms, does have symptoms if he does not take medication  -Recommended to continue  current medication  RA(Goal: Reduce/minimze symptoms) -Controlled -Current treatment  Methotrexate 2.5mg  6 tabs once weekly Folic Acid 1mg  daily Tramadol 50mg  Oxycodone 5mg  Mobic 15mg  daily -Medications previously tried: none noted -Still has pain in hands, shoulders, and back -Recommend pool exercise for pain relief  -Recommended Continue current medications  Pre-Diabetes (A1c goal <6.5%) -Uncontrolled -Current medications: None -Medications previously tried: none  -Current home glucose readings fasting glucose: not monitoring post prandial glucose: not monitoring -Denies hypoglycemic/hyperglycemic symptoms -Discussed A1c trend and is now in diabetic range.  Patient reports he has made some diet changes including cutting back on sweets and trying to cut back on alcohol.  Will have some alcoholic beverages mixed with coke.   -Educated on A1c and blood sugar goals; Exercise goal of 150 minutes per week; Benefits of weight loss; -Counseled to check feet daily and get yearly eye exams -Recommended continue to work on diet/lifestyle.   -Recommend FU A1c, if still elevated would consider Metformin.   Patient Goals/Self-Care Activities Patient will:  - take medications as prescribed focus on medication adherence by pill box check blood pressure periodically at home, document, and provide at future appointments target a minimum of 150 minutes of moderate intensity exercise weekly  Follow Up Plan: The care management team will reach out to the patient again over the next 180 days.      Mr. Raper was given information about Chronic Care Management services today including:  CCM service includes personalized support from designated clinical staff supervised by his physician, including individualized plan of care and coordination with other care providers 24/7 contact phone numbers for assistance for urgent and routine care needs. Standard insurance, coinsurance, copays and  deductibles apply for chronic care management only during months in which we provide at least 20 minutes of these services. Most insurances cover these services at 100%, however patients may be responsible for any copay, coinsurance and/or deductible if applicable. This service may help you avoid the need for more expensive face-to-face services. Only one practitioner may furnish and bill the service in a calendar month. The patient may stop CCM services at any time (effective at the end of the month) by phone call to the office staff.  Patient agreed to services and verbal consent obtained.   The patient verbalized understanding of instructions, educational materials, and care plan provided today and agreed to receive a mailed copy of patient instructions, educational materials, and care plan.  Telephone follow up appointment with pharmacy team member scheduled for:6 months  Edythe Clarity, Time

## 2020-10-31 ENCOUNTER — Telehealth: Payer: Self-pay | Admitting: Pharmacist

## 2020-10-31 NOTE — Progress Notes (Addendum)
    Chronic Care Management Pharmacy Assistant   Name: Russell Sullivan  MRN: [id number] DOB: [birth year]  Reason for Encounter: CCM Care Plan   Conditions to be addressed/monitored: HTN, Allergic Rhinitis, GERD, RA, HLD, SOB  Medications: Outpatient Encounter Medications as of 10/31/2020  Medication Sig   albuterol (VENTOLIN HFA) 108 (90 Base) MCG/ACT inhaler Inhale 2 puffs into the lungs every 6 (six) hours as needed for wheezing or shortness of breath. (Patient not taking: Reported on 10/29/2020)   amLODipine (NORVASC) 2.5 MG tablet Take 1 tablet (2.5 mg total) by mouth daily.   aspirin EC 81 MG tablet Take 1 tablet by mouth daily. (Patient not taking: Reported on 10/29/2020)   famotidine (PEPCID) 20 MG tablet Take 1 tablet (20 mg total) by mouth 2 (two) times daily for 7 days.   fluticasone (FLONASE) 50 MCG/ACT nasal spray Place 2 sprays into both nostrils daily. (Patient not taking: Reported on 10/29/2020)   hydrocortisone ointment 0.5 % Apply 1 application topically 2 (two) times daily. Prn only for eczema (Patient not taking: Reported on 10/29/2020)   levocetirizine (XYZAL) 5 MG tablet Take 1 tablet (5 mg total) by mouth every evening.   meloxicam (MOBIC) 15 MG tablet Take 1 tablet (15 mg total) by mouth daily.   methotrexate (RHEUMATREX) 2.5 MG tablet Take by mouth.   oxyCODONE (OXY IR/ROXICODONE) 5 MG immediate release tablet Take 1 tablet (5 mg total) by mouth 2 (two) times daily as needed for severe pain.   pantoprazole (PROTONIX) 40 MG tablet Take 1 tablet (40 mg total) by mouth daily.   rosuvastatin (CRESTOR) 20 MG tablet Take 1 tablet (20 mg total) by mouth at bedtime.   solifenacin (VESICARE) 5 MG tablet Take 5 mg by mouth daily.   tamsulosin (FLOMAX) 0.4 MG CAPS capsule Take 1 capsule (0.4 mg total) by mouth daily. (Patient not taking: Reported on 10/29/2020)   traMADol (ULTRAM) 50 MG tablet Take one tab po bid prn for pain   No facility-administered encounter medications on file  as of 10/31/2020.   Reviewed the patients initial visit reinsured it was completed per the pharmacist Russell Sullivan request. Printed the CCM care plan. Mailed the patient CCM care plan to their most recent address on file.   Follow-Up:Pharmacist Review  Charlann Lange, [place] Pharmacist Assistant [phone]

## 2020-11-06 DIAGNOSIS — Z87891 Personal history of nicotine dependence: Secondary | ICD-10-CM | POA: Diagnosis not present

## 2020-11-06 DIAGNOSIS — C61 Malignant neoplasm of prostate: Secondary | ICD-10-CM | POA: Diagnosis not present

## 2020-11-06 DIAGNOSIS — M1990 Unspecified osteoarthritis, unspecified site: Secondary | ICD-10-CM | POA: Diagnosis not present

## 2020-11-06 DIAGNOSIS — E785 Hyperlipidemia, unspecified: Secondary | ICD-10-CM | POA: Diagnosis not present

## 2020-11-06 DIAGNOSIS — I10 Essential (primary) hypertension: Secondary | ICD-10-CM | POA: Diagnosis not present

## 2020-11-06 DIAGNOSIS — N4231 Prostatic intraepithelial neoplasia: Secondary | ICD-10-CM | POA: Diagnosis not present

## 2020-11-07 ENCOUNTER — Telehealth: Payer: Self-pay | Admitting: Pharmacist

## 2020-11-07 NOTE — Progress Notes (Addendum)
    Chronic Care Management Pharmacy Assistant   Name: Russell Sullivan  MRN: [id number] DOB: 06/29/1950  Reason for Encounter: Adherence Review  Medications: Outpatient Encounter Medications as of 11/07/2020  Medication Sig   albuterol (VENTOLIN HFA) 108 (90 Base) MCG/ACT inhaler Inhale 2 puffs into the lungs every 6 (six) hours as needed for wheezing or shortness of breath. (Patient not taking: Reported on 10/29/2020)   amLODipine (NORVASC) 2.5 MG tablet Take 1 tablet (2.5 mg total) by mouth daily.   aspirin EC 81 MG tablet Take 1 tablet by mouth daily. (Patient not taking: Reported on 10/29/2020)   famotidine (PEPCID) 20 MG tablet Take 1 tablet (20 mg total) by mouth 2 (two) times daily for 7 days.   fluticasone (FLONASE) 50 MCG/ACT nasal spray Place 2 sprays into both nostrils daily. (Patient not taking: Reported on 10/29/2020)   hydrocortisone ointment 0.5 % Apply 1 application topically 2 (two) times daily. Prn only for eczema (Patient not taking: Reported on 10/29/2020)   levocetirizine (XYZAL) 5 MG tablet Take 1 tablet (5 mg total) by mouth every evening.   meloxicam (MOBIC) 15 MG tablet Take 1 tablet (15 mg total) by mouth daily.   methotrexate (RHEUMATREX) 2.5 MG tablet Take by mouth.   oxyCODONE (OXY IR/ROXICODONE) 5 MG immediate release tablet Take 1 tablet (5 mg total) by mouth 2 (two) times daily as needed for severe pain.   pantoprazole (PROTONIX) 40 MG tablet Take 1 tablet (40 mg total) by mouth daily.   rosuvastatin (CRESTOR) 20 MG tablet Take 1 tablet (20 mg total) by mouth at bedtime.   solifenacin (VESICARE) 5 MG tablet Take 5 mg by mouth daily.   tamsulosin (FLOMAX) 0.4 MG CAPS capsule Take 1 capsule (0.4 mg total) by mouth daily. (Patient not taking: Reported on 10/29/2020)   traMADol (ULTRAM) 50 MG tablet Take one tab po bid prn for pain   No facility-administered encounter medications on file as of 11/07/2020.   Reviewed the patients chart for ant medical/health and/or  medication changes there were not nay changes at this time. Reviewed the patients star rating drugs, his medication refills were up to date.   Follow-Up:Pharmacist Review  Charlann Lange, [place] Pharmacist Assistant [phone]

## 2020-11-09 DIAGNOSIS — E782 Mixed hyperlipidemia: Secondary | ICD-10-CM | POA: Diagnosis not present

## 2020-11-09 DIAGNOSIS — I10 Essential (primary) hypertension: Secondary | ICD-10-CM | POA: Diagnosis not present

## 2020-11-09 DIAGNOSIS — K219 Gastro-esophageal reflux disease without esophagitis: Secondary | ICD-10-CM | POA: Diagnosis not present

## 2020-11-20 ENCOUNTER — Encounter: Payer: Self-pay | Admitting: Nurse Practitioner

## 2020-11-20 ENCOUNTER — Ambulatory Visit (INDEPENDENT_AMBULATORY_CARE_PROVIDER_SITE_OTHER): Payer: Medicare HMO | Admitting: Nurse Practitioner

## 2020-11-20 ENCOUNTER — Other Ambulatory Visit: Payer: Self-pay

## 2020-11-20 VITALS — BP 140/72 | HR 77 | Temp 98.3°F | Resp 16 | Ht 70.0 in | Wt 206.4 lb

## 2020-11-20 DIAGNOSIS — E119 Type 2 diabetes mellitus without complications: Secondary | ICD-10-CM

## 2020-11-20 DIAGNOSIS — R300 Dysuria: Secondary | ICD-10-CM | POA: Diagnosis not present

## 2020-11-20 DIAGNOSIS — J309 Allergic rhinitis, unspecified: Secondary | ICD-10-CM

## 2020-11-20 DIAGNOSIS — R0602 Shortness of breath: Secondary | ICD-10-CM | POA: Diagnosis not present

## 2020-11-20 DIAGNOSIS — M150 Primary generalized (osteo)arthritis: Secondary | ICD-10-CM

## 2020-11-20 DIAGNOSIS — M0579 Rheumatoid arthritis with rheumatoid factor of multiple sites without organ or systems involvement: Secondary | ICD-10-CM | POA: Diagnosis not present

## 2020-11-20 DIAGNOSIS — N402 Nodular prostate without lower urinary tract symptoms: Secondary | ICD-10-CM | POA: Diagnosis not present

## 2020-11-20 DIAGNOSIS — R972 Elevated prostate specific antigen [PSA]: Secondary | ICD-10-CM | POA: Diagnosis not present

## 2020-11-20 DIAGNOSIS — C61 Malignant neoplasm of prostate: Secondary | ICD-10-CM | POA: Diagnosis not present

## 2020-11-20 LAB — POCT GLYCOSYLATED HEMOGLOBIN (HGB A1C): Hemoglobin A1C: 6.4 % — AB (ref 4.0–5.6)

## 2020-11-20 MED ORDER — ACCU-CHEK GUIDE VI STRP: ORAL_STRIP | 5 refills | Status: DC

## 2020-11-20 MED ORDER — ALBUTEROL SULFATE HFA 108 (90 BASE) MCG/ACT IN AERS: 2.0000 | INHALATION_SPRAY | Freq: Four times a day (QID) | RESPIRATORY_TRACT | 5 refills | Status: DC | PRN

## 2020-11-20 MED ORDER — SOLIFENACIN SUCCINATE 5 MG PO TABS: 5.0000 mg | ORAL_TABLET | Freq: Every day | ORAL | 1 refills | Status: DC

## 2020-11-20 MED ORDER — ACCU-CHEK SOFTCLIX LANCETS MISC: 5 refills | Status: DC

## 2020-11-20 MED ORDER — OXYCODONE HCL 5 MG PO TABS: 5.0000 mg | ORAL_TABLET | Freq: Two times a day (BID) | ORAL | 0 refills | Status: DC | PRN

## 2020-11-20 MED ORDER — METFORMIN HCL 500 MG PO TABS
500.0000 mg | ORAL_TABLET | Freq: Two times a day (BID) | ORAL | 1 refills | Status: DC
Start: 2020-11-20 — End: 2021-05-19

## 2020-11-20 MED ORDER — FLUTICASONE PROPIONATE 50 MCG/ACT NA SUSP: 2.0000 | Freq: Every day | NASAL | 5 refills | Status: DC

## 2020-11-20 NOTE — Progress Notes (Signed)
[facility] [place], [place] [address]  Internal MEDICINE  Office Visit Note  Patient Name: Russell Sullivan  [id number]  [id number]  Date of Service: 11/20/2020  Chief Complaint  Patient presents with   Results    Fup [place] results    HPI Russell Sullivan presents for follow up visit to discuss carotid ultrasound and echocardiogram results. He is also concerned about his A1C because it has been elevated in the past. His carotid ultrasound results showed less than 50% stenosis bilaterally. May repeat to monitor in 6-12 months if clinically significant.  -echo cardiogram results discussed: LVEF 68.92%, mild aortic root dilation, mild aortic regurgitation, slight right atrial  dilation, impaired relaxation during diastolic filling; consider diastolic dysfunction and repeat in 1 year for aortic root size.  -last A1C was 6.6; today A1C was 6.4. glucose meter provided to patient today, teaching done by Edd Arbour.    Current Medication: Outpatient Encounter Medications as of 11/20/2020  Medication Sig   amLODipine (NORVASC) 2.5 MG tablet Take 1 tablet (2.5 mg total) by mouth daily.   folic acid (FOLVITE) 1 MG tablet folic acid 1 mg tablet  TAKE 1 TABLET BY MOUTH ONCE DAILY   hydrocortisone ointment 0.5 % Apply 1 application topically 2 (two) times daily. Prn only for eczema   levocetirizine (XYZAL) 5 MG tablet Take 1 tablet (5 mg total) by mouth every evening.   meloxicam (MOBIC) 15 MG tablet Take 1 tablet (15 mg total) by mouth daily.   metFORMIN (GLUCOPHAGE) 500 MG tablet Take 1 tablet (500 mg total) by mouth 2 (two) times daily with a meal.   methotrexate (RHEUMATREX) 2.5 MG tablet Take by mouth.   pantoprazole (PROTONIX) 40 MG tablet Take 1 tablet (40 mg total) by mouth daily.   rosuvastatin (CRESTOR) 20 MG tablet Take 1 tablet (20 mg total) by mouth at bedtime.   traMADol (ULTRAM) 50 MG tablet Take one tab po bid prn for pain   [DISCONTINUED] albuterol (VENTOLIN HFA)  108 (90 Base) MCG/ACT inhaler Inhale 2 puffs into the lungs every 6 (six) hours as needed for wheezing or shortness of breath.   [DISCONTINUED] fluticasone (FLONASE) 50 MCG/ACT nasal spray Place 2 sprays into both nostrils daily.   [DISCONTINUED] oxyCODONE (OXY IR/ROXICODONE) 5 MG immediate release tablet Take 1 tablet (5 mg total) by mouth 2 (two) times daily as needed for severe pain.   [DISCONTINUED] solifenacin (VESICARE) 5 MG tablet Take 5 mg by mouth daily.   albuterol (VENTOLIN HFA) 108 (90 Base) MCG/ACT inhaler Inhale 2 puffs into the lungs every 6 (six) hours as needed for wheezing or shortness of breath.   famotidine (PEPCID) 20 MG tablet Take 1 tablet (20 mg total) by mouth 2 (two) times daily for 7 days.   fluticasone (FLONASE) 50 MCG/ACT nasal spray Place 2 sprays into both nostrils daily.   oxyCODONE (OXY IR/ROXICODONE) 5 MG immediate release tablet Take 1 tablet (5 mg total) by mouth 2 (two) times daily as needed for severe pain.   solifenacin (VESICARE) 5 MG tablet Take 1 tablet (5 mg total) by mouth daily.   [DISCONTINUED] aspirin EC 81 MG tablet Take 1 tablet by mouth daily. (Patient not taking: Reported on 10/29/2020)   [DISCONTINUED] tamsulosin (FLOMAX) 0.4 MG CAPS capsule Take 1 capsule (0.4 mg total) by mouth daily. (Patient not taking: No sig reported)   No facility-administered encounter medications on file as of 11/20/2020.    Surgical History: Past Surgical History:  Procedure Laterality  Date   HERNIA REPAIR  2009   INTUBATION-ENDOTRACHEAL WITH TRACHEOSTOMY STANDBY N/A 01/13/2018   Procedure: INTUBATION-ENDOTRACHEAL WITH TRACHEOSTOMY STANDBY;  Surgeon: Beverly Gust, MD;  Location: [facility] ORS;  Service: ENT;  Laterality: N/A;   left shoulder surgery      Medical History: Past Medical History:  Diagnosis Date   Adenocarcinoma of prostate ([place])    Hyperlipidemia    Hypertension     Family History: Family History  Problem Relation Age of Onset   Hypertension  Mother    Diabetes Mother    Hypertension Father    Diabetes Maternal Grandmother    Diabetes Paternal Grandmother     Social History   Socioeconomic History   Marital status: Married    Spouse name: Not on file   Number of children: Not on file   Years of education: Not on file   Highest education level: Not on file  Occupational History   Not on file  Tobacco Use   Smoking status: Former    Types: Cigarettes   Smokeless tobacco: Never   Tobacco comments:    20 year ago  Substance and Sexual Activity   Alcohol use: Yes    Comment: ocassionally   Drug use: No   Sexual activity: Not on file  Other Topics Concern   Not on file  Social History Narrative   Not on file   Social Determinants of Health   Financial Resource Strain: Not on file  Food Insecurity: Not on file  Transportation Needs: Not on file  Physical Activity: Not on file  Stress: Not on file  Social Connections: Not on file  Intimate Partner Violence: Not on file      Review of Systems  Constitutional:  Positive for fatigue. Negative for chills and unexpected weight change.  HENT: Negative.  Negative for congestion, rhinorrhea, sneezing and sore throat.   Eyes:  Negative for redness.  Respiratory: Negative.  Negative for cough, chest tightness, shortness of breath and wheezing.   Cardiovascular: Negative.  Negative for chest pain and palpitations.  Gastrointestinal:  Negative for abdominal pain, constipation, diarrhea, nausea and vomiting.  Genitourinary: Negative.  Negative for dysuria and frequency.  Musculoskeletal:  Negative for arthralgias, back pain, joint swelling and neck pain.  Skin:  Negative for rash.  Neurological: Negative.  Negative for dizziness, tremors, light-headedness, numbness and headaches.  Hematological:  Negative for adenopathy. Does not bruise/bleed easily.  Psychiatric/Behavioral: Negative.  Negative for behavioral problems (Depression), sleep disturbance and suicidal  ideas. The patient is not nervous/anxious.    Vital Signs: BP 140/72 Comment: 148/70  Pulse 77   Temp 98.3 F (36.8 C)   Resp 16   Ht '5\' 10"'$  (1.778 m)   Wt 206 lb 6.4 oz (93.6 kg)   SpO2 98%   BMI 29.62 kg/m    Physical Exam Vitals reviewed.  Constitutional:      General: He is not in acute distress.    Appearance: Normal appearance. He is not ill-appearing.  HENT:     Head: Normocephalic and atraumatic.  Cardiovascular:     Rate and Rhythm: Normal rate and regular rhythm.  Pulmonary:     Effort: Pulmonary effort is normal. No respiratory distress.  Skin:    General: Skin is warm and dry.     Capillary Refill: Capillary refill takes less than 2 seconds.  Neurological:     Mental Status: He is alert and oriented to person, place, and time.  Psychiatric:  Mood and Affect: Mood normal.        Behavior: Behavior normal.     Assessment/Plan: 1. Type 2 diabetes mellitus without complication, without long-term current use of insulin (HCC) A1C 6.4. start metformin 500 mg twice daily. Glucose meter and teaching provided to patient in office today. Patient instructed to check his glucose levels twice daily, before taking his metformin, patient instructed to hold metformin if glucose is less than 70.  - POCT glycosylated hemoglobin (Hb A1C) - metFORMIN (GLUCOPHAGE) 500 MG tablet; Take 1 tablet (500 mg total) by mouth 2 (two) times daily with a meal.  Dispense: 180 tablet; Refill: 1  2. Rheumatoid arthritis involving multiple sites with positive rheumatoid factor (HCC) Chronic problem, followed by rheumatology. Oxycodone refill ordered. Patient takes folic acid 1 mg daily to prevent adverse effects from methotrexate use. Refill ordered. [place] Controlled Substance Database was reviewed by me for overdose risk score (ORS). ORS is 210.  - oxyCODONE (OXY IR/ROXICODONE) 5 MG immediate release tablet; Take 1 tablet (5 mg total) by mouth 2 (two) times daily as needed for severe pain.   Dispense: 60 tablet; Refill: 0 - folic acid (FOLVITE) 1 MG tablet; folic acid 1 mg tablet  TAKE 1 TABLET BY MOUTH ONCE DAILY  3. Primary generalized (osteo)arthritis Refill ordered. ORS is 210 per PDMP.  - oxyCODONE (OXY IR/ROXICODONE) 5 MG immediate release tablet; Take 1 tablet (5 mg total) by mouth 2 (two) times daily as needed for severe pain.  Dispense: 60 tablet; Refill: 0  4. Allergic rhinitis, unspecified seasonality, unspecified trigger Stable, refill ordered - fluticasone (FLONASE) 50 MCG/ACT nasal spray; Place 2 sprays into both nostrils daily.  Dispense: 16 g; Refill: 5  5. SOB (shortness of breath) Refill ordered - albuterol (VENTOLIN HFA) 108 (90 Base) MCG/ACT inhaler; Inhale 2 puffs into the lungs every 6 (six) hours as needed for wheezing or shortness of breath.  Dispense: 18 g; Refill: 5  6. Dysuria Refill ordered - solifenacin (VESICARE) 5 MG tablet; Take 1 tablet (5 mg total) by mouth daily.  Dispense: 90 tablet; Refill: 1   General Counseling: [place] verbalizes understanding of the findings of todays visit and agrees with plan of treatment. I have discussed any further diagnostic evaluation that may be needed or ordered today. We also reviewed his medications today. he has been encouraged to call the office with any questions or concerns that should arise related to todays visit.    Orders Placed This Encounter  Procedures   POCT glycosylated hemoglobin (Hb A1C)    Meds ordered this encounter  Medications   solifenacin (VESICARE) 5 MG tablet    Sig: Take 1 tablet (5 mg total) by mouth daily.    Dispense:  90 tablet    Refill:  1   oxyCODONE (OXY IR/ROXICODONE) 5 MG immediate release tablet    Sig: Take 1 tablet (5 mg total) by mouth 2 (two) times daily as needed for severe pain.    Dispense:  60 tablet    Refill:  0   fluticasone (FLONASE) 50 MCG/ACT nasal spray    Sig: Place 2 sprays into both nostrils daily.    Dispense:  16 g    Refill:  5   albuterol  (VENTOLIN HFA) 108 (90 Base) MCG/ACT inhaler    Sig: Inhale 2 puffs into the lungs every 6 (six) hours as needed for wheezing or shortness of breath.    Dispense:  18 g    Refill:  5  metFORMIN (GLUCOPHAGE) 500 MG tablet    Sig: Take 1 tablet (500 mg total) by mouth 2 (two) times daily with a meal.    Dispense:  180 tablet    Refill:  1    Return in about 1 month (around 12/21/2020) for F/U diabetes, Tyliek Timberman PCP.   Total time spent:30 Minutes Time spent includes review of chart, medications, test results, and follow up plan with the patient.   [place] Controlled Substance Database was reviewed by me.  This patient was seen by [name], FNP-C in collaboration with Dr. Clayborn Bigness as a part of collaborative care agreement.   Hicks Feick R. Valetta Fuller, MSN, FNP-C Internal medicine

## 2020-11-20 NOTE — Progress Notes (Signed)
Pt given accu chek machine to check his blood sugars

## 2020-12-04 ENCOUNTER — Other Ambulatory Visit: Payer: Self-pay | Admitting: Internal Medicine

## 2020-12-04 DIAGNOSIS — C61 Malignant neoplasm of prostate: Secondary | ICD-10-CM | POA: Diagnosis not present

## 2020-12-08 ENCOUNTER — Other Ambulatory Visit: Payer: Self-pay | Admitting: Internal Medicine

## 2020-12-08 ENCOUNTER — Telehealth: Payer: Self-pay

## 2020-12-08 MED ORDER — TRAMADOL HCL 50 MG PO TABS: ORAL_TABLET | ORAL | 1 refills | Status: DC

## 2020-12-08 NOTE — Telephone Encounter (Signed)
error 

## 2020-12-19 ENCOUNTER — Other Ambulatory Visit: Payer: Self-pay

## 2020-12-19 ENCOUNTER — Ambulatory Visit (INDEPENDENT_AMBULATORY_CARE_PROVIDER_SITE_OTHER): Payer: Medicare HMO | Admitting: Nurse Practitioner

## 2020-12-19 ENCOUNTER — Encounter: Payer: Self-pay | Admitting: Nurse Practitioner

## 2020-12-19 VITALS — BP 138/68 | HR 78 | Temp 97.9°F | Resp 16 | Ht 70.0 in | Wt 208.2 lb

## 2020-12-19 DIAGNOSIS — E119 Type 2 diabetes mellitus without complications: Secondary | ICD-10-CM | POA: Diagnosis not present

## 2020-12-19 DIAGNOSIS — Z79899 Other long term (current) drug therapy: Secondary | ICD-10-CM

## 2020-12-19 DIAGNOSIS — M0579 Rheumatoid arthritis with rheumatoid factor of multiple sites without organ or systems involvement: Secondary | ICD-10-CM | POA: Diagnosis not present

## 2020-12-19 DIAGNOSIS — L089 Local infection of the skin and subcutaneous tissue, unspecified: Secondary | ICD-10-CM | POA: Diagnosis not present

## 2020-12-19 LAB — POCT URINE DRUG SCREEN
Methylenedioxyamphetamine: NOT DETECTED
POC Amphetamine UR: NOT DETECTED
POC BENZODIAZEPINES UR: NOT DETECTED
POC Barbiturate UR: NOT DETECTED
POC Cocaine UR: NOT DETECTED
POC Ecstasy UR: NOT DETECTED
POC Marijuana UR: NOT DETECTED
POC Methadone UR: NOT DETECTED
POC Methamphetamine UR: NOT DETECTED
POC Opiate Ur: NOT DETECTED
POC Oxycodone UR: POSITIVE — AB
POC PHENCYCLIDINE UR: NOT DETECTED
POC TRICYCLICS UR: NOT DETECTED

## 2020-12-19 MED ORDER — OXYCODONE HCL 5 MG PO TABS: 5.0000 mg | ORAL_TABLET | Freq: Two times a day (BID) | ORAL | 0 refills | Status: DC | PRN

## 2020-12-19 MED ORDER — MUPIROCIN 2 % EX OINT
1.0000 | TOPICAL_OINTMENT | Freq: Two times a day (BID) | CUTANEOUS | 0 refills | Status: AC
Start: 2020-12-19 — End: 2020-12-26

## 2020-12-19 NOTE — Progress Notes (Signed)
[facility] [place], [place] [address]  Internal MEDICINE  Office Visit Note  Patient Name: Russell Sullivan  [id number]  NN:[id number]  Date of Service: 12/19/2020  Chief Complaint  Patient presents with   Follow-up   Hyperlipidemia   Hypertension   Diabetes    HPI Russell Sullivan presents for follow up visit for diabetes. He has been checking his glucose levels at home and they have ranged 100-140 most of the time with 1 or 2 readings in the 150s or 160s. He is taking metformin 500 mg twice daily with mealsand is tolerating the medication well. He denies any adverse side effects. He reports that he has stopped drinking regular sodas and is decreasing how much bread he eats.  -He has some inflamed scratches on his right lower leg and right foot from working in the yard around [place]. He wants to know if he should put something on them.    Current Medication: Outpatient Encounter Medications as of 12/19/2020  Medication Sig   Accu-Chek Softclix Lancets lancets Use to check blood sugars twice a week E11.65   albuterol (VENTOLIN HFA) 108 (90 Base) MCG/ACT inhaler Inhale 2 puffs into the lungs every 6 (six) hours as needed for wheezing or shortness of breath.   amLODipine (NORVASC) 2.5 MG tablet Take 1 tablet (2.5 mg total) by mouth daily.   fluticasone (FLONASE) 50 MCG/ACT nasal spray Place 2 sprays into both nostrils daily.   folic acid (FOLVITE) 1 MG tablet folic acid 1 mg tablet  TAKE 1 TABLET BY MOUTH ONCE DAILY   glucose blood (ACCU-CHEK GUIDE) test strip check blood sugars twice a day E11.65   hydrocortisone ointment 0.5 % Apply 1 application topically 2 (two) times daily. Prn only for eczema   levocetirizine (XYZAL) 5 MG tablet Take 1 tablet (5 mg total) by mouth every evening.   meloxicam (MOBIC) 15 MG tablet Take 1 tablet (15 mg total) by mouth daily.   metFORMIN (GLUCOPHAGE) 500 MG tablet Take 1 tablet (500 mg total) by mouth 2 (two) times daily with a meal.    methotrexate (RHEUMATREX) 2.5 MG tablet Take by mouth.   mupirocin ointment (BACTROBAN) 2 % Apply 1 application topically 2 (two) times daily for 7 days.   pantoprazole (PROTONIX) 40 MG tablet Take 1 tablet (40 mg total) by mouth daily.   rosuvastatin (CRESTOR) 20 MG tablet Take 1 tablet (20 mg total) by mouth at bedtime.   solifenacin (VESICARE) 5 MG tablet Take 1 tablet (5 mg total) by mouth daily.   traMADol (ULTRAM) 50 MG tablet Take one tab po bid for pain   [DISCONTINUED] oxyCODONE (OXY IR/ROXICODONE) 5 MG immediate release tablet Take 1 tablet (5 mg total) by mouth 2 (two) times daily as needed for severe pain.   famotidine (PEPCID) 20 MG tablet Take 1 tablet (20 mg total) by mouth 2 (two) times daily for 7 days.   oxyCODONE (OXY IR/ROXICODONE) 5 MG immediate release tablet Take 1 tablet (5 mg total) by mouth 2 (two) times daily as needed for severe pain.   No facility-administered encounter medications on file as of 12/19/2020.    Surgical History: Past Surgical History:  Procedure Laterality Date   HERNIA REPAIR  2009   INTUBATION-ENDOTRACHEAL WITH TRACHEOSTOMY STANDBY N/A 01/13/2018   Procedure: INTUBATION-ENDOTRACHEAL WITH TRACHEOSTOMY STANDBY;  Surgeon: Beverly Gust, MD;  Location: [facility] ORS;  Service: ENT;  Laterality: N/A;   left shoulder surgery      Medical History: Past  Medical History:  Diagnosis Date   Adenocarcinoma of prostate ([place])    Diabetes mellitus without complication ([place])    Hyperlipidemia    Hypertension     Family History: Family History  Problem Relation Age of Onset   Hypertension Mother    Diabetes Mother    Hypertension Father    Diabetes Maternal Grandmother    Diabetes Paternal Grandmother     Social History   Socioeconomic History   Marital status: Married    Spouse name: Not on file   Number of children: Not on file   Years of education: Not on file   Highest education level: Not on file  Occupational History   Not on file   Tobacco Use   Smoking status: Former    Types: Cigarettes   Smokeless tobacco: Never   Tobacco comments:    20 year ago  Substance and Sexual Activity   Alcohol use: Yes    Comment: ocassionally   Drug use: No   Sexual activity: Not on file  Other Topics Concern   Not on file  Social History Narrative   Not on file   Social Determinants of Health   Financial Resource Strain: Not on file  Food Insecurity: Not on file  Transportation Needs: Not on file  Physical Activity: Not on file  Stress: Not on file  Social Connections: Not on file  Intimate Partner Violence: Not on file      Review of Systems  Constitutional:  Negative for chills, fatigue and unexpected weight change.  HENT:  Negative for congestion, rhinorrhea, sneezing and sore throat.   Eyes:  Negative for redness.  Respiratory:  Negative for cough, chest tightness and shortness of breath.   Cardiovascular:  Negative for chest pain and palpitations.  Gastrointestinal:  Negative for abdominal pain, constipation, diarrhea, nausea and vomiting.  Genitourinary:  Negative for dysuria and frequency.  Musculoskeletal:  Negative for arthralgias, back pain, joint swelling and neck pain.  Skin:  Negative for rash.  Neurological: Negative.  Negative for tremors and numbness.  Hematological:  Negative for adenopathy. Does not bruise/bleed easily.  Psychiatric/Behavioral:  Negative for behavioral problems (Depression), sleep disturbance and suicidal ideas. The patient is not nervous/anxious.    Vital Signs: BP 138/68   Pulse 78   Temp 97.9 F (36.6 C)   Resp 16   Ht '5\' 10"'$  (1.778 m)   Wt 208 lb 3.2 oz (94.4 kg)   SpO2 95%   BMI 29.87 kg/m    Physical Exam Vitals reviewed.  Constitutional:      General: He is not in acute distress.    Appearance: Normal appearance. He is obese. He is not ill-appearing.  HENT:     Head: Normocephalic and atraumatic.  Eyes:     Extraocular Movements: Extraocular movements  intact.     Pupils: Pupils are equal, round, and reactive to light.  Cardiovascular:     Rate and Rhythm: Normal rate and regular rhythm.  Pulmonary:     Effort: Pulmonary effort is normal. No respiratory distress.  Skin:    Findings: Wound present.     Comments: Small superficial scratches and abrasions on the right anterior lower leg, ankle and on the dorsal area of the right foot. The edges of the wounds are erythematous. No swelling or purulent drainage noted.   Neurological:     Mental Status: He is alert and oriented to person, place, and time.     Cranial Nerves: No cranial nerve  deficit.     Gait: Gait normal.  Psychiatric:        Mood and Affect: Mood normal.        Behavior: Behavior normal.     Assessment/Plan: 1. Type 2 diabetes mellitus without complication, without long-term current use of insulin (HCC) Doing well checking glucose levels, taking medication as prescribed and endorses compliance with diet and lifestyle modifications. Repeat A1C in 2 months.   2. Abrasion, leg with infection, right, initial encounter Antibiotic ointment prescribed to treat or prevent possible developing infection.  - mupirocin ointment (BACTROBAN) 2 %; Apply 1 application topically 2 (two) times daily for 7 days.  Dispense: 22 g; Refill: 0  3. Rheumatoid arthritis involving multiple sites with positive rheumatoid factor (HCC) Oxycodone refill ordered. [place] Controlled Substance Database was reviewed by me for overdose risk score (ORS). ORS is 200.  Reviewed risks and possible side effects associated with taking opiates, benzodiazepines and other CNS depressants. Combination of these could cause dizziness and drowsiness. Advised patient not to drive or operate machinery when taking these medications, as patient's and other's life can be at risk and will have consequences. Patient verbalized understanding in this matter. Dependence and abuse for these drugs will be monitored closely. A Controlled  substance policy and procedure is on file which allows [facility] to order a urine drug screen test at any visit. Patient understands and agrees with the plan. - oxyCODONE (OXY IR/ROXICODONE) 5 MG immediate release tablet; Take 1 tablet (5 mg total) by mouth 2 (two) times daily as needed for severe pain.  Dispense: 60 tablet; Refill: 0  4. Encounter for long-term (current) use of medications UDS done, result is consistent with oxycodone prescription. - POCT Urine Drug Screen   General Counseling: Russell Sullivan verbalizes understanding of the findings of todays visit and agrees with plan of treatment. I have discussed any further diagnostic evaluation that may be needed or ordered today. We also reviewed his medications today. he has been encouraged to call the office with any questions or concerns that should arise related to todays visit.    Orders Placed This Encounter  Procedures   POCT Urine Drug Screen    Meds ordered this encounter  Medications   oxyCODONE (OXY IR/ROXICODONE) 5 MG immediate release tablet    Sig: Take 1 tablet (5 mg total) by mouth 2 (two) times daily as needed for severe pain.    Dispense:  60 tablet    Refill:  0   mupirocin ointment (BACTROBAN) 2 %    Sig: Apply 1 application topically 2 (two) times daily for 7 days.    Dispense:  22 g    Refill:  0    Return in about 2 months (around 02/18/2021) for F/U, Recheck A1C, Jareb Radoncic PCP.   Total time spent:20 Minutes Time spent includes review of chart, medications, test results, and follow up plan with the patient.   [place] Controlled Substance Database was reviewed by me. ORS 200.  This patient was seen by [name], FNP-C in collaboration with Dr. Clayborn Bigness as a part of collaborative care agreement.   Billie Trager R. Valetta Fuller, MSN, FNP-C Internal medicine

## 2021-01-12 ENCOUNTER — Telehealth: Payer: Self-pay | Admitting: Pharmacist

## 2021-01-12 NOTE — Progress Notes (Signed)
    Chronic Care Management Pharmacy Assistant   Name: Russell Sullivan  MRN: [id number] DOB: 10/25/1950  Reason for Encounter: General Disease State Call   Conditions to be addressed/monitored: HTN, GERD,HLD.  Recent office visits:  12/19/20 [name], NP. For follow-up.STARTED Mupirocin 2% 1 application topical 2 times daily.  11/20/20 [name], NP. For follow-up. STARTED Metformin 500 mg 2 times daily with meals  Recent consult visits:  11/20/20 Oncology Telemedicine Alroy Dust, MD. No medication changes.   Hospital visits:  None since 11/07/20  Medications: Outpatient Encounter Medications as of 01/12/2021  Medication Sig   Accu-Chek Softclix Lancets lancets Use to check blood sugars twice a week E11.65   albuterol (VENTOLIN HFA) 108 (90 Base) MCG/ACT inhaler Inhale 2 puffs into the lungs every 6 (six) hours as needed for wheezing or shortness of breath.   amLODipine (NORVASC) 2.5 MG tablet Take 1 tablet (2.5 mg total) by mouth daily.   famotidine (PEPCID) 20 MG tablet Take 1 tablet (20 mg total) by mouth 2 (two) times daily for 7 days.   fluticasone (FLONASE) 50 MCG/ACT nasal spray Place 2 sprays into both nostrils daily.   folic acid (FOLVITE) 1 MG tablet folic acid 1 mg tablet  TAKE 1 TABLET BY MOUTH ONCE DAILY   glucose blood (ACCU-CHEK GUIDE) test strip check blood sugars twice a day E11.65   hydrocortisone ointment 0.5 % Apply 1 application topically 2 (two) times daily. Prn only for eczema   levocetirizine (XYZAL) 5 MG tablet Take 1 tablet (5 mg total) by mouth every evening.   meloxicam (MOBIC) 15 MG tablet Take 1 tablet (15 mg total) by mouth daily.   metFORMIN (GLUCOPHAGE) 500 MG tablet Take 1 tablet (500 mg total) by mouth 2 (two) times daily with a meal.   methotrexate (RHEUMATREX) 2.5 MG tablet Take by mouth.   oxyCODONE (OXY IR/ROXICODONE) 5 MG immediate release tablet Take 1 tablet (5 mg total) by mouth 2 (two) times daily as needed for severe  pain.   pantoprazole (PROTONIX) 40 MG tablet Take 1 tablet (40 mg total) by mouth daily.   rosuvastatin (CRESTOR) 20 MG tablet Take 1 tablet (20 mg total) by mouth at bedtime.   solifenacin (VESICARE) 5 MG tablet Take 1 tablet (5 mg total) by mouth daily.   traMADol (ULTRAM) 50 MG tablet Take one tab po bid for pain   No facility-administered encounter medications on file as of 01/12/2021.   GEN CALL: Called patient to get an update on his eating habits. He stated he has been doing pretty good. He stated he is cutting back on his sweets and drinking more water. He is scheduled to get his A1C re checked next month on 02/20/21. Patient stated he does not have any questions or concerns about his medications at this time.   Care Gaps:None.  Star Rating Drugs:Metformin 500 mg 11/20/20 90 DS, Rosuvastatin 20 mg 10/07/20 90 DS.   Follow-Up:Pharmacist Review  Charlann Lange, [place] Pharmacist Assistant [phone]

## 2021-01-15 ENCOUNTER — Telehealth: Payer: Self-pay

## 2021-01-16 ENCOUNTER — Other Ambulatory Visit: Payer: Self-pay | Admitting: Nurse Practitioner

## 2021-01-16 DIAGNOSIS — M0579 Rheumatoid arthritis with rheumatoid factor of multiple sites without organ or systems involvement: Secondary | ICD-10-CM

## 2021-01-16 MED ORDER — OXYCODONE HCL 5 MG PO TABS: 5.0000 mg | ORAL_TABLET | Freq: Two times a day (BID) | ORAL | 0 refills | Status: DC | PRN

## 2021-01-18 NOTE — Telephone Encounter (Signed)
error 

## 2021-01-26 ENCOUNTER — Other Ambulatory Visit: Payer: Self-pay

## 2021-01-26 ENCOUNTER — Encounter: Payer: Self-pay | Admitting: Physician Assistant

## 2021-01-26 ENCOUNTER — Ambulatory Visit (INDEPENDENT_AMBULATORY_CARE_PROVIDER_SITE_OTHER): Payer: Medicare HMO | Admitting: Physician Assistant

## 2021-01-26 DIAGNOSIS — E782 Mixed hyperlipidemia: Secondary | ICD-10-CM

## 2021-01-26 DIAGNOSIS — R300 Dysuria: Secondary | ICD-10-CM | POA: Diagnosis not present

## 2021-01-26 DIAGNOSIS — M0579 Rheumatoid arthritis with rheumatoid factor of multiple sites without organ or systems involvement: Secondary | ICD-10-CM

## 2021-01-26 DIAGNOSIS — R5383 Other fatigue: Secondary | ICD-10-CM

## 2021-01-26 DIAGNOSIS — I10 Essential (primary) hypertension: Secondary | ICD-10-CM | POA: Diagnosis not present

## 2021-01-26 DIAGNOSIS — Z23 Encounter for immunization: Secondary | ICD-10-CM | POA: Diagnosis not present

## 2021-01-26 DIAGNOSIS — C61 Malignant neoplasm of prostate: Secondary | ICD-10-CM

## 2021-01-26 DIAGNOSIS — E119 Type 2 diabetes mellitus without complications: Secondary | ICD-10-CM | POA: Diagnosis not present

## 2021-01-26 DIAGNOSIS — Z0001 Encounter for general adult medical examination with abnormal findings: Secondary | ICD-10-CM | POA: Diagnosis not present

## 2021-01-26 LAB — POCT GLYCOSYLATED HEMOGLOBIN (HGB A1C): Hemoglobin A1C: 5.9 % — AB (ref 4.0–5.6)

## 2021-01-26 LAB — POCT UA - MICROALBUMIN
Albumin/Creatinine Ratio, Urine, POC: 10
Creatinine, POC: 10 mg/dL
Microalbumin Ur, POC: <30 mg/L

## 2021-01-26 NOTE — Progress Notes (Signed)
[facility] [place], [place] [address]  Internal MEDICINE  Office Visit Note  Patient Name: Russell Sullivan  [id number]  [id number]  Date of Service: 01/27/2021  Chief Complaint  Patient presents with   Medicare Wellness   Rheumatoid Arthritis   Diabetes     HPI Pt is here for routine health maintenance examination -Last saw oncology for prostate cancer in August, never heard any follow up after xray. He is going to contact their office, but would like to recheck a PSA level today with the rest of his blood work -Taking 6 tabs of methotrexate once per week. Continues to take tramadol and oxycodone to help manage his pain. -A1c 5.9 today and he states he cut out sodas and sugary drinks. Discussed decreasing metformin to once per day but patient states he feels more comfortable staying where is since his numbers went up the last time he tried to decrease. Will continue to monitor and reassess next visit. He admits he takes metofrmin every morning, but sometimes forgets evening dose already -Sleep is ok -Some poison ivy on arms from yard work last weekend, and taking allergy pills has helped. Not very itchy currently and is improving. -due for routine fasting labs -Cologuard done last year  Current Medication: Outpatient Encounter Medications as of 01/26/2021  Medication Sig   Accu-Chek Softclix Lancets lancets Use to check blood sugars twice a week E11.65   albuterol (VENTOLIN HFA) 108 (90 Base) MCG/ACT inhaler Inhale 2 puffs into the lungs every 6 (six) hours as needed for wheezing or shortness of breath.   amLODipine (NORVASC) 2.5 MG tablet Take 1 tablet (2.5 mg total) by mouth daily.   fluticasone (FLONASE) 50 MCG/ACT nasal spray Place 2 sprays into both nostrils daily.   folic acid (FOLVITE) 1 MG tablet folic acid 1 mg tablet  TAKE 1 TABLET BY MOUTH ONCE DAILY   glucose blood (ACCU-CHEK GUIDE) test strip check blood sugars twice a day E11.65    hydrocortisone ointment 0.5 % Apply 1 application topically 2 (two) times daily. Prn only for eczema   levocetirizine (XYZAL) 5 MG tablet Take 1 tablet (5 mg total) by mouth every evening.   meloxicam (MOBIC) 15 MG tablet Take 1 tablet (15 mg total) by mouth daily.   metFORMIN (GLUCOPHAGE) 500 MG tablet Take 1 tablet (500 mg total) by mouth 2 (two) times daily with a meal.   methotrexate (RHEUMATREX) 2.5 MG tablet Take by mouth.   oxyCODONE (OXY IR/ROXICODONE) 5 MG immediate release tablet Take 1 tablet (5 mg total) by mouth 2 (two) times daily as needed for severe pain.   pantoprazole (PROTONIX) 40 MG tablet Take 1 tablet (40 mg total) by mouth daily.   rosuvastatin (CRESTOR) 20 MG tablet Take 1 tablet (20 mg total) by mouth at bedtime.   solifenacin (VESICARE) 5 MG tablet Take 1 tablet (5 mg total) by mouth daily.   traMADol (ULTRAM) 50 MG tablet Take one tab po bid for pain   famotidine (PEPCID) 20 MG tablet Take 1 tablet (20 mg total) by mouth 2 (two) times daily for 7 days.   No facility-administered encounter medications on file as of 01/26/2021.    Surgical History: Past Surgical History:  Procedure Laterality Date   HERNIA REPAIR  2009   INTUBATION-ENDOTRACHEAL WITH TRACHEOSTOMY STANDBY N/A 01/13/2018   Procedure: INTUBATION-ENDOTRACHEAL WITH TRACHEOSTOMY STANDBY;  Surgeon: Beverly Gust, MD;  Location: [facility] ORS;  Service: ENT;  Laterality: N/A;   left shoulder surgery  Medical History: Past Medical History:  Diagnosis Date   Adenocarcinoma of prostate ([place])    Diabetes mellitus without complication ([place])    Hyperlipidemia    Hypertension     Family History: Family History  Problem Relation Age of Onset   Hypertension Mother    Diabetes Mother    Hypertension Father    Diabetes Maternal Grandmother    Diabetes Paternal Grandmother       Review of Systems  Constitutional:  Negative for chills, fatigue and unexpected weight change.  HENT:  Negative for  congestion, rhinorrhea, sneezing and sore throat.   Eyes:  Negative for redness.  Respiratory:  Negative for cough, chest tightness and shortness of breath.   Cardiovascular:  Negative for chest pain and palpitations.  Gastrointestinal:  Negative for abdominal pain, constipation, diarrhea, nausea and vomiting.  Genitourinary:  Negative for dysuria and frequency.  Musculoskeletal:  Positive for arthralgias and back pain. Negative for joint swelling and neck pain.  Skin:  Positive for rash.       Recovering from posion ivy on arms  Neurological: Negative.  Negative for tremors and numbness.  Hematological:  Negative for adenopathy. Does not bruise/bleed easily.  Psychiatric/Behavioral:  Negative for behavioral problems (Depression), sleep disturbance and suicidal ideas. The patient is not nervous/anxious.     Vital Signs: BP 140/78   Pulse 100   Temp 98.6 F (37 C)   Resp 16   Ht 5\' 10"  (1.778 m)   Wt 210 lb (95.3 kg)   SpO2 96%   BMI 30.13 kg/m    Physical Exam Vitals and nursing note reviewed.  Constitutional:      General: He is not in acute distress.    Appearance: He is well-developed. He is obese. He is not diaphoretic.  HENT:     Head: Normocephalic and atraumatic.     Right Ear: External ear normal.     Left Ear: External ear normal.     Nose: Nose normal.     Mouth/Throat:     Pharynx: No oropharyngeal exudate.  Eyes:     General: No scleral icterus.       Right eye: No discharge.        Left eye: No discharge.     Conjunctiva/sclera: Conjunctivae normal.     Pupils: Pupils are equal, round, and reactive to light.  Neck:     Thyroid: No thyromegaly.     Vascular: No JVD.     Trachea: No tracheal deviation.  Cardiovascular:     Rate and Rhythm: Normal rate and regular rhythm.     Heart sounds: Normal heart sounds. No murmur heard.   No friction rub. No gallop.  Pulmonary:     Effort: Pulmonary effort is normal. No respiratory distress.     Breath sounds:  Normal breath sounds. No stridor. No wheezing or rales.  Chest:     Chest wall: No tenderness.  Abdominal:     General: Bowel sounds are normal. There is no distension.     Palpations: Abdomen is soft. There is no mass.     Tenderness: There is no abdominal tenderness. There is no guarding or rebound.  Musculoskeletal:        General: No tenderness or deformity. Normal range of motion.     Cervical back: Normal range of motion and neck supple.  Lymphadenopathy:     Cervical: No cervical adenopathy.  Skin:    General: Skin is warm and dry.  Coloration: Skin is not pale.     Findings: Rash present. No erythema.     Comments: Healing rash on arms  Neurological:     Mental Status: He is alert.     Cranial Nerves: No cranial nerve deficit.     Motor: No abnormal muscle tone.     Coordination: Coordination normal.     Deep Tendon Reflexes: Reflexes are normal and symmetric.  Psychiatric:        Behavior: Behavior normal.        Thought Content: Thought content normal.        Judgment: Judgment normal.     LABS: Recent Results (from the past 2160 hour(s))  POCT glycosylated hemoglobin (Hb A1C)     Status: Abnormal   Collection Time: 11/20/20  9:16 AM  Result Value Ref Range   Hemoglobin A1C 6.4 (A) 4.0 - 5.6 %   HbA1c POC (<> result, manual entry)     HbA1c, POC (prediabetic range)     HbA1c, POC (controlled diabetic range)    POCT Urine Drug Screen     Status: Abnormal   Collection Time: 12/19/20  9:16 AM  Result Value Ref Range   POC Methamphetamine UR None Detected None Detected   POC Opiate Ur None Detected None Detected   POC Barbiturate UR None Detected None Detected   POC Amphetamine UR None Detected None Detected   POC Oxycodone UR Positive (A) None Detected   POC Cocaine UR None Detected None Detected   POC Ecstasy UR None Detected None Detected   POC TRICYCLICS UR None Detected None Detected   POC PHENCYCLIDINE UR None Detected None Detected   POC Marijuana UR  None Detected None Detected   POC Methadone UR None Detected None Detected   POC BENZODIAZEPINES UR None Detected None Detected   URINE TEMPERATURE     POC DRUG SCREEN OXIDANTS URINE     POC SPECIFIC GRAVITY URINE     POC PH URINE     Methylenedioxyamphetamine None Detected None Detected  UA/M w/rflx Culture, Routine     Status: None   Collection Time: 01/26/21  8:51 AM   Specimen: Urine   Urine  Result Value Ref Range   Specific Gravity, UA 1.007 1.005 - 1.030   pH, UA 6.5 5.0 - 7.5   Color, UA Yellow Yellow   Appearance Ur Clear Clear   Leukocytes,UA Negative Negative   Protein,UA Negative Negative/Trace   Glucose, UA Negative Negative   Ketones, UA Negative Negative   RBC, UA Negative Negative   Bilirubin, UA Negative Negative   Urobilinogen, Ur 0.2 0.2 - 1.0 mg/dL   Nitrite, UA Negative Negative   Microscopic Examination Comment     Comment: Microscopic follows if indicated.   Microscopic Examination See below:     Comment: Microscopic was indicated and was performed.   Urinalysis Reflex Comment     Comment: This specimen will not reflex to a Urine Culture.  Microscopic Examination     Status: None   Collection Time: 01/26/21  8:51 AM   Urine  Result Value Ref Range   WBC, UA None seen 0 - 5 /hpf   RBC None seen 0 - 2 /hpf   Epithelial Cells (non renal) None seen 0 - 10 /hpf   Casts None seen None seen /lpf   Bacteria, UA None seen None seen/Few  POCT glycosylated hemoglobin (Hb A1C)     Status: Abnormal   Collection Time: 01/26/21  9:13  AM  Result Value Ref Range   Hemoglobin A1C 5.9 (A) 4.0 - 5.6 %   HbA1c POC (<> result, manual entry)     HbA1c, POC (prediabetic range)     HbA1c, POC (controlled diabetic range)    POCT UA - Microalbumin     Status: None   Collection Time: 01/26/21  9:18 AM  Result Value Ref Range   Microalbumin Ur, POC <30 mg/L   Creatinine, POC 10 mg/dL   Albumin/Creatinine Ratio, Urine, POC 10         Assessment/Plan: 1. Encounter  for general adult medical examination with abnormal findings CPE performed, up-to-date on colon cancer screening, routine fasting labs ordered  2. Type 2 diabetes mellitus without complication, without long-term current use of insulin (HCC) - POCT glycosylated hemoglobin (Hb A1C) is 5.9 which is improved from last visit.  Patient is hesitant to decrease metformin from twice daily to once a day.  We will continue current medications and monitoring and reassess at next visit - POCT UA - Microalbumin  3. Essential hypertension Stable, continue current medications  4. Rheumatoid arthritis involving multiple sites with positive rheumatoid factor (HCC) Stable, continue current medications  5. Need for influenza vaccination - Flu Vaccine MDCK QUAD PF  6. Malignant neoplasm of prostate (HCC) - PSA Total (Reflex To Free)  7. Mixed hyperlipidemia Continue crestor and will update labs - Lipid Panel With LDL/HDL Ratio  8. Other fatigue - CBC w/Diff/Platelet - Comprehensive metabolic panel - TSH + free T4  9. Dysuria - UA/M w/rflx Culture, Routine - Microscopic Examination   General Counseling: Ferry verbalizes understanding of the findings of todays visit and agrees with plan of treatment. I have discussed any further diagnostic evaluation that may be needed or ordered today. We also reviewed his medications today. he has been encouraged to call the office with any questions or concerns that should arise related to todays visit.    Counseling:    Orders Placed This Encounter  Procedures   Microscopic Examination   Flu Vaccine MDCK QUAD PF   UA/M w/rflx Culture, Routine   CBC w/Diff/Platelet   Comprehensive metabolic panel   TSH + free T4   Lipid Panel With LDL/HDL Ratio   PSA Total (Reflex To Free)   POCT glycosylated hemoglobin (Hb A1C)   POCT UA - Microalbumin    No orders of the defined types were placed in this encounter.   This patient was seen by Drema Dallas,  PA-C in collaboration with Dr. Clayborn Bigness as a part of collaborative care agreement.  Total time spent:30 Minutes  Time spent includes review of chart, medications, test results, and follow up plan with the patient.     Lavera Guise, MD  Internal Medicine

## 2021-01-27 LAB — UA/M W/RFLX CULTURE, ROUTINE
Bilirubin, UA: NEGATIVE
Glucose, UA: NEGATIVE
Ketones, UA: NEGATIVE
Leukocytes,UA: NEGATIVE
Nitrite, UA: NEGATIVE
Protein,UA: NEGATIVE
RBC, UA: NEGATIVE
Specific Gravity, UA: 1.007 (ref 1.005–1.030)
Urobilinogen, Ur: 0.2 mg/dL (ref 0.2–1.0)
pH, UA: 6.5 (ref 5.0–7.5)

## 2021-01-27 LAB — MICROSCOPIC EXAMINATION
Bacteria, UA: NONE SEEN
Casts: NONE SEEN /lpf
Epithelial Cells (non renal): NONE SEEN /hpf (ref 0–10)
RBC, Urine: NONE SEEN /hpf (ref 0–2)
WBC, UA: NONE SEEN /hpf (ref 0–5)

## 2021-01-30 DIAGNOSIS — R5383 Other fatigue: Secondary | ICD-10-CM | POA: Diagnosis not present

## 2021-01-30 DIAGNOSIS — C61 Malignant neoplasm of prostate: Secondary | ICD-10-CM | POA: Diagnosis not present

## 2021-01-30 DIAGNOSIS — E782 Mixed hyperlipidemia: Secondary | ICD-10-CM | POA: Diagnosis not present

## 2021-01-31 LAB — CBC WITH DIFFERENTIAL/PLATELET
Basophils Absolute: 0.1 10*3/uL (ref 0.0–0.2)
Basos: 1 %
EOS (ABSOLUTE): 0.1 10*3/uL (ref 0.0–0.4)
Eos: 2 %
Hematocrit: 42.8 % (ref 37.5–51.0)
Hemoglobin: 14.8 g/dL (ref 13.0–17.7)
Immature Grans (Abs): 0 10*3/uL (ref 0.0–0.1)
Immature Granulocytes: 0 %
Lymphocytes Absolute: 2.9 10*3/uL (ref 0.7–3.1)
Lymphs: 46 %
MCH: 32.2 pg (ref 26.6–33.0)
MCHC: 34.6 g/dL (ref 31.5–35.7)
MCV: 93 fL (ref 79–97)
Monocytes Absolute: 0.5 10*3/uL (ref 0.1–0.9)
Monocytes: 9 %
Neutrophils Absolute: 2.6 10*3/uL (ref 1.4–7.0)
Neutrophils: 42 %
Platelets: 246 10*3/uL (ref 150–450)
RBC: 4.6 x10E6/uL (ref 4.14–5.80)
RDW: 13.5 % (ref 11.6–15.4)
WBC: 6.3 10*3/uL (ref 3.4–10.8)

## 2021-01-31 LAB — TSH+FREE T4
Free T4: 1.27 ng/dL (ref 0.82–1.77)
TSH: 1.05 u[IU]/mL (ref 0.450–4.500)

## 2021-01-31 LAB — FPSA% REFLEX
% FREE PSA: 14.4 %
PSA, FREE: 1.43 ng/mL

## 2021-01-31 LAB — PSA TOTAL (REFLEX TO FREE): Prostate Specific Ag, Serum: 9.9 ng/mL — ABNORMAL HIGH (ref 0.0–4.0)

## 2021-01-31 LAB — COMPREHENSIVE METABOLIC PANEL
ALT: 24 IU/L (ref 0–44)
AST: 26 IU/L (ref 0–40)
Albumin/Globulin Ratio: 1.9 (ref 1.2–2.2)
Albumin: 4.5 g/dL (ref 3.7–4.7)
Alkaline Phosphatase: 115 IU/L (ref 44–121)
BUN/Creatinine Ratio: 17 (ref 10–24)
BUN: 16 mg/dL (ref 8–27)
Bilirubin Total: 0.8 mg/dL (ref 0.0–1.2)
CO2: 24 mmol/L (ref 20–29)
Calcium: 9.1 mg/dL (ref 8.6–10.2)
Chloride: 99 mmol/L (ref 96–106)
Creatinine, Ser: 0.92 mg/dL (ref 0.76–1.27)
Globulin, Total: 2.4 g/dL (ref 1.5–4.5)
Glucose: 121 mg/dL — ABNORMAL HIGH (ref 70–99)
Potassium: 4.5 mmol/L (ref 3.5–5.2)
Sodium: 138 mmol/L (ref 134–144)
Total Protein: 6.9 g/dL (ref 6.0–8.5)
eGFR: 89 mL/min/{1.73_m2} (ref >59)

## 2021-01-31 LAB — LIPID PANEL WITH LDL/HDL RATIO
Cholesterol, Total: 117 mg/dL (ref 100–199)
HDL: 52 mg/dL (ref >39)
LDL Chol Calc (NIH): 45 mg/dL (ref 0–99)
LDL/HDL Ratio: 0.9 ratio (ref 0.0–3.6)
Triglycerides: 110 mg/dL (ref 0–149)
VLDL Cholesterol Cal: 20 mg/dL (ref 5–40)

## 2021-02-09 DIAGNOSIS — K219 Gastro-esophageal reflux disease without esophagitis: Secondary | ICD-10-CM | POA: Diagnosis not present

## 2021-02-09 DIAGNOSIS — E782 Mixed hyperlipidemia: Secondary | ICD-10-CM | POA: Diagnosis not present

## 2021-02-09 DIAGNOSIS — I10 Essential (primary) hypertension: Secondary | ICD-10-CM | POA: Diagnosis not present

## 2021-02-12 DIAGNOSIS — Z79899 Other long term (current) drug therapy: Secondary | ICD-10-CM | POA: Diagnosis not present

## 2021-02-12 DIAGNOSIS — M19042 Primary osteoarthritis, left hand: Secondary | ICD-10-CM | POA: Diagnosis not present

## 2021-02-12 DIAGNOSIS — M19041 Primary osteoarthritis, right hand: Secondary | ICD-10-CM | POA: Diagnosis not present

## 2021-02-12 DIAGNOSIS — M05742 Rheumatoid arthritis with rheumatoid factor of left hand without organ or systems involvement: Secondary | ICD-10-CM | POA: Diagnosis not present

## 2021-02-12 DIAGNOSIS — M05741 Rheumatoid arthritis with rheumatoid factor of right hand without organ or systems involvement: Secondary | ICD-10-CM | POA: Diagnosis not present

## 2021-02-16 ENCOUNTER — Other Ambulatory Visit: Payer: Self-pay | Admitting: Physician Assistant

## 2021-02-16 ENCOUNTER — Telehealth: Payer: Self-pay

## 2021-02-16 DIAGNOSIS — M0579 Rheumatoid arthritis with rheumatoid factor of multiple sites without organ or systems involvement: Secondary | ICD-10-CM

## 2021-02-16 MED ORDER — OXYCODONE HCL 5 MG PO TABS: 5.0000 mg | ORAL_TABLET | Freq: Two times a day (BID) | ORAL | 0 refills | Status: DC | PRN

## 2021-02-16 NOTE — Telephone Encounter (Signed)
error 

## 2021-02-20 ENCOUNTER — Encounter: Payer: Self-pay | Admitting: Nurse Practitioner

## 2021-02-20 ENCOUNTER — Ambulatory Visit (INDEPENDENT_AMBULATORY_CARE_PROVIDER_SITE_OTHER): Payer: Medicare HMO | Admitting: Nurse Practitioner

## 2021-02-20 ENCOUNTER — Other Ambulatory Visit: Payer: Self-pay

## 2021-02-20 VITALS — BP 139/70 | HR 71 | Temp 98.3°F | Resp 16 | Ht 70.0 in | Wt 213.0 lb

## 2021-02-20 DIAGNOSIS — M0579 Rheumatoid arthritis with rheumatoid factor of multiple sites without organ or systems involvement: Secondary | ICD-10-CM

## 2021-02-20 DIAGNOSIS — E119 Type 2 diabetes mellitus without complications: Secondary | ICD-10-CM

## 2021-02-20 DIAGNOSIS — I10 Essential (primary) hypertension: Secondary | ICD-10-CM | POA: Diagnosis not present

## 2021-02-20 DIAGNOSIS — Z23 Encounter for immunization: Secondary | ICD-10-CM | POA: Diagnosis not present

## 2021-02-20 MED ORDER — PNEUMOCOCCAL 20-VAL CONJ VACC 0.5 ML IM SUSY: 0.5000 mL | PREFILLED_SYRINGE | INTRAMUSCULAR | 0 refills | Status: AC

## 2021-02-20 NOTE — Progress Notes (Signed)
[facility] [place], [place] [address]  Internal MEDICINE  Office Visit Note  Patient Name: Russell Sullivan  [id number]  [id number]  Date of Service: 02/20/2021  Chief Complaint  Patient presents with   Follow-up   Diabetes   Hyperlipidemia   Hypertension    HPI Chee presents for a follow up visit for diabetes and hypertension. He has his A1C checked a few weeks ago and it was stable at 5.9. He continues to take 500mg  of metformin once daily. His blood pressure also remains stable with current medications. He continues to see Dr. Ephriam Jenkins for rheumatoid arthritis and is currently taking methotrexate 15 mg weekly.    Current Medication: Outpatient Encounter Medications as of 02/20/2021  Medication Sig   Accu-Chek Softclix Lancets lancets Use to check blood sugars twice a week E11.65   albuterol (VENTOLIN HFA) 108 (90 Base) MCG/ACT inhaler Inhale 2 puffs into the lungs every 6 (six) hours as needed for wheezing or shortness of breath.   amLODipine (NORVASC) 2.5 MG tablet Take 1 tablet (2.5 mg total) by mouth daily.   fluticasone (FLONASE) 50 MCG/ACT nasal spray Place 2 sprays into both nostrils daily.   folic acid (FOLVITE) 1 MG tablet folic acid 1 mg tablet  TAKE 1 TABLET BY MOUTH ONCE DAILY   glucose blood (ACCU-CHEK GUIDE) test strip check blood sugars twice a day E11.65   hydrocortisone ointment 0.5 % Apply 1 application topically 2 (two) times daily. Prn only for eczema   levocetirizine (XYZAL) 5 MG tablet Take 1 tablet (5 mg total) by mouth every evening.   meloxicam (MOBIC) 15 MG tablet Take 1 tablet (15 mg total) by mouth daily.   metFORMIN (GLUCOPHAGE) 500 MG tablet Take 1 tablet (500 mg total) by mouth 2 (two) times daily with a meal.   methotrexate (RHEUMATREX) 2.5 MG tablet Take by mouth.   oxyCODONE (OXY IR/ROXICODONE) 5 MG immediate release tablet Take 1 tablet (5 mg total) by mouth 2 (two) times daily as needed for severe pain.    pantoprazole (PROTONIX) 40 MG tablet Take 1 tablet (40 mg total) by mouth daily.   rosuvastatin (CRESTOR) 20 MG tablet Take 1 tablet (20 mg total) by mouth at bedtime.   solifenacin (VESICARE) 5 MG tablet Take 1 tablet (5 mg total) by mouth daily.   traMADol (ULTRAM) 50 MG tablet Take one tab po bid for pain   [DISCONTINUED] pneumococcal 20-valent conjugate vaccine (PREVNAR 20) 0.5 ML injection Inject 0.5 mLs into the muscle tomorrow at 10 am.   aspirin 81 MG EC tablet Take by mouth.   famotidine (PEPCID) 20 MG tablet Take 1 tablet (20 mg total) by mouth 2 (two) times daily for 7 days.   pneumococcal 20-valent conjugate vaccine (PREVNAR 20) 0.5 ML injection Inject 0.5 mLs into the muscle tomorrow at 10 am for 1 dose.   No facility-administered encounter medications on file as of 02/20/2021.    Surgical History: Past Surgical History:  Procedure Laterality Date   HERNIA REPAIR  2009   INTUBATION-ENDOTRACHEAL WITH TRACHEOSTOMY STANDBY N/A 01/13/2018   Procedure: INTUBATION-ENDOTRACHEAL WITH TRACHEOSTOMY STANDBY;  Surgeon: Beverly Gust, MD;  Location: [facility] ORS;  Service: ENT;  Laterality: N/A;   left shoulder surgery      Medical History: Past Medical History:  Diagnosis Date   Adenocarcinoma of prostate ([place])    Diabetes mellitus without complication ([place])    Hyperlipidemia    Hypertension     Family History: Family History  Problem Relation Age of Onset   Hypertension Mother    Diabetes Mother    Hypertension Father    Diabetes Maternal Grandmother    Diabetes Paternal Grandmother     Social History   Socioeconomic History   Marital status: Married    Spouse name: Not on file   Number of children: Not on file   Years of education: Not on file   Highest education level: Not on file  Occupational History   Not on file  Tobacco Use   Smoking status: Former    Types: Cigarettes   Smokeless tobacco: Never   Tobacco comments:    20 year ago  Substance and Sexual  Activity   Alcohol use: Yes    Comment: ocassionally   Drug use: No   Sexual activity: Not on file  Other Topics Concern   Not on file  Social History Narrative   Not on file   Social Determinants of Health   Financial Resource Strain: Not on file  Food Insecurity: Not on file  Transportation Needs: Not on file  Physical Activity: Not on file  Stress: Not on file  Social Connections: Not on file  Intimate Partner Violence: Not on file      Review of Systems  Constitutional:  Negative for chills, fatigue and unexpected weight change.  HENT:  Negative for congestion, rhinorrhea, sneezing and sore throat.   Eyes:  Negative for redness.  Respiratory:  Negative for cough, chest tightness and shortness of breath.   Cardiovascular:  Negative for chest pain and palpitations.  Gastrointestinal:  Negative for abdominal pain, constipation, diarrhea, nausea and vomiting.  Genitourinary:  Negative for dysuria and frequency.  Musculoskeletal:  Negative for arthralgias, back pain, joint swelling and neck pain.  Skin:  Negative for rash.  Neurological: Negative.  Negative for tremors and numbness.  Hematological:  Negative for adenopathy. Does not bruise/bleed easily.  Psychiatric/Behavioral:  Negative for behavioral problems (Depression), sleep disturbance and suicidal ideas. The patient is not nervous/anxious.    Vital Signs: BP 139/70   Pulse 71   Temp 98.3 F (36.8 C)   Resp 16   Ht 5\' 10"  (1.778 m)   Wt 213 lb (96.6 kg)   SpO2 98%   BMI 30.56 kg/m    Physical Exam Vitals reviewed.  Constitutional:      General: He is not in acute distress.    Appearance: Normal appearance. He is obese. He is not ill-appearing.  HENT:     Head: Normocephalic and atraumatic.  Eyes:     Pupils: Pupils are equal, round, and reactive to light.  Cardiovascular:     Rate and Rhythm: Normal rate and regular rhythm.  Pulmonary:     Effort: No respiratory distress.  Neurological:     Mental  Status: He is alert and oriented to person, place, and time.     Cranial Nerves: No cranial nerve deficit.     Coordination: Coordination normal.     Gait: Gait normal.  Psychiatric:        Mood and Affect: Mood normal.        Behavior: Behavior normal.       Assessment/Plan: 1. Type 2 diabetes mellitus without complication, without long-term current use of insulin (HCC) A1C 5.9, repeat A1C in 6 months. Continue metformin as prescribed.   2. Essential hypertension Stable with current medication, continue as prescribed.   3. Rheumatoid arthritis involving multiple sites with positive rheumatoid factor ([place]) Follow by Dr.  Mayur Posey Pronto, rheumatology.   4. Need for vaccination - pneumococcal 20-valent conjugate vaccine (PREVNAR 20) 0.5 ML injection; Inject 0.5 mLs into the muscle tomorrow at 10 am for 1 dose.  Dispense: 0.5 mL; Refill: 0   General Counseling: Dmari verbalizes understanding of the findings of todays visit and agrees with plan of treatment. I have discussed any further diagnostic evaluation that may be needed or ordered today. We also reviewed his medications today. he has been encouraged to call the office with any questions or concerns that should arise related to todays visit.    No orders of the defined types were placed in this encounter.   Meds ordered this encounter  Medications   pneumococcal 20-valent conjugate vaccine (PREVNAR 20) 0.5 ML injection    Sig: Inject 0.5 mLs into the muscle tomorrow at 10 am for 1 dose.    Dispense:  0.5 mL    Refill:  0    Return in about 6 months (around 08/20/2021) for F/U, Recheck A1C, Braylea Brancato PCP.   Total time spent:15 Minutes Time spent includes review of chart, medications, test results, and follow up plan with the patient.   [place] Controlled Substance Database was reviewed by me.  This patient was seen by [name], FNP-C in collaboration with Dr. Clayborn Bigness as a part of collaborative care  agreement.   Kamree Wiens R. Valetta Fuller, MSN, FNP-C Internal medicine

## 2021-03-13 ENCOUNTER — Other Ambulatory Visit: Payer: Self-pay | Admitting: Internal Medicine

## 2021-03-13 DIAGNOSIS — K219 Gastro-esophageal reflux disease without esophagitis: Secondary | ICD-10-CM

## 2021-03-15 NOTE — Telephone Encounter (Signed)
Med sent.

## 2021-03-16 ENCOUNTER — Telehealth: Payer: Self-pay

## 2021-03-17 ENCOUNTER — Other Ambulatory Visit: Payer: Self-pay | Admitting: Nurse Practitioner

## 2021-03-17 DIAGNOSIS — M0579 Rheumatoid arthritis with rheumatoid factor of multiple sites without organ or systems involvement: Secondary | ICD-10-CM

## 2021-03-17 MED ORDER — OXYCODONE HCL 5 MG PO TABS: 5.0000 mg | ORAL_TABLET | Freq: Two times a day (BID) | ORAL | 0 refills | Status: DC | PRN

## 2021-03-19 NOTE — Telephone Encounter (Signed)
Refill sent.

## 2021-03-24 DIAGNOSIS — C61 Malignant neoplasm of prostate: Secondary | ICD-10-CM | POA: Diagnosis not present

## 2021-04-07 ENCOUNTER — Other Ambulatory Visit: Payer: Self-pay | Admitting: Nurse Practitioner

## 2021-04-09 ENCOUNTER — Other Ambulatory Visit: Payer: Self-pay | Admitting: Nurse Practitioner

## 2021-04-11 ENCOUNTER — Other Ambulatory Visit: Payer: Self-pay | Admitting: Nurse Practitioner

## 2021-04-11 NOTE — Telephone Encounter (Signed)
Med sent to pharmacy.

## 2021-04-16 ENCOUNTER — Telehealth: Payer: Self-pay

## 2021-04-16 DIAGNOSIS — I10 Essential (primary) hypertension: Secondary | ICD-10-CM

## 2021-04-16 MED ORDER — AMLODIPINE BESYLATE 2.5 MG PO TABS: 2.5000 mg | ORAL_TABLET | Freq: Every day | ORAL | 3 refills | Status: DC

## 2021-04-18 ENCOUNTER — Other Ambulatory Visit: Payer: Self-pay | Admitting: Internal Medicine

## 2021-04-18 ENCOUNTER — Telehealth: Payer: Self-pay

## 2021-04-18 DIAGNOSIS — M0579 Rheumatoid arthritis with rheumatoid factor of multiple sites without organ or systems involvement: Secondary | ICD-10-CM

## 2021-04-18 MED ORDER — OXYCODONE HCL 5 MG PO TABS: 5.0000 mg | ORAL_TABLET | Freq: Two times a day (BID) | ORAL | 0 refills | Status: DC | PRN

## 2021-04-20 NOTE — Telephone Encounter (Signed)
error 

## 2021-04-21 NOTE — Telephone Encounter (Signed)
Refill was sent in 

## 2021-05-06 ENCOUNTER — Telehealth: Payer: Self-pay

## 2021-05-06 ENCOUNTER — Ambulatory Visit: Payer: Medicare HMO | Admitting: Student-PharmD

## 2021-05-06 ENCOUNTER — Other Ambulatory Visit: Payer: Self-pay

## 2021-05-06 DIAGNOSIS — I10 Essential (primary) hypertension: Secondary | ICD-10-CM

## 2021-05-06 DIAGNOSIS — E782 Mixed hyperlipidemia: Secondary | ICD-10-CM

## 2021-05-06 DIAGNOSIS — R972 Elevated prostate specific antigen [PSA]: Secondary | ICD-10-CM

## 2021-05-06 NOTE — Telephone Encounter (Signed)
Pt called that he like to add labs for PSA advised him that he need to call his urologist he said they not going to order he like check advised we will send order but still need to follow up with urologist and follow there instruction for treatment

## 2021-05-06 NOTE — Progress Notes (Signed)
Follow Up Pharmacist Visit   Russell Sullivan, Russell Sullivan [id number] 85 years, Male  DOB: [birth year]  M: [phone]  Clinical Summary Situation: Patient presents via telephone for f/u CCM visit Background: Patient's primary concern is his prostate and getting that examined Assessment:Patient's BP and cholesterol are well controlled. Recommendations: -Instructed patient to call office and discuss getting an appt for PSA/prostate checks -Continue current medication therapy -Check home BP once to twice weekly  Patient Chart Prep  Completed by Charlann Lange on 05/04/2021  Chronic Conditions Patient's Chronic Conditions: Hypertension (HTN), Gastroesophageal Reflux Disease (GERD), Osteoarthritis, Hyperlipidemia/Dyslipidemia (HLD)  Doctor and Hospital Visits Were there PCP Visits since last visit with the Pharmacist?: Yes Visit #1: 01/26/21 Mylinda Latina, PA-C. For Medicare wellness. No medication changes. Visit #2: 02/09/21 Devona Konig A. For Hypertension. No other information given. Visit #3: 02/20/21 [name], NP. For type 2 diabetes mellitus. No medication changes. Were there Specialist Visits since last visit with the Pharmacist?: Yes Visit #1: 02/12/21 Rheumatology. Posey Pronto, Mayur K. For Rheumatoid arthritis. No medication changes. Visit #2: 03/24/2021 Oncology Tommie Sams, MD. For Prostate cancer. No medication changes. Was there a Hospital Visit in last 30 days?: No Were there other Hospital Visits since last visit with the Pharmacist?: No  Medication Information Have there been any medication changes from PCP or Specialist since last visit with the Pharmacist?: No Are there any Medication adherence gaps (beyond 5 days past due)?: No Medication adherence rates for the STAR rating drugs: Metformin 500 mg 02/14/21 90 DS Rosuvastatin 20 mg 03/30/21 90 DS List Patient's current Care Gaps: No current Care Gaps identified  Pre-Call Questions  Are you able to  connect with Patient?: Yes Visit Type: Face to Face Have you been in the hospital or emergency room recently?: No Do you have any questions or concerns that you want to make sure your pharmacist addresses with your during your appointment?: No What, if any, problems do you have getting your medications from the pharmacy?: None Is there anything else that you would like me to pass along to your pharmacist or PCP?: Yes Details: Patient stated he wants to come in on wed at 2:00 PM because he needs to pick up a order for a lab test for a PSA lab, he stated if you could tell the doctor in advance. Patient reminded to bring medication bottles to visit (not a list of meds) OR have medication bottles pulled out prior to appointment time: Done  Disease Assessments  Subjective Information Current BP: 147/76 Current HR: 74 taken on: 03/24/2021 Weight: 208 BMI: 29.96 Last GFR: 89 taken on: 01/29/2021 Why did the patient present?: CCM F/U Visit Is Patient using [facility]?: No Name and location of Current pharmacy: [facility] Current Rx insurance plan: [employer] Are meds synced by current pharmacy?: Yes Are meds delivered by current pharmacy?: No - delivery available but patient prefers to not use Would patient benefit from direct intervention of clinical lead in dispensing process to optimize clinical outcomes?: Yes Are [facility] pharmacy services available where patient lives?: Yes Is patient disadvantaged to use [facility]?: Yes Does patient experience delays in picking up medications due to transportation concerns (getting to pharmacy)?: No Any additional demeanor/mood notes?: Patient is pleasant to speak with and his primary concern is his prostate currently.  Hypertension (HTN) Assess this condition today?: Yes Is patient able to obtain BP reading today?: No Goal: <130/80 mmHG Hypertension Stage: Stage 1 (SBP: 130-139 or DBP: 80-89) Is Patient checking BP at home?:  Yes Patient  home BP readings are ranging: infrequent checking, no readings to report How often does patient miss taking their blood pressure medications?: None Has patient experienced hypotension, dizziness, falls or bradycardia?: No Does Patient use RPM device?: No BP RPM device: Does patient qualify?: No We discussed: DASH diet:  following a diet emphasizing fruits and vegetables and low-fat dairy products along with whole grains, fish, poultry, and nuts. Reducing red meats and sugars., Targeting 150 minutes of aerobic activity per week, Reducing the amount of salt intake to 1500mg /per day., Proper Home BP Measurement Assessment:: Controlled Drug: Amlodipine 2.5mg  1 tablet daily Assessment: Appropriate, Effective, Safe, Accessible Additional Info: Recommended patient check blood pressure at home once to twice weekly Plan to (other): Continue medication therapy HC Follow up: 3 months Pharmacist Follow up: 6 months  Hyperlipidemia/Dyslipidemia (HLD) Last Lipid panel on: 01/29/2021 TC (Goal<200): 117 LDL: 45 HDL (Goal>40): 52 TG (Goal<150): 110 ASCVD 10-year risk?is:: High (>20%) ASCVD Risk Score: 21.6 Assess this condition today?: Yes LDL Goal: <70 Has patient tried and failed any HLD Medications?: No We discussed: How to reduce cholesterol through diet/weight management and physical activity., How a diet high in plant sterols (fruits/vegetables/nuts/whole grains/legumes) may reduce your cholesterol. Assessment:: Controlled Drug: Rosuvastatin 20mg  1 tablet daily Assessment: Appropriate, Effective, Safe, Accessible Plan to (other): Continue current medication therapy HC Follow up: 3 months Pharmacist Follow up: 6 months  Exercise, Diet and Non-Drug Coordination Needs Additional exercise counseling points. We discussed: targeting at least 151 minutes per week of moderate-intensity aerobic exercise. Additional diet counseling points. We discussed: key components of the DASH diet, aiming to  consume at least 8 cups of water day Discussed Non-Drug Care Coordination Needs: Yes Does Patient have Medication financial barriers?: No  Accountable Health Communities Health-Related Social Needs Screening Tool -  SDOH  What is your living situation today? (ref #1): I have a steady place to live Think about the place you live. Do you have problems with any of the following? (ref #2): None of the above Within the past 12 months, you worried that your food would run out before you got money to buy more (ref #3): Never true Within the past 12 months, the food you bought just didn't last and you didn't have money to get more (ref #4): Never true In the past 12 months, has lack of reliable transportation kept you from medical appointments, meetings, work or from getting things needed for daily living? (ref #5): No In the past 12 months, has the electric, gas, oil, or water company threatened to shut off services in your home? (ref #6): No How often does anyone, including family and friends, physically hurt you? (ref #7): Never (1) How often does anyone, including family and friends, insult or talk down to you? (ref #8): Never (1) How often does anyone, including friends and family, threaten you with harm? (ref #9): Never (1) How often does anyone, including family and friends, scream or curse at you? (ref #10): Never (1)  How to Take Your Blood Pressure Blood pressure measures how strongly your blood is pressing against the walls of your arteries. Arteries are blood vessels that carry blood from your heart throughout your body. You can take your blood pressure at home with a machine. You may need to check your blood pressure at home: To check if you have high blood pressure (hypertension). To check your blood pressure over time. To make sure your blood pressure medicine is working. Supplies needed: Blood pressure machine,  or monitor. Dining room chair to sit in. Table or desk. Small  notebook. Pencil or pen. How to prepare Avoid these things for 30 minutes before checking your blood pressure: Having drinks with caffeine in them, such as coffee or tea. Drinking alcohol. Eating. Smoking. Exercising. Do these things five minutes before checking your blood pressure: Go to the bathroom and pee (urinate). Sit in a dining chair. Do not sit on a soft couch or an armchair. Be quiet. Do not talk. How to take your blood pressure Follow the instructions that came with your machine. If you have a digital blood pressure monitor, these may be the instructions: Sit up straight. Place your feet on the floor. Do not cross your ankles or legs. Rest your left arm at the level of your heart. You may rest it on a table, desk, or chair. Pull up your shirt sleeve. Wrap the blood pressure cuff around the upper part of your left arm. The cuff should be 1 inch (2.5 cm) above your elbow. It is best to wrap the cuff around bare skin. Fit the cuff snugly around your arm. You should be able to place only one finger between the cuff and your arm. Place the cord so that it rests in the bend of your elbow. Press the power button. Sit quietly while the cuff fills with air and loses air. Write down the numbers on the screen. Wait 2-3 minutes and then repeat steps 1-10. What do the numbers mean? Two numbers make up your blood pressure. The first number is called systolic pressure. The second is called diastolic pressure. An example of a blood pressure reading is "120 over 80" (or 120/80). If you are an adult and do not have a medical condition, use this guide to find out if your blood pressure is normal: Normal First number: below 120. Second number: below 80. Elevated First number: 120-129. Second number: below 80. Hypertension stage 1 First number: 130-139. Second number: 80-89. Hypertension stage 2 First number: 140 or above. Second number: 21 or above. Your blood pressure is above normal  even if only the first or only the second number is above normal. Follow these instructions at home: Medicines Take over-the-counter and prescription medicines only as told by your doctor. Tell your doctor if your medicine is causing side effects. General instructions Check your blood pressure as often as your doctor tells you to. Check your blood pressure at the same time every day. Take your monitor to your next doctor's appointment. Your doctor will: Make sure you are using it correctly. Make sure it is working right. Understand what your blood pressure numbers should be. Keep all follow-up visits as told by your doctor. This is important. General tips You will need a blood pressure machine, or monitor. Your doctor can suggest a monitor. You can buy one at a drugstore or online. When choosing one: Choose one with an arm cuff. Choose one that wraps around your upper arm. Only one finger should fit between your arm and the cuff. Do not choose one that measures your blood pressure from your wrist or finger. Where to find more information American Heart Association: www.heart.org Contact a doctor if: Your blood pressure keeps being high. Your blood pressure is suddenly low. Get help right away if: Your first blood pressure number is higher than 180. Your second blood pressure number is higher than 120. Summary Check your blood pressure at the same time every day. Avoid caffeine, alcohol, smoking, and exercise  for 30 minutes before checking your blood pressure. Make sure you understand what your blood pressure numbers should be. This information is not intended to replace advice given to you by your health care provider. Make sure you discuss any questions you have with your health care provider.     Alena Bills Clinical Pharmacist [phone]

## 2021-05-07 LAB — PSA TOTAL (REFLEX TO FREE): Prostate Specific Ag, Serum: 13.1 ng/mL — ABNORMAL HIGH (ref 0.0–4.0)

## 2021-05-07 NOTE — Progress Notes (Signed)
His PSA has increased more to 13.1, please tell patient to follow up with his urologist.

## 2021-05-08 ENCOUNTER — Telehealth: Payer: Self-pay

## 2021-05-08 NOTE — Telephone Encounter (Signed)
-----   Message from [name], NP sent at 05/07/2021  8:34 AM EST ----- His PSA has increased more to 13.1, please tell patient to follow up with his urologist.

## 2021-05-10 DIAGNOSIS — I10 Essential (primary) hypertension: Secondary | ICD-10-CM | POA: Diagnosis not present

## 2021-05-10 DIAGNOSIS — K219 Gastro-esophageal reflux disease without esophagitis: Secondary | ICD-10-CM | POA: Diagnosis not present

## 2021-05-10 DIAGNOSIS — E782 Mixed hyperlipidemia: Secondary | ICD-10-CM | POA: Diagnosis not present

## 2021-05-11 DIAGNOSIS — C61 Malignant neoplasm of prostate: Secondary | ICD-10-CM | POA: Diagnosis not present

## 2021-05-12 ENCOUNTER — Other Ambulatory Visit: Payer: Self-pay | Admitting: Nurse Practitioner

## 2021-05-12 NOTE — Telephone Encounter (Signed)
Last 1/23 and next 5/23

## 2021-05-14 NOTE — Telephone Encounter (Signed)
Med sent to pharmacy.

## 2021-05-18 ENCOUNTER — Telehealth: Payer: Self-pay

## 2021-05-18 NOTE — Telephone Encounter (Signed)
Pt called to request refill for his pain medication.  Tried to call pt back to schedule appt since last visit was 02/20/21 no VM so I called wife and she will have him call back to schedule.

## 2021-05-19 ENCOUNTER — Ambulatory Visit (INDEPENDENT_AMBULATORY_CARE_PROVIDER_SITE_OTHER): Payer: Medicare HMO | Admitting: Nurse Practitioner

## 2021-05-19 ENCOUNTER — Other Ambulatory Visit: Payer: Self-pay

## 2021-05-19 ENCOUNTER — Encounter: Payer: Self-pay | Admitting: Nurse Practitioner

## 2021-05-19 VITALS — BP 160/90 | HR 80 | Temp 98.4°F | Resp 16 | Ht 71.0 in | Wt 213.4 lb

## 2021-05-19 DIAGNOSIS — E119 Type 2 diabetes mellitus without complications: Secondary | ICD-10-CM | POA: Diagnosis not present

## 2021-05-19 DIAGNOSIS — K219 Gastro-esophageal reflux disease without esophagitis: Secondary | ICD-10-CM | POA: Diagnosis not present

## 2021-05-19 DIAGNOSIS — I10 Essential (primary) hypertension: Secondary | ICD-10-CM | POA: Diagnosis not present

## 2021-05-19 DIAGNOSIS — M0579 Rheumatoid arthritis with rheumatoid factor of multiple sites without organ or systems involvement: Secondary | ICD-10-CM | POA: Diagnosis not present

## 2021-05-19 LAB — POCT GLYCOSYLATED HEMOGLOBIN (HGB A1C): Hemoglobin A1C: 6.1 % — AB (ref 4.0–5.6)

## 2021-05-19 MED ORDER — PANTOPRAZOLE SODIUM 40 MG PO TBEC: 80.0000 mg | DELAYED_RELEASE_TABLET | Freq: Every day | ORAL | 3 refills | Status: DC

## 2021-05-19 MED ORDER — OXYCODONE HCL 5 MG PO TABS: 5.0000 mg | ORAL_TABLET | Freq: Two times a day (BID) | ORAL | 0 refills | Status: DC | PRN

## 2021-05-19 MED ORDER — TRAMADOL HCL 50 MG PO TABS: 50.0000 mg | ORAL_TABLET | Freq: Two times a day (BID) | ORAL | 2 refills | Status: DC

## 2021-05-19 NOTE — Progress Notes (Signed)
[facility] [place], [place] [address]  Internal MEDICINE  Office Visit Note  Patient Name: Russell Sullivan  [id number]  [id number]  Date of Service: 05/19/2021  Chief Complaint  Patient presents with   Follow-up   Medication Refill   Diabetes   Hyperlipidemia   Hypertension    HPI Russell Sullivan presents for a follow-up visit for hypertension, diabetes and medication refills.  He has been seen at [facility] for prostate cancer and will be starting radiation soon. Patient reports that he stopped taking metformin.  His current A1c is 6.1 which is stable He reports that he is in a lot of pain and his blood pressure is elevated and still elevated when rechecked see vitals.  He has rheumatoid arthritis so he is constantly having chronic pain of multiple joints.    Current Medication: Outpatient Encounter Medications as of 05/19/2021  Medication Sig   Accu-Chek Softclix Lancets lancets Use to check blood sugars twice a week E11.65   albuterol (VENTOLIN HFA) 108 (90 Base) MCG/ACT inhaler Inhale 2 puffs into the lungs every 6 (six) hours as needed for wheezing or shortness of breath.   amLODipine (NORVASC) 2.5 MG tablet Take 1 tablet (2.5 mg total) by mouth daily.   aspirin 81 MG EC tablet Take by mouth.   fluticasone (FLONASE) 50 MCG/ACT nasal spray Place 2 sprays into both nostrils daily.   folic acid (FOLVITE) 1 MG tablet folic acid 1 mg tablet  TAKE 1 TABLET BY MOUTH ONCE DAILY   glucose blood (ACCU-CHEK GUIDE) test strip check blood sugars twice a day E11.65   hydrocortisone ointment 0.5 % Apply 1 application topically 2 (two) times daily. Prn only for eczema   levocetirizine (XYZAL) 5 MG tablet Take 1 tablet (5 mg total) by mouth every evening.   meloxicam (MOBIC) 15 MG tablet Take 1 tablet (15 mg total) by mouth daily.   methotrexate (RHEUMATREX) 2.5 MG tablet Take by mouth.   rosuvastatin (CRESTOR) 20 MG tablet Take 1 tablet (20 mg total) by mouth at bedtime.    solifenacin (VESICARE) 5 MG tablet Take 1 tablet (5 mg total) by mouth daily.   [DISCONTINUED] metFORMIN (GLUCOPHAGE) 500 MG tablet Take 1 tablet (500 mg total) by mouth 2 (two) times daily with a meal.   [DISCONTINUED] oxyCODONE (OXY IR/ROXICODONE) 5 MG immediate release tablet Take 1 tablet (5 mg total) by mouth 2 (two) times daily as needed for severe pain.   [DISCONTINUED] pantoprazole (PROTONIX) 40 MG tablet Take 1 tablet by mouth once daily   [DISCONTINUED] traMADol (ULTRAM) 50 MG tablet TAKE 1 TABLET BY MOUTH TWICE DAILY FOR PAIN   famotidine (PEPCID) 20 MG tablet Take 1 tablet (20 mg total) by mouth 2 (two) times daily for 7 days.   oxyCODONE (OXY IR/ROXICODONE) 5 MG immediate release tablet Take 1 tablet (5 mg total) by mouth 2 (two) times daily as needed for severe pain.   [START ON 06/16/2021] oxyCODONE (OXY IR/ROXICODONE) 5 MG immediate release tablet Take 1 tablet (5 mg total) by mouth 2 (two) times daily as needed for severe pain.   [START ON 06/16/2021] oxyCODONE (OXY IR/ROXICODONE) 5 MG immediate release tablet Take 1 tablet (5 mg total) by mouth 2 (two) times daily as needed for severe pain.   pantoprazole (PROTONIX) 40 MG tablet Take 2 tablets (80 mg total) by mouth daily.   traMADol (ULTRAM) 50 MG tablet Take 1 tablet (50 mg total) by mouth 2 (two) times daily.   No  facility-administered encounter medications on file as of 05/19/2021.    Surgical History: Past Surgical History:  Procedure Laterality Date   HERNIA REPAIR  2009   INTUBATION-ENDOTRACHEAL WITH TRACHEOSTOMY STANDBY N/A 01/13/2018   Procedure: INTUBATION-ENDOTRACHEAL WITH TRACHEOSTOMY STANDBY;  Surgeon: Beverly Gust, MD;  Location: [facility] ORS;  Service: ENT;  Laterality: N/A;   left shoulder surgery      Medical History: Past Medical History:  Diagnosis Date   Adenocarcinoma of prostate ([place])    Diabetes mellitus without complication ([place])    Hyperlipidemia    Hypertension     Family History: Family History   Problem Relation Age of Onset   Hypertension Mother    Diabetes Mother    Hypertension Father    Diabetes Maternal Grandmother    Diabetes Paternal Grandmother     Social History   Socioeconomic History   Marital status: Married    Spouse name: Not on file   Number of children: Not on file   Years of education: Not on file   Highest education level: Not on file  Occupational History   Not on file  Tobacco Use   Smoking status: Former    Types: Cigarettes   Smokeless tobacco: Never   Tobacco comments:    20 year ago  Substance and Sexual Activity   Alcohol use: Yes    Comment: ocassionally   Drug use: No   Sexual activity: Not on file  Other Topics Concern   Not on file  Social History Narrative   Not on file   Social Determinants of Health   Financial Resource Strain: Not on file  Food Insecurity: Not on file  Transportation Needs: Not on file  Physical Activity: Not on file  Stress: Not on file  Social Connections: Not on file  Intimate Partner Violence: Not on file      Review of Systems  Constitutional:  Negative for chills, fatigue and unexpected weight change.  HENT:  Negative for congestion, rhinorrhea, sneezing and sore throat.   Eyes:  Negative for redness.  Respiratory:  Negative for cough, chest tightness and shortness of breath.   Cardiovascular:  Negative for chest pain and palpitations.  Gastrointestinal:  Negative for abdominal pain, constipation, diarrhea, nausea and vomiting.  Genitourinary:  Negative for dysuria and frequency.  Musculoskeletal:  Negative for arthralgias, back pain, joint swelling and neck pain.  Skin:  Negative for rash.  Neurological: Negative.  Negative for tremors and numbness.  Hematological:  Negative for adenopathy. Does not bruise/bleed easily.  Psychiatric/Behavioral:  Negative for behavioral problems (Depression), sleep disturbance and suicidal ideas. The patient is not nervous/anxious.    Vital Signs: BP (!)  160/90 Comment: 170/80   Pulse 80    Temp 98.4 F (36.9 C)    Resp 16    Ht 5\' 11"  (1.803 m)    Wt 213 lb 6.4 oz (96.8 kg)    SpO2 99%    BMI 29.76 kg/m    Physical Exam Vitals reviewed.  Constitutional:      Appearance: Normal appearance. He is obese.  HENT:     Head: Normocephalic and atraumatic.  Eyes:     Pupils: Pupils are equal, round, and reactive to light.  Cardiovascular:     Rate and Rhythm: Normal rate and regular rhythm.  Pulmonary:     Effort: Pulmonary effort is normal. No respiratory distress.  Neurological:     Mental Status: He is alert and oriented to person, place, and time.  Psychiatric:  Mood and Affect: Mood normal.        Behavior: Behavior normal.       Assessment/Plan: 1. Type 2 diabetes mellitus without complication, without long-term current use of insulin (HCC) Stable, A1c is 6.1.  Patient stopped taking metformin.  Can stay off of metformin for now.  Repeat A1c in 3 months.  If his A1c worsens, will consider restarting metformin or other medication. - POCT HgB A1C  2. Gastro-esophageal reflux disease without esophagitis Symptoms are well controlled with pantoprazole, refills ordered. - pantoprazole (PROTONIX) 40 MG tablet; Take 2 tablets (80 mg total) by mouth daily.  Dispense: 60 tablet; Refill: 3  3. Rheumatoid arthritis involving multiple sites with positive rheumatoid factor ([place]) Patient takes oxycodone and tramadol chronically, 3 months of refills prescribed.  Will require a follow-up visit in 3 months for additional refills. - traMADol (ULTRAM) 50 MG tablet; Take 1 tablet (50 mg total) by mouth 2 (two) times daily.  Dispense: 60 tablet; Refill: 2 - oxyCODONE (OXY IR/ROXICODONE) 5 MG immediate release tablet; Take 1 tablet (5 mg total) by mouth 2 (two) times daily as needed for severe pain.  Dispense: 60 tablet; Refill: 0 - oxyCODONE (OXY IR/ROXICODONE) 5 MG immediate release tablet; Take 1 tablet (5 mg total) by mouth 2 (two) times  daily as needed for severe pain.  Dispense: 60 tablet; Refill: 0 - oxyCODONE (OXY IR/ROXICODONE) 5 MG immediate release tablet; Take 1 tablet (5 mg total) by mouth 2 (two) times daily as needed for severe pain.  Dispense: 60 tablet; Refill: 0  4. Essential (primary) hypertension Patient's blood pressure is elevated but he is not as severe pain and has run out of pain medication.  Pain medication refills have been ordered.  No changes in blood pressure medications today.  Patient will continue to check his blood pressure at home at least once daily and will call the clinic if his blood pressures persistently elevated.   General Counseling: Mayank verbalizes understanding of the findings of todays visit and agrees with plan of treatment. I have discussed any further diagnostic evaluation that may be needed or ordered today. We also reviewed his medications today. he has been encouraged to call the office with any questions or concerns that should arise related to todays visit.    Orders Placed This Encounter  Procedures   POCT HgB A1C    Meds ordered this encounter  Medications   pantoprazole (PROTONIX) 40 MG tablet    Sig: Take 2 tablets (80 mg total) by mouth daily.    Dispense:  60 tablet    Refill:  3   traMADol (ULTRAM) 50 MG tablet    Sig: Take 1 tablet (50 mg total) by mouth 2 (two) times daily.    Dispense:  60 tablet    Refill:  2   oxyCODONE (OXY IR/ROXICODONE) 5 MG immediate release tablet    Sig: Take 1 tablet (5 mg total) by mouth 2 (two) times daily as needed for severe pain.    Dispense:  60 tablet    Refill:  0   oxyCODONE (OXY IR/ROXICODONE) 5 MG immediate release tablet    Sig: Take 1 tablet (5 mg total) by mouth 2 (two) times daily as needed for severe pain.    Dispense:  60 tablet    Refill:  0   oxyCODONE (OXY IR/ROXICODONE) 5 MG immediate release tablet    Sig: Take 1 tablet (5 mg total) by mouth 2 (two) times daily as needed for  severe pain.    Dispense:  60  tablet    Refill:  0    Return in about 3 months (around 08/16/2021) for F/U, Recheck A1C, med refill, Sad[facility] Standard PCP.   Total time spent:30 Minutes Time spent includes review of chart, medications, test results, and follow up plan with the patient.   [place] Controlled Substance Database was reviewed by me.  This patient was seen by [name], FNP-C in collaboration with Dr. Clayborn Bigness as a part of collaborative care agreement.   Bryceton Hantz R. Valetta Fuller, MSN, FNP-C Internal medicine

## 2021-05-26 ENCOUNTER — Telehealth: Payer: Self-pay | Admitting: Student-PharmD

## 2021-05-26 NOTE — Progress Notes (Signed)
°  Chronic Care Management Pharmacy Assistant   Name: Russell Sullivan  MRN: [id number] DOB: 1951/01/09  Reason for Encounter: Mail Care Plan  Medications: Outpatient Encounter Medications as of 05/26/2021  Medication Sig   Accu-Chek Softclix Lancets lancets Use to check blood sugars twice a week E11.65   albuterol (VENTOLIN HFA) 108 (90 Base) MCG/ACT inhaler Inhale 2 puffs into the lungs every 6 (six) hours as needed for wheezing or shortness of breath.   amLODipine (NORVASC) 2.5 MG tablet Take 1 tablet (2.5 mg total) by mouth daily.   aspirin 81 MG EC tablet Take by mouth.   famotidine (PEPCID) 20 MG tablet Take 1 tablet (20 mg total) by mouth 2 (two) times daily for 7 days.   fluticasone (FLONASE) 50 MCG/ACT nasal spray Place 2 sprays into both nostrils daily.   folic acid (FOLVITE) 1 MG tablet folic acid 1 mg tablet  TAKE 1 TABLET BY MOUTH ONCE DAILY   glucose blood (ACCU-CHEK GUIDE) test strip check blood sugars twice a day E11.65   hydrocortisone ointment 0.5 % Apply 1 application topically 2 (two) times daily. Prn only for eczema   levocetirizine (XYZAL) 5 MG tablet Take 1 tablet (5 mg total) by mouth every evening.   meloxicam (MOBIC) 15 MG tablet Take 1 tablet (15 mg total) by mouth daily.   methotrexate (RHEUMATREX) 2.5 MG tablet Take by mouth.   oxyCODONE (OXY IR/ROXICODONE) 5 MG immediate release tablet Take 1 tablet (5 mg total) by mouth 2 (two) times daily as needed for severe pain.   [START ON 06/16/2021] oxyCODONE (OXY IR/ROXICODONE) 5 MG immediate release tablet Take 1 tablet (5 mg total) by mouth 2 (two) times daily as needed for severe pain.   [START ON 06/16/2021] oxyCODONE (OXY IR/ROXICODONE) 5 MG immediate release tablet Take 1 tablet (5 mg total) by mouth 2 (two) times daily as needed for severe pain.   [START ON 06/09/2021] pantoprazole (PROTONIX) 40 MG tablet Take 2 tablets (80 mg total) by mouth daily.   rosuvastatin (CRESTOR) 20 MG tablet Take 1 tablet (20 mg total) by  mouth at bedtime.   solifenacin (VESICARE) 5 MG tablet Take 1 tablet (5 mg total) by mouth daily.   [START ON 06/08/2021] traMADol (ULTRAM) 50 MG tablet Take 1 tablet (50 mg total) by mouth 2 (two) times daily.   No facility-administered encounter medications on file as of 05/26/2021.    Reviewed the patients visit reinsured it was completed per the pharmacist Alena Bills request. Printed the CCM care plan. Mailed the patient CCM care plan to their most recent address on file.  Charlann Lange, [place]  [phone]

## 2021-06-01 DIAGNOSIS — C61 Malignant neoplasm of prostate: Secondary | ICD-10-CM | POA: Diagnosis not present

## 2021-06-01 DIAGNOSIS — Z7189 Other specified counseling: Secondary | ICD-10-CM | POA: Diagnosis not present

## 2021-06-03 DIAGNOSIS — C61 Malignant neoplasm of prostate: Secondary | ICD-10-CM | POA: Diagnosis not present

## 2021-06-14 ENCOUNTER — Encounter: Payer: Self-pay | Admitting: Nurse Practitioner

## 2021-06-15 ENCOUNTER — Telehealth: Payer: Self-pay

## 2021-06-16 NOTE — Telephone Encounter (Signed)
Spoke to pt, informed him refills are on file and to check with the pharmacy  ?

## 2021-07-09 DIAGNOSIS — C61 Malignant neoplasm of prostate: Secondary | ICD-10-CM | POA: Diagnosis not present

## 2021-07-09 DIAGNOSIS — Z87891 Personal history of nicotine dependence: Secondary | ICD-10-CM | POA: Diagnosis not present

## 2021-07-16 ENCOUNTER — Other Ambulatory Visit: Payer: Self-pay

## 2021-07-16 DIAGNOSIS — I10 Essential (primary) hypertension: Secondary | ICD-10-CM

## 2021-07-16 DIAGNOSIS — M0579 Rheumatoid arthritis with rheumatoid factor of multiple sites without organ or systems involvement: Secondary | ICD-10-CM

## 2021-07-16 MED ORDER — AMLODIPINE BESYLATE 2.5 MG PO TABS: 2.5000 mg | ORAL_TABLET | Freq: Every day | ORAL | 1 refills | Status: DC

## 2021-07-16 MED ORDER — FOLIC ACID 1 MG PO TABS: ORAL_TABLET | ORAL | 1 refills | Status: DC

## 2021-07-28 DIAGNOSIS — C61 Malignant neoplasm of prostate: Secondary | ICD-10-CM | POA: Diagnosis not present

## 2021-07-29 DIAGNOSIS — C61 Malignant neoplasm of prostate: Secondary | ICD-10-CM | POA: Diagnosis not present

## 2021-07-31 DIAGNOSIS — C61 Malignant neoplasm of prostate: Secondary | ICD-10-CM | POA: Diagnosis not present

## 2021-08-03 DIAGNOSIS — C61 Malignant neoplasm of prostate: Secondary | ICD-10-CM | POA: Diagnosis not present

## 2021-08-05 DIAGNOSIS — C61 Malignant neoplasm of prostate: Secondary | ICD-10-CM | POA: Diagnosis not present

## 2021-08-07 DIAGNOSIS — C61 Malignant neoplasm of prostate: Secondary | ICD-10-CM | POA: Diagnosis not present

## 2021-08-13 ENCOUNTER — Telehealth: Payer: Self-pay

## 2021-08-13 DIAGNOSIS — M0579 Rheumatoid arthritis with rheumatoid factor of multiple sites without organ or systems involvement: Secondary | ICD-10-CM

## 2021-08-17 MED ORDER — OXYCODONE HCL 5 MG PO TABS: 5.0000 mg | ORAL_TABLET | Freq: Two times a day (BID) | ORAL | 0 refills | Status: DC | PRN

## 2021-08-17 NOTE — Telephone Encounter (Signed)
error 

## 2021-08-20 ENCOUNTER — Ambulatory Visit (INDEPENDENT_AMBULATORY_CARE_PROVIDER_SITE_OTHER): Payer: Medicare HMO | Admitting: Nurse Practitioner

## 2021-08-20 ENCOUNTER — Encounter: Payer: Self-pay | Admitting: Nurse Practitioner

## 2021-08-20 VITALS — BP 140/73 | HR 69 | Temp 98.2°F | Resp 16 | Ht 70.0 in | Wt 209.8 lb

## 2021-08-20 DIAGNOSIS — R0602 Shortness of breath: Secondary | ICD-10-CM

## 2021-08-20 DIAGNOSIS — E119 Type 2 diabetes mellitus without complications: Secondary | ICD-10-CM

## 2021-08-20 DIAGNOSIS — M0579 Rheumatoid arthritis with rheumatoid factor of multiple sites without organ or systems involvement: Secondary | ICD-10-CM

## 2021-08-20 DIAGNOSIS — E782 Mixed hyperlipidemia: Secondary | ICD-10-CM | POA: Diagnosis not present

## 2021-08-20 DIAGNOSIS — K219 Gastro-esophageal reflux disease without esophagitis: Secondary | ICD-10-CM

## 2021-08-20 DIAGNOSIS — Z23 Encounter for immunization: Secondary | ICD-10-CM | POA: Diagnosis not present

## 2021-08-20 DIAGNOSIS — M150 Primary generalized (osteo)arthritis: Secondary | ICD-10-CM

## 2021-08-20 DIAGNOSIS — R300 Dysuria: Secondary | ICD-10-CM

## 2021-08-20 LAB — POCT GLYCOSYLATED HEMOGLOBIN (HGB A1C): Hemoglobin A1C: 6.3 % — AB (ref 4.0–5.6)

## 2021-08-20 MED ORDER — PANTOPRAZOLE SODIUM 40 MG PO TBEC: 80.0000 mg | DELAYED_RELEASE_TABLET | Freq: Every day | ORAL | 3 refills | Status: DC

## 2021-08-20 MED ORDER — ROSUVASTATIN CALCIUM 20 MG PO TABS: 20.0000 mg | ORAL_TABLET | Freq: Every day | ORAL | 3 refills | Status: DC

## 2021-08-20 MED ORDER — SOLIFENACIN SUCCINATE 5 MG PO TABS: 5.0000 mg | ORAL_TABLET | Freq: Every day | ORAL | 1 refills | Status: DC

## 2021-08-20 MED ORDER — OXYCODONE HCL 5 MG PO TABS
5.0000 mg | ORAL_TABLET | Freq: Two times a day (BID) | ORAL | 0 refills | Status: DC | PRN
Start: 2021-09-17 — End: 2021-10-19

## 2021-08-20 MED ORDER — PNEUMOCOCCAL 20-VAL CONJ VACC 0.5 ML IM SUSY: 0.5000 mL | PREFILLED_SYRINGE | INTRAMUSCULAR | 0 refills | Status: AC

## 2021-08-20 MED ORDER — ALBUTEROL SULFATE HFA 108 (90 BASE) MCG/ACT IN AERS: 2.0000 | INHALATION_SPRAY | Freq: Four times a day (QID) | RESPIRATORY_TRACT | 5 refills | Status: DC | PRN

## 2021-08-20 MED ORDER — METHOTREXATE 2.5 MG PO TABS: 15.0000 mg | ORAL_TABLET | ORAL | 1 refills | Status: DC

## 2021-08-20 MED ORDER — TRAMADOL HCL 50 MG PO TABS: 50.0000 mg | ORAL_TABLET | Freq: Two times a day (BID) | ORAL | 2 refills | Status: DC

## 2021-08-20 MED ORDER — MELOXICAM 15 MG PO TABS: 15.0000 mg | ORAL_TABLET | Freq: Every day | ORAL | 3 refills | Status: DC

## 2021-08-20 NOTE — Progress Notes (Signed)
[facility] [place],  [id number]  Internal MEDICINE  Office Visit Note  Patient Name: Russell Sullivan  [id number]  [id number]  Date of Service: 08/20/2021  Chief Complaint  Patient presents with   Diabetes   Hyperlipidemia   Hypertension   Follow-up    HPI Cezar presents for follow-up visit for hypertension, hyperlipidemia and diabetes.  His A1c is 6.3 today which is stable patient tends to range from 5.9-6.3 according to previous A1c labs.  He is not checking his glucose consistently at this time as previously discussed with patient, since he has been stable it was agreed that he did not have to check his sugar regularly as long as he was not making any significant changes to his diet. Blood pressure is currently stable on medications. Patient has a prior diagnosis of psoriasis and rheumatoid arthritis and takes 15 mg of methotrexate weekly.  He is having difficulty getting a hold of his rheumatologist has requested to have his methotrexate refilled today via his PCP.    Current Medication: Outpatient Encounter Medications as of 08/20/2021  Medication Sig   Accu-Chek Softclix Lancets lancets Use to check blood sugars twice a week E11.65   amLODipine (NORVASC) 2.5 MG tablet Take 1 tablet (2.5 mg total) by mouth daily.   aspirin 81 MG EC tablet Take by mouth.   fluticasone (FLONASE) 50 MCG/ACT nasal spray Place 2 sprays into both nostrils daily.   folic acid (FOLVITE) 1 MG tablet folic acid 1 mg tablet  TAKE 1 TABLET BY MOUTH ONCE DAILY   glucose blood (ACCU-CHEK GUIDE) test strip check blood sugars twice a day E11.65   hydrocortisone ointment 0.5 % Apply 1 application topically 2 (two) times daily. Prn only for eczema   levocetirizine (XYZAL) 5 MG tablet Take 1 tablet (5 mg total) by mouth every evening.   [DISCONTINUED] albuterol (VENTOLIN HFA) 108 (90 Base) MCG/ACT inhaler Inhale 2 puffs into the lungs every 6 (six) hours as needed for wheezing or  shortness of breath.   [DISCONTINUED] meloxicam (MOBIC) 15 MG tablet Take 1 tablet (15 mg total) by mouth daily.   [DISCONTINUED] methotrexate (RHEUMATREX) 2.5 MG tablet Take by mouth.   [DISCONTINUED] methotrexate (RHEUMATREX) 2.5 MG tablet Take by mouth.   [DISCONTINUED] oxyCODONE (OXY IR/ROXICODONE) 5 MG immediate release tablet Take 1 tablet (5 mg total) by mouth 2 (two) times daily as needed for severe pain.   [DISCONTINUED] pantoprazole (PROTONIX) 40 MG tablet Take 2 tablets (80 mg total) by mouth daily.   [DISCONTINUED] pneumococcal 20-valent conjugate vaccine (PREVNAR 20) 0.5 ML injection Inject 0.5 mLs into the muscle tomorrow at 10 am.   [DISCONTINUED] rosuvastatin (CRESTOR) 20 MG tablet Take 1 tablet (20 mg total) by mouth at bedtime.   [DISCONTINUED] solifenacin (VESICARE) 5 MG tablet Take 1 tablet (5 mg total) by mouth daily.   [DISCONTINUED] traMADol (ULTRAM) 50 MG tablet Take 1 tablet (50 mg total) by mouth 2 (two) times daily.   albuterol (VENTOLIN HFA) 108 (90 Base) MCG/ACT inhaler Inhale 2 puffs into the lungs every 6 (six) hours as needed for wheezing or shortness of breath.   meloxicam (MOBIC) 15 MG tablet Take 1 tablet (15 mg total) by mouth daily.   methotrexate (RHEUMATREX) 2.5 MG tablet Take 6 tablets (15 mg total) by mouth once a week.   [START ON 09/17/2021] oxyCODONE (OXY IR/ROXICODONE) 5 MG immediate release tablet Take 1 tablet (5 mg total) by mouth 2 (two) times daily as needed for  severe pain.   pantoprazole (PROTONIX) 40 MG tablet Take 2 tablets (80 mg total) by mouth daily.   [EXPIRED] pneumococcal 20-valent conjugate vaccine (PREVNAR 20) 0.5 ML injection Inject 0.5 mLs into the muscle tomorrow at 10 am for 1 dose.   rosuvastatin (CRESTOR) 20 MG tablet Take 1 tablet (20 mg total) by mouth at bedtime.   solifenacin (VESICARE) 5 MG tablet Take 1 tablet (5 mg total) by mouth daily.   traMADol (ULTRAM) 50 MG tablet Take 1 tablet (50 mg total) by mouth 2 (two) times daily.    [DISCONTINUED] famotidine (PEPCID) 20 MG tablet Take 1 tablet (20 mg total) by mouth 2 (two) times daily for 7 days.   No facility-administered encounter medications on file as of 08/20/2021.    Surgical History: Past Surgical History:  Procedure Laterality Date   HERNIA REPAIR  2009   INTUBATION-ENDOTRACHEAL WITH TRACHEOSTOMY STANDBY N/A 01/13/2018   Procedure: INTUBATION-ENDOTRACHEAL WITH TRACHEOSTOMY STANDBY;  Surgeon: Beverly Gust, MD;  Location: [facility] ORS;  Service: ENT;  Laterality: N/A;   left shoulder surgery      Medical History: Past Medical History:  Diagnosis Date   Adenocarcinoma of prostate ([place])    Diabetes mellitus without complication ([place])    Hyperlipidemia    Hypertension     Family History: Family History  Problem Relation Age of Onset   Hypertension Mother    Diabetes Mother    Hypertension Father    Diabetes Maternal Grandmother    Diabetes Paternal Grandmother     Social History   Socioeconomic History   Marital status: Married    Spouse name: Not on file   Number of children: Not on file   Years of education: Not on file   Highest education level: Not on file  Occupational History   Not on file  Tobacco Use   Smoking status: Former    Types: Cigarettes   Smokeless tobacco: Never   Tobacco comments:    20 year ago  Substance and Sexual Activity   Alcohol use: Yes    Comment: ocassionally   Drug use: No   Sexual activity: Not on file  Other Topics Concern   Not on file  Social History Narrative   Not on file   Social Determinants of Health   Financial Resource Strain: Not on file  Food Insecurity: Not on file  Transportation Needs: Not on file  Physical Activity: Not on file  Stress: Not on file  Social Connections: Not on file  Intimate Partner Violence: Not on file      Review of Systems  Constitutional:  Negative for chills, fatigue and unexpected weight change.  HENT:  Negative for congestion, rhinorrhea, sneezing  and sore throat.   Eyes:  Negative for redness.  Respiratory:  Negative for cough, chest tightness and shortness of breath.   Cardiovascular:  Negative for chest pain and palpitations.  Gastrointestinal:  Negative for abdominal pain, constipation, diarrhea, nausea and vomiting.  Genitourinary:  Negative for dysuria and frequency.  Musculoskeletal:  Negative for arthralgias, back pain, joint swelling and neck pain.  Skin:  Negative for rash.  Neurological: Negative.  Negative for tremors and numbness.  Hematological:  Negative for adenopathy. Does not bruise/bleed easily.  Psychiatric/Behavioral:  Negative for behavioral problems (Depression), sleep disturbance and suicidal ideas. The patient is not nervous/anxious.    Vital Signs: BP 140/73   Pulse 69   Temp 98.2 F (36.8 C)   Resp 16   Ht '5\' 10"'$  (1.778  m)   Wt 209 lb 12.8 oz (95.2 kg)   SpO2 98%   BMI 30.10 kg/m    Physical Exam Vitals reviewed.  Constitutional:      Appearance: Normal appearance. He is obese.  HENT:     Head: Normocephalic and atraumatic.  Eyes:     Pupils: Pupils are equal, round, and reactive to light.  Cardiovascular:     Rate and Rhythm: Normal rate and regular rhythm.  Pulmonary:     Effort: Pulmonary effort is normal. No respiratory distress.  Neurological:     Mental Status: He is alert and oriented to person, place, and time.  Psychiatric:        Mood and Affect: Mood normal.        Behavior: Behavior normal.       Assessment/Plan: 1. Type 2 diabetes mellitus without complication, without long-term current use of insulin (HCC) A1c is 6.3 and stable at this time, will repeat A1c in 3 months. - POCT HgB A1C  2. Rheumatoid arthritis involving multiple sites with positive rheumatoid factor (HCC) Methotrexate refills ordered due to patient unable to get in touch of his rheumatologist.  - methotrexate (RHEUMATREX) 2.5 MG tablet; Take 6 tablets (15 mg total) by mouth once a week.  Dispense: 84  tablet; Refill: 1 - traMADol (ULTRAM) 50 MG tablet; Take 1 tablet (50 mg total) by mouth 2 (two) times daily.  Dispense: 60 tablet; Refill: 2 - oxyCODONE (OXY IR/ROXICODONE) 5 MG immediate release tablet; Take 1 tablet (5 mg total) by mouth 2 (two) times daily as needed for severe pain.  Dispense: 60 tablet; Refill: 0  3. Gastro-esophageal reflux disease without esophagitis Stable refills ordered.  - pantoprazole (PROTONIX) 40 MG tablet; Take 2 tablets (80 mg total) by mouth daily.  Dispense: 180 tablet; Refill: 3  4. Primary generalized (osteo)arthritis Refills ordered. - meloxicam (MOBIC) 15 MG tablet; Take 1 tablet (15 mg total) by mouth daily.  Dispense: 90 tablet; Refill: 3  5. SOB (shortness of breath) Refills ordered.  - albuterol (VENTOLIN HFA) 108 (90 Base) MCG/ACT inhaler; Inhale 2 puffs into the lungs every 6 (six) hours as needed for wheezing or shortness of breath.  Dispense: 18 g; Refill: 5  6. Mixed hyperlipidemia Stable, refills ordered.  - rosuvastatin (CRESTOR) 20 MG tablet; Take 1 tablet (20 mg total) by mouth at bedtime.  Dispense: 90 tablet; Refill: 3  7. Dysuria Refills ordered - solifenacin (VESICARE) 5 MG tablet; Take 1 tablet (5 mg total) by mouth daily.  Dispense: 90 tablet; Refill: 1  8. Need for vaccination - pneumococcal 20-valent conjugate vaccine (PREVNAR 20) 0.5 ML injection; Inject 0.5 mLs into the muscle tomorrow at 10 am for 1 dose.  Dispense: 0.5 mL; Refill: 0   General Counseling: Quenten verbalizes understanding of the findings of todays visit and agrees with plan of treatment. I have discussed any further diagnostic evaluation that may be needed or ordered today. We also reviewed his medications today. he has been encouraged to call the office with any questions or concerns that should arise related to todays visit.    Orders Placed This Encounter  Procedures   POCT HgB A1C    Meds ordered this encounter  Medications   pneumococcal 20-valent  conjugate vaccine (PREVNAR 20) 0.5 ML injection    Sig: Inject 0.5 mLs into the muscle tomorrow at 10 am for 1 dose.    Dispense:  0.5 mL    Refill:  0   pantoprazole (PROTONIX)  40 MG tablet    Sig: Take 2 tablets (80 mg total) by mouth daily.    Dispense:  180 tablet    Refill:  3   methotrexate (RHEUMATREX) 2.5 MG tablet    Sig: Take 6 tablets (15 mg total) by mouth once a week.    Dispense:  84 tablet    Refill:  1   meloxicam (MOBIC) 15 MG tablet    Sig: Take 1 tablet (15 mg total) by mouth daily.    Dispense:  90 tablet    Refill:  3    Please fill as 90 day prescription   rosuvastatin (CRESTOR) 20 MG tablet    Sig: Take 1 tablet (20 mg total) by mouth at bedtime.    Dispense:  90 tablet    Refill:  3   traMADol (ULTRAM) 50 MG tablet    Sig: Take 1 tablet (50 mg total) by mouth 2 (two) times daily.    Dispense:  60 tablet    Refill:  2   albuterol (VENTOLIN HFA) 108 (90 Base) MCG/ACT inhaler    Sig: Inhale 2 puffs into the lungs every 6 (six) hours as needed for wheezing or shortness of breath.    Dispense:  18 g    Refill:  5   solifenacin (VESICARE) 5 MG tablet    Sig: Take 1 tablet (5 mg total) by mouth daily.    Dispense:  90 tablet    Refill:  1   oxyCODONE (OXY IR/ROXICODONE) 5 MG immediate release tablet    Sig: Take 1 tablet (5 mg total) by mouth 2 (two) times daily as needed for severe pain.    Dispense:  60 tablet    Refill:  0    Return in about 3 months (around 11/20/2021) for F/U, Recheck A1C, pain med refill, [place] [place] PCP.   Total time spent:30 Minutes Time spent includes review of chart, medications, test results, and follow up plan with the patient.   [place] Controlled Substance Database was reviewed by me.  This patient was seen by [name], FNP-C in collaboration with Dr. Clayborn Bigness as a part of collaborative care agreement.   Griffin Dewilde R. Valetta Fuller, MSN, FNP-C Internal medicine

## 2021-08-22 ENCOUNTER — Encounter: Payer: Self-pay | Admitting: Nurse Practitioner

## 2021-08-23 ENCOUNTER — Other Ambulatory Visit: Payer: Self-pay | Admitting: Nurse Practitioner

## 2021-08-23 DIAGNOSIS — M0579 Rheumatoid arthritis with rheumatoid factor of multiple sites without organ or systems involvement: Secondary | ICD-10-CM

## 2021-08-24 NOTE — Telephone Encounter (Signed)
May change brand or generic ?

## 2021-10-16 ENCOUNTER — Telehealth: Payer: Self-pay

## 2021-10-19 ENCOUNTER — Telehealth: Payer: Self-pay

## 2021-10-19 DIAGNOSIS — M0579 Rheumatoid arthritis with rheumatoid factor of multiple sites without organ or systems involvement: Secondary | ICD-10-CM

## 2021-10-19 MED ORDER — OXYCODONE HCL 5 MG PO TABS: 5.0000 mg | ORAL_TABLET | Freq: Two times a day (BID) | ORAL | 0 refills | Status: DC | PRN

## 2021-10-19 NOTE — Telephone Encounter (Signed)
Spoke to pt, he is aware of refill

## 2021-10-19 NOTE — Telephone Encounter (Signed)
error 

## 2021-11-04 ENCOUNTER — Telehealth: Payer: Medicare HMO

## 2021-11-12 ENCOUNTER — Ambulatory Visit (INDEPENDENT_AMBULATORY_CARE_PROVIDER_SITE_OTHER): Payer: Medicare HMO | Admitting: Nurse Practitioner

## 2021-11-12 ENCOUNTER — Encounter: Payer: Self-pay | Admitting: Nurse Practitioner

## 2021-11-12 VITALS — BP 137/65 | HR 72 | Temp 98.6°F | Resp 16 | Ht 70.0 in | Wt 207.6 lb

## 2021-11-12 DIAGNOSIS — Z23 Encounter for immunization: Secondary | ICD-10-CM

## 2021-11-12 DIAGNOSIS — I10 Essential (primary) hypertension: Secondary | ICD-10-CM

## 2021-11-12 DIAGNOSIS — M0579 Rheumatoid arthritis with rheumatoid factor of multiple sites without organ or systems involvement: Secondary | ICD-10-CM

## 2021-11-12 DIAGNOSIS — E119 Type 2 diabetes mellitus without complications: Secondary | ICD-10-CM | POA: Diagnosis not present

## 2021-11-12 DIAGNOSIS — L03115 Cellulitis of right lower limb: Secondary | ICD-10-CM | POA: Diagnosis not present

## 2021-11-12 LAB — POCT GLYCOSYLATED HEMOGLOBIN (HGB A1C): Hemoglobin A1C: 6.3 % — AB (ref 4.0–5.6)

## 2021-11-12 MED ORDER — ACCU-CHEK GUIDE VI STRP: ORAL_STRIP | 5 refills | Status: AC

## 2021-11-12 MED ORDER — PNEUMOCOCCAL 20-VAL CONJ VACC 0.5 ML IM SUSY: 0.5000 mL | PREFILLED_SYRINGE | INTRAMUSCULAR | 0 refills | Status: AC

## 2021-11-12 MED ORDER — METHOTREXATE SODIUM 2.5 MG PO TABS: 15.0000 mg | ORAL_TABLET | ORAL | 1 refills | Status: DC

## 2021-11-12 MED ORDER — OXYCODONE HCL 5 MG PO TABS: 5.0000 mg | ORAL_TABLET | Freq: Two times a day (BID) | ORAL | 0 refills | Status: DC | PRN

## 2021-11-12 MED ORDER — FOLIC ACID 1 MG PO TABS: ORAL_TABLET | ORAL | 1 refills | Status: DC

## 2021-11-12 MED ORDER — ACCU-CHEK SOFTCLIX LANCETS MISC: 5 refills | Status: AC

## 2021-11-12 MED ORDER — AMLODIPINE BESYLATE 2.5 MG PO TABS: 2.5000 mg | ORAL_TABLET | Freq: Every day | ORAL | 1 refills | Status: DC

## 2021-11-12 MED ORDER — DOXYCYCLINE HYCLATE 100 MG PO TABS: 100.0000 mg | ORAL_TABLET | Freq: Two times a day (BID) | ORAL | 0 refills | Status: AC

## 2021-11-12 MED ORDER — TRAMADOL HCL 50 MG PO TABS: 50.0000 mg | ORAL_TABLET | Freq: Two times a day (BID) | ORAL | 2 refills | Status: DC

## 2021-11-12 NOTE — Progress Notes (Signed)
[facility] [place], [place] [address]  Internal MEDICINE  Office Visit Note  Patient Name: Russell Sullivan  [id number]  [id number]  Date of Service: 11/12/2021  Chief Complaint  Patient presents with   Follow-up   Diabetes   Hypertension   Hyperlipidemia   Medication Refill    HPI Russell Sullivan presents for a follow up visit for diabetes, hypertension, rheumatoid arthritis and refills.  Diabetes -- no change in A1c today, still 6.3. lost 2 lbs since prior office visit. Diet-controlled, not taking any diabetic medications Hypertension -- controlled with amlodipine 2.5 mg daily Rheumatoid arthritis -- affecting multiple joints, takes tramadol and oxycodone to manage pain, alternating the medications, does not take them at the same time. Also taking folic acid 1 mg daily and methotrexate 15 mg weekly. Needs refills. Last visit with rheumatology was November 2022, was supposed to follow up 4 months later in march 2023 but was going to radiation treatments for prostate cancer and has not seen his rheumatologist since last year.  Reports tender cyst on back of right upper leg with an abrasion from tape next to it. Confirms it is red, swollen and hot to touch.     Current Medication: Outpatient Encounter Medications as of 11/12/2021  Medication Sig   albuterol (VENTOLIN HFA) 108 (90 Base) MCG/ACT inhaler Inhale 2 puffs into the lungs every 6 (six) hours as needed for wheezing or shortness of breath.   aspirin 81 MG EC tablet Take by mouth.   doxycycline (VIBRA-TABS) 100 MG tablet Take 1 tablet (100 mg total) by mouth 2 (two) times daily for 7 days.   fluticasone (FLONASE) 50 MCG/ACT nasal spray Place 2 sprays into both nostrils daily.   hydrocortisone ointment 0.5 % Apply 1 application topically 2 (two) times daily. Prn only for eczema   meloxicam (MOBIC) 15 MG tablet Take 1 tablet (15 mg total) by mouth daily.   pantoprazole (PROTONIX) 40 MG tablet Take 2 tablets (80 mg  total) by mouth daily.   rosuvastatin (CRESTOR) 20 MG tablet Take 1 tablet (20 mg total) by mouth at bedtime.   solifenacin (VESICARE) 5 MG tablet Take 1 tablet (5 mg total) by mouth daily.   [DISCONTINUED] Accu-Chek Softclix Lancets lancets Use to check blood sugars twice a week E11.65   [DISCONTINUED] amLODipine (NORVASC) 2.5 MG tablet Take 1 tablet (2.5 mg total) by mouth daily.   [DISCONTINUED] folic acid (FOLVITE) 1 MG tablet folic acid 1 mg tablet  TAKE 1 TABLET BY MOUTH ONCE DAILY   [DISCONTINUED] glucose blood (ACCU-CHEK GUIDE) test strip check blood sugars twice a day E11.65   [DISCONTINUED] levocetirizine (XYZAL) 5 MG tablet Take 1 tablet (5 mg total) by mouth every evening.   [DISCONTINUED] methotrexate 2.5 MG tablet TAKE 6 TABLETS BY MOUTH ONCE A WEEK   [DISCONTINUED] oxyCODONE (OXY IR/ROXICODONE) 5 MG immediate release tablet Take 1 tablet (5 mg total) by mouth 2 (two) times daily as needed for severe pain.   [DISCONTINUED] pneumococcal 20-valent conjugate vaccine (PREVNAR 20) 0.5 ML injection Inject 0.5 mLs into the muscle tomorrow at 10 am.   [DISCONTINUED] traMADol (ULTRAM) 50 MG tablet Take 1 tablet (50 mg total) by mouth 2 (two) times daily.   Accu-Chek Softclix Lancets lancets Use to check blood sugars twice a week E11.65   amLODipine (NORVASC) 2.5 MG tablet Take 1 tablet (2.5 mg total) by mouth daily.   folic acid (FOLVITE) 1 MG tablet folic acid 1 mg tablet  TAKE 1  TABLET BY MOUTH ONCE DAILY   glucose blood (ACCU-CHEK GUIDE) test strip check blood sugars twice a day E11.65   methotrexate 2.5 MG tablet Take 6 tablets (15 mg total) by mouth once a week. Caution:Chemotherapy. Protect from light.   oxyCODONE (OXY IR/ROXICODONE) 5 MG immediate release tablet Take 1 tablet (5 mg total) by mouth 2 (two) times daily as needed for severe pain.   [START ON 12/11/2021] oxyCODONE (OXY IR/ROXICODONE) 5 MG immediate release tablet Take 1 tablet (5 mg total) by mouth 2 (two) times daily as  needed for severe pain.   [START ON 01/01/2022] oxyCODONE (OXY IR/ROXICODONE) 5 MG immediate release tablet Take 1 tablet (5 mg total) by mouth 2 (two) times daily as needed for severe pain.   pneumococcal 20-valent conjugate vaccine (PREVNAR 20) 0.5 ML injection Inject 0.5 mLs into the muscle tomorrow at 10 am for 1 dose.   traMADol (ULTRAM) 50 MG tablet Take 1 tablet (50 mg total) by mouth 2 (two) times daily.   No facility-administered encounter medications on file as of 11/12/2021.    Surgical History: Past Surgical History:  Procedure Laterality Date   HERNIA REPAIR  2009   INTUBATION-ENDOTRACHEAL WITH TRACHEOSTOMY STANDBY N/A 01/13/2018   Procedure: INTUBATION-ENDOTRACHEAL WITH TRACHEOSTOMY STANDBY;  Surgeon: Beverly Gust, MD;  Location: [facility] ORS;  Service: ENT;  Laterality: N/A;   left shoulder surgery      Medical History: Past Medical History:  Diagnosis Date   Adenocarcinoma of prostate ([place])    Diabetes mellitus without complication ([place])    Hyperlipidemia    Hypertension     Family History: Family History  Problem Relation Age of Onset   Hypertension Mother    Diabetes Mother    Hypertension Father    Diabetes Maternal Grandmother    Diabetes Paternal Grandmother     Social History   Socioeconomic History   Marital status: Married    Spouse name: Not on file   Number of children: Not on file   Years of education: Not on file   Highest education level: Not on file  Occupational History   Not on file  Tobacco Use   Smoking status: Former    Types: Cigarettes   Smokeless tobacco: Never   Tobacco comments:    20 year ago  Substance and Sexual Activity   Alcohol use: Yes    Comment: ocassionally   Drug use: No   Sexual activity: Not on file  Other Topics Concern   Not on file  Social History Narrative   Not on file   Social Determinants of Health   Financial Resource Strain: Not on file  Food Insecurity: Not on file  Transportation Needs: Not  on file  Physical Activity: Not on file  Stress: Not on file  Social Connections: Not on file  Intimate Partner Violence: Not on file      Review of Systems  Constitutional:  Negative for chills, fatigue and unexpected weight change.  HENT:  Negative for congestion, rhinorrhea, sneezing and sore throat.   Eyes:  Negative for redness.  Respiratory:  Negative for cough, chest tightness, shortness of breath and wheezing.   Cardiovascular: Negative.  Negative for chest pain and palpitations.  Gastrointestinal:  Negative for abdominal pain, constipation, diarrhea, nausea and vomiting.  Genitourinary:  Negative for dysuria and frequency.  Musculoskeletal:  Positive for arthralgias, back pain and joint swelling. Negative for neck pain.  Skin:  Positive for wound.  Neurological: Negative.  Negative for tremors and  numbness.  Hematological:  Negative for adenopathy. Does not bruise/bleed easily.  Psychiatric/Behavioral:  Negative for behavioral problems (Depression), self-injury, sleep disturbance and suicidal ideas. The patient is not nervous/anxious.     Vital Signs: BP 137/65   Pulse 72   Temp 98.6 F (37 C)   Resp 16   Ht '5\' 10"'$  (1.778 m)   Wt 207 lb 9.6 oz (94.2 kg)   SpO2 98%   BMI 29.79 kg/m    Physical Exam Vitals reviewed.  Constitutional:      General: He is not in acute distress.    Appearance: Normal appearance. He is not ill-appearing.  HENT:     Head: Normocephalic and atraumatic.  Eyes:     Pupils: Pupils are equal, round, and reactive to light.  Cardiovascular:     Rate and Rhythm: Normal rate and regular rhythm.  Pulmonary:     Effort: Pulmonary effort is normal. No respiratory distress.  Skin:    Findings: Wound present.     Comments: Small abrasion next to tender superficial cyst on the posterior aspect of his right upper leg. There is erythema, swelling and the area is hot to touch, no purulent drainage noted. Concern for developing cellulitis.   Neurological:     Mental Status: He is alert and oriented to person, place, and time.  Psychiatric:        Mood and Affect: Mood normal.        Behavior: Behavior normal.        Assessment/Plan: 1. Type 2 diabetes mellitus without complication, without long-term current use of insulin (HCC) Continue to monitor blood sugars at least once a day (AM fasting). Not currently taking any diabetic medications. Continuing managing diabetes with diet and lifestyle modifications. Follow up in 3 months for repeat A1C - POCT HgB A1C - glucose blood (ACCU-CHEK GUIDE) test strip; check blood sugars twice a day E11.65  Dispense: 100 each; Refill: 5 - Accu-Chek Softclix Lancets lancets; Use to check blood sugars twice a week E11.65  Dispense: 100 each; Refill: 5  2. Essential (primary) hypertension Stable, continue amlodipine as prescribed.  - amLODipine (NORVASC) 2.5 MG tablet; Take 1 tablet (2.5 mg total) by mouth daily.  Dispense: 90 tablet; Refill: 1  3. Rheumatoid arthritis involving multiple sites with positive rheumatoid factor ([place]) Refills ordered as discussed with patient. Patient instructed to call his rheumatologist to schedule an appointment to check in. - folic acid (FOLVITE) 1 MG tablet; folic acid 1 mg tablet  TAKE 1 TABLET BY MOUTH ONCE DAILY  Dispense: 90 tablet; Refill: 1 - traMADol (ULTRAM) 50 MG tablet; Take 1 tablet (50 mg total) by mouth 2 (two) times daily.  Dispense: 60 tablet; Refill: 2 - oxyCODONE (OXY IR/ROXICODONE) 5 MG immediate release tablet; Take 1 tablet (5 mg total) by mouth 2 (two) times daily as needed for severe pain.  Dispense: 60 tablet; Refill: 0 - oxyCODONE (OXY IR/ROXICODONE) 5 MG immediate release tablet; Take 1 tablet (5 mg total) by mouth 2 (two) times daily as needed for severe pain.  Dispense: 60 tablet; Refill: 0 - oxyCODONE (OXY IR/ROXICODONE) 5 MG immediate release tablet; Take 1 tablet (5 mg total) by mouth 2 (two) times daily as needed for severe  pain.  Dispense: 60 tablet; Refill: 0 - methotrexate 2.5 MG tablet; Take 6 tablets (15 mg total) by mouth once a week. Caution:Chemotherapy. Protect from light.  Dispense: 84 tablet; Refill: 1  4. Cellulitis of right lower extremity Signs of skin infection  are present, empiric treatment for cellulitis prescribed --doxycycline - doxycycline (VIBRA-TABS) 100 MG tablet; Take 1 tablet (100 mg total) by mouth 2 (two) times daily for 7 days.  Dispense: 14 tablet; Refill: 0  5. Need for pneumococcal 20-valent conjugate vaccination - pneumococcal 20-valent conjugate vaccine (PREVNAR 20) 0.5 ML injection; Inject 0.5 mLs into the muscle tomorrow at 10 am for 1 dose.  Dispense: 0.5 mL; Refill: 0   General Counseling: Russell Sullivan verbalizes understanding of the findings of todays visit and agrees with plan of treatment. I have discussed any further diagnostic evaluation that may be needed or ordered today. We also reviewed his medications today. he has been encouraged to call the office with any questions or concerns that should arise related to todays visit.    Orders Placed This Encounter  Procedures   POCT HgB A1C    Meds ordered this encounter  Medications   pneumococcal 20-valent conjugate vaccine (PREVNAR 20) 0.5 ML injection    Sig: Inject 0.5 mLs into the muscle tomorrow at 10 am for 1 dose.    Dispense:  0.5 mL    Refill:  0   amLODipine (NORVASC) 2.5 MG tablet    Sig: Take 1 tablet (2.5 mg total) by mouth daily.    Dispense:  90 tablet    Refill:  1   folic acid (FOLVITE) 1 MG tablet    Sig: folic acid 1 mg tablet  TAKE 1 TABLET BY MOUTH ONCE DAILY    Dispense:  90 tablet    Refill:  1   traMADol (ULTRAM) 50 MG tablet    Sig: Take 1 tablet (50 mg total) by mouth 2 (two) times daily.    Dispense:  60 tablet    Refill:  2   oxyCODONE (OXY IR/ROXICODONE) 5 MG immediate release tablet    Sig: Take 1 tablet (5 mg total) by mouth 2 (two) times daily as needed for severe pain.    Dispense:   60 tablet    Refill:  0   oxyCODONE (OXY IR/ROXICODONE) 5 MG immediate release tablet    Sig: Take 1 tablet (5 mg total) by mouth 2 (two) times daily as needed for severe pain.    Dispense:  60 tablet    Refill:  0    September fill   oxyCODONE (OXY IR/ROXICODONE) 5 MG immediate release tablet    Sig: Take 1 tablet (5 mg total) by mouth 2 (two) times daily as needed for severe pain.    Dispense:  60 tablet    Refill:  0    Fill for late september   doxycycline (VIBRA-TABS) 100 MG tablet    Sig: Take 1 tablet (100 mg total) by mouth 2 (two) times daily for 7 days.    Dispense:  14 tablet    Refill:  0   glucose blood (ACCU-CHEK GUIDE) test strip    Sig: check blood sugars twice a day E11.65    Dispense:  100 each    Refill:  5   Accu-Chek Softclix Lancets lancets    Sig: Use to check blood sugars twice a week E11.65    Dispense:  100 each    Refill:  5   methotrexate 2.5 MG tablet    Sig: Take 6 tablets (15 mg total) by mouth once a week. Caution:Chemotherapy. Protect from light.    Dispense:  84 tablet    Refill:  1    Return for previously scheduled, CPE, Kadra Kohan PCP in october. Marland Kitchen  Total time spent:30 Minutes Time spent includes review of chart, medications, test results, and follow up plan with the patient.   [place] Controlled Substance Database was reviewed by me.  This patient was seen by [name], FNP-C in collaboration with Dr. Clayborn Bigness as a part of collaborative care agreement.   Jelani Trueba R. Valetta Fuller, MSN, FNP-C Internal medicine

## 2021-11-18 DIAGNOSIS — C61 Malignant neoplasm of prostate: Secondary | ICD-10-CM | POA: Diagnosis not present

## 2021-12-26 ENCOUNTER — Encounter: Payer: Self-pay | Admitting: Nurse Practitioner

## 2022-01-06 DIAGNOSIS — H524 Presbyopia: Secondary | ICD-10-CM | POA: Diagnosis not present

## 2022-02-01 ENCOUNTER — Ambulatory Visit (INDEPENDENT_AMBULATORY_CARE_PROVIDER_SITE_OTHER): Payer: Medicare HMO | Admitting: Nurse Practitioner

## 2022-02-01 ENCOUNTER — Encounter: Payer: Self-pay | Admitting: Nurse Practitioner

## 2022-02-01 VITALS — BP 144/76 | HR 98 | Temp 97.7°F | Resp 16 | Ht 70.0 in | Wt 208.8 lb

## 2022-02-01 DIAGNOSIS — L219 Seborrheic dermatitis, unspecified: Secondary | ICD-10-CM | POA: Diagnosis not present

## 2022-02-01 DIAGNOSIS — E119 Type 2 diabetes mellitus without complications: Secondary | ICD-10-CM | POA: Diagnosis not present

## 2022-02-01 DIAGNOSIS — Z760 Encounter for issue of repeat prescription: Secondary | ICD-10-CM

## 2022-02-01 DIAGNOSIS — R300 Dysuria: Secondary | ICD-10-CM

## 2022-02-01 DIAGNOSIS — L720 Epidermal cyst: Secondary | ICD-10-CM | POA: Diagnosis not present

## 2022-02-01 DIAGNOSIS — Z23 Encounter for immunization: Secondary | ICD-10-CM

## 2022-02-01 DIAGNOSIS — Z0001 Encounter for general adult medical examination with abnormal findings: Secondary | ICD-10-CM | POA: Diagnosis not present

## 2022-02-01 MED ORDER — DOXYCYCLINE HYCLATE 100 MG PO TABS: 100.0000 mg | ORAL_TABLET | Freq: Two times a day (BID) | ORAL | 0 refills | Status: DC

## 2022-02-01 MED ORDER — OXYCODONE HCL 5 MG PO TABS: 5.0000 mg | ORAL_TABLET | Freq: Two times a day (BID) | ORAL | 0 refills | Status: DC | PRN

## 2022-02-01 MED ORDER — TRIAMCINOLONE ACETONIDE 0.1 % EX CREA: 1.0000 | TOPICAL_CREAM | Freq: Two times a day (BID) | CUTANEOUS | 2 refills | Status: DC

## 2022-02-01 MED ORDER — TRAMADOL HCL 50 MG PO TABS: 50.0000 mg | ORAL_TABLET | Freq: Two times a day (BID) | ORAL | 2 refills | Status: DC

## 2022-02-01 NOTE — Progress Notes (Signed)
[facility] [place], [place] [address]  Internal MEDICINE  Office Visit Note  Patient Name: Russell Sullivan  [id number]  [id number]  Date of Service: 02/01/2022  Chief Complaint  Patient presents with   Medicare Wellness   Hyperlipidemia   Hypertension   Diabetes    HPI [place] presents for an annual well visit and physical exam.  Well-appearing 71 year old male with hypertension, GERD, RA, high cholesterol and prostate cancer s/p prostatectomy. PSA is stable, down to 0.94 -Taking 6 tabs of methotrexate once weekly. Continues to take tramadol and oxycodone prn to help manage his pain. -A1c 6.3 in august, remains stable  -Sleep is ok -due for routine fasting labs --reports sharp epigastric pain that comes and goes. Most often happens at night. Initial onset was 1.5 weeks ago. Pain occurs with movement and sometimes with breathing.  --still has tender Cyst on right back upper leg, he has been told by multiple providers that he can either leave it alone or have the cyst surgically removed.     Current Medication: Outpatient Encounter Medications as of 02/01/2022  Medication Sig   Accu-Chek Softclix Lancets lancets Use to check blood sugars twice a week E11.65   albuterol (VENTOLIN HFA) 108 (90 Base) MCG/ACT inhaler Inhale 2 puffs into the lungs every 6 (six) hours as needed for wheezing or shortness of breath.   amLODipine (NORVASC) 2.5 MG tablet Take 1 tablet (2.5 mg total) by mouth daily.   aspirin 81 MG EC tablet Take by mouth.   doxycycline (VIBRA-TABS) 100 MG tablet Take 1 tablet (100 mg total) by mouth 2 (two) times daily.   fluticasone (FLONASE) 50 MCG/ACT nasal spray Place 2 sprays into both nostrils daily.   folic acid (FOLVITE) 1 MG tablet folic acid 1 mg tablet  TAKE 1 TABLET BY MOUTH ONCE DAILY   glucose blood (ACCU-CHEK GUIDE) test strip check blood sugars twice a day E11.65   hydrocortisone ointment 0.5 % Apply 1 application topically 2  (two) times daily. Prn only for eczema   meloxicam (MOBIC) 15 MG tablet Take 1 tablet (15 mg total) by mouth daily.   methotrexate 2.5 MG tablet Take 6 tablets (15 mg total) by mouth once a week. Caution:Chemotherapy. Protect from light.   pantoprazole (PROTONIX) 40 MG tablet Take 2 tablets (80 mg total) by mouth daily.   rosuvastatin (CRESTOR) 20 MG tablet Take 1 tablet (20 mg total) by mouth at bedtime.   solifenacin (VESICARE) 5 MG tablet Take 1 tablet (5 mg total) by mouth daily.   triamcinolone cream (KENALOG) 0.1 % Apply 1 Application topically 2 (two) times daily. To affected area until resolved.   [DISCONTINUED] oxyCODONE (OXY IR/ROXICODONE) 5 MG immediate release tablet Take 1 tablet (5 mg total) by mouth 2 (two) times daily as needed for severe pain.   [DISCONTINUED] oxyCODONE (OXY IR/ROXICODONE) 5 MG immediate release tablet Take 1 tablet (5 mg total) by mouth 2 (two) times daily as needed for severe pain.   [DISCONTINUED] oxyCODONE (OXY IR/ROXICODONE) 5 MG immediate release tablet Take 1 tablet (5 mg total) by mouth 2 (two) times daily as needed for severe pain.   [DISCONTINUED] traMADol (ULTRAM) 50 MG tablet Take 1 tablet (50 mg total) by mouth 2 (two) times daily.   oxyCODONE (OXY IR/ROXICODONE) 5 MG immediate release tablet Take 1 tablet (5 mg total) by mouth 2 (two) times daily as needed for severe pain.   oxyCODONE (OXY IR/ROXICODONE) 5 MG immediate release tablet  Take 1 tablet (5 mg total) by mouth 2 (two) times daily as needed for severe pain.   oxyCODONE (OXY IR/ROXICODONE) 5 MG immediate release tablet Take 1 tablet (5 mg total) by mouth 2 (two) times daily as needed for severe pain.   traMADol (ULTRAM) 50 MG tablet Take 1 tablet (50 mg total) by mouth 2 (two) times daily.   No facility-administered encounter medications on file as of 02/01/2022.    Surgical History: Past Surgical History:  Procedure Laterality Date   HERNIA REPAIR  2009   INTUBATION-ENDOTRACHEAL WITH  TRACHEOSTOMY STANDBY N/A 01/13/2018   Procedure: INTUBATION-ENDOTRACHEAL WITH TRACHEOSTOMY STANDBY;  Surgeon: Beverly Gust, MD;  Location: [facility] ORS;  Service: ENT;  Laterality: N/A;   left shoulder surgery      Medical History: Past Medical History:  Diagnosis Date   Adenocarcinoma of prostate ([place])    Diabetes mellitus without complication ([place])    Hyperlipidemia    Hypertension     Family History: Family History  Problem Relation Age of Onset   Hypertension Mother    Diabetes Mother    Hypertension Father    Diabetes Maternal Grandmother    Diabetes Paternal Grandmother     Social History   Socioeconomic History   Marital status: Married    Spouse name: Not on file   Number of children: Not on file   Years of education: Not on file   Highest education level: Not on file  Occupational History   Not on file  Tobacco Use   Smoking status: Former    Types: Cigarettes   Smokeless tobacco: Never   Tobacco comments:    20 year ago  Substance and Sexual Activity   Alcohol use: Yes    Comment: ocassionally   Drug use: No   Sexual activity: Not on file  Other Topics Concern   Not on file  Social History Narrative   Not on file   Social Determinants of Health   Financial Resource Strain: Not on file  Food Insecurity: Not on file  Transportation Needs: Not on file  Physical Activity: Not on file  Stress: Not on file  Social Connections: Not on file  Intimate Partner Violence: Not on file      Review of Systems  Constitutional:  Negative for chills, fatigue and unexpected weight change.  HENT:  Negative for congestion, rhinorrhea, sneezing and sore throat.   Eyes:  Negative for redness.  Respiratory:  Negative for cough, chest tightness and shortness of breath.   Cardiovascular:  Negative for chest pain and palpitations.  Gastrointestinal:  Negative for abdominal pain, constipation, diarrhea, nausea and vomiting.  Genitourinary:  Negative for dysuria and  frequency.  Musculoskeletal:  Positive for arthralgias and back pain. Negative for joint swelling and neck pain.  Skin:  Positive for rash.       Recovering from posion ivy on arms  Neurological: Negative.  Negative for tremors and numbness.  Hematological:  Negative for adenopathy. Does not bruise/bleed easily.  Psychiatric/Behavioral:  Negative for behavioral problems (Depression), sleep disturbance and suicidal ideas. The patient is not nervous/anxious.     Vital Signs: BP (!) 144/76   Pulse 98   Temp 97.7 F (36.5 C)   Resp 16   Ht 5' 10"  (1.778 m)   Wt 208 lb 12.8 oz (94.7 kg)   SpO2 99%   BMI 29.96 kg/m    Physical Exam Vitals and nursing note reviewed.  Constitutional:      General: He  is awake. He is not in acute distress.    Appearance: Normal appearance. He is well-developed and well-groomed. He is obese. He is not ill-appearing or diaphoretic.  HENT:     Head: Normocephalic and atraumatic.     Right Ear: Tympanic membrane, ear canal and external ear normal.     Left Ear: Tympanic membrane, ear canal and external ear normal.     Nose: Nose normal. No congestion or rhinorrhea.     Mouth/Throat:     Lips: Pink.     Mouth: Mucous membranes are moist.     Pharynx: Oropharynx is clear. Uvula midline. No oropharyngeal exudate or posterior oropharyngeal erythema.  Eyes:     General: Lids are normal. Vision grossly intact. Gaze aligned appropriately. No scleral icterus.       Right eye: No discharge.        Left eye: No discharge.     Extraocular Movements: Extraocular movements intact.     Conjunctiva/sclera: Conjunctivae normal.     Pupils: Pupils are equal, round, and reactive to light.     Funduscopic exam:    Right eye: Red reflex present.        Left eye: Red reflex present. Neck:     Thyroid: No thyromegaly.     Vascular: No carotid bruit or JVD.     Trachea: Trachea and phonation normal. No tracheal deviation.  Cardiovascular:     Rate and Rhythm: Normal  rate and regular rhythm.     Pulses: Normal pulses.     Heart sounds: Normal heart sounds, S1 normal and S2 normal. No murmur heard.    No friction rub. No gallop.  Pulmonary:     Effort: Pulmonary effort is normal. No accessory muscle usage or respiratory distress.     Breath sounds: Normal breath sounds and air entry. No stridor. No wheezing or rales.  Chest:     Chest wall: No tenderness.  Abdominal:     General: Bowel sounds are normal. There is no distension.     Palpations: Abdomen is soft. There is no mass.     Tenderness: There is no abdominal tenderness. There is no guarding or rebound.  Musculoskeletal:        General: No tenderness or deformity. Normal range of motion.     Cervical back: Normal range of motion and neck supple.     Right lower leg: No edema.     Left lower leg: No edema.  Lymphadenopathy:     Cervical: No cervical adenopathy.  Skin:    General: Skin is warm and dry.     Capillary Refill: Capillary refill takes less than 2 seconds.     Coloration: Skin is not pale.     Findings: Lesion (tender soft epidermoid cyst on back of right upper leg) and rash (rash on scalp and posterior neck hairline) present. No erythema.  Neurological:     Mental Status: He is alert and oriented to person, place, and time.     Cranial Nerves: No cranial nerve deficit.     Motor: No abnormal muscle tone.     Coordination: Coordination normal.     Gait: Gait normal.     Deep Tendon Reflexes: Reflexes are normal and symmetric.  Psychiatric:        Mood and Affect: Mood normal.        Behavior: Behavior normal. Behavior is cooperative.        Thought Content: Thought content normal.  Judgment: Judgment normal.        Assessment/Plan: 1. Encounter for general adult medical examination with abnormal findings Age-appropriate preventive screenings and vaccinations discussed, annual physical exam completed. Routine labs for health maintenance ordered, see below. PHM  updated.  - CBC with Differential/Platelet - CMP14+EGFR - Lipid Profile  2. Type 2 diabetes mellitus without complication, without long-term current use of insulin (HCC) Routine labs ordered, last a1c is stable at 6.3, continue medications as prescribed. Repeat a1c in 3 months - CBC with Differential/Platelet - CMP14+EGFR - Lipid Profile - Urine Microalbumin w/creat. ratio  3. Needs flu shot Administered in office today - Flu Vaccine MDCK QUAD PF  4. Epidermoid cyst No interventions at this time. He will request a dermatology referral if he decides to have it removed  5. Seborrheic dermatitis of scalp Topical steroid prescribed as well as antibiotic course since this has helped in the past.  - triamcinolone cream (KENALOG) 0.1 %; Apply 1 Application topically 2 (two) times daily. To affected area until resolved.  Dispense: 45 g; Refill: 2 - doxycycline (VIBRA-TABS) 100 MG tablet; Take 1 tablet (100 mg total) by mouth 2 (two) times daily.  Dispense: 20 tablet; Refill: 0  6. Dysuria Routine urinalysis done - UA/M w/rflx Culture, Routine  7. Medication refill - oxyCODONE (OXY IR/ROXICODONE) 5 MG immediate release tablet; Take 1 tablet (5 mg total) by mouth 2 (two) times daily as needed for severe pain.  Dispense: 60 tablet; Refill: 0 - oxyCODONE (OXY IR/ROXICODONE) 5 MG immediate release tablet; Take 1 tablet (5 mg total) by mouth 2 (two) times daily as needed for severe pain.  Dispense: 60 tablet; Refill: 0 - oxyCODONE (OXY IR/ROXICODONE) 5 MG immediate release tablet; Take 1 tablet (5 mg total) by mouth 2 (two) times daily as needed for severe pain.  Dispense: 60 tablet; Refill: 0 - traMADol (ULTRAM) 50 MG tablet; Take 1 tablet (50 mg total) by mouth 2 (two) times daily.  Dispense: 60 tablet; Refill: 2     General Counseling: Jarion verbalizes understanding of the findings of todays visit and agrees with plan of treatment. I have discussed any further diagnostic evaluation that may  be needed or ordered today. We also reviewed his medications today. he has been encouraged to call the office with any questions or concerns that should arise related to todays visit.    Orders Placed This Encounter  Procedures   Flu Vaccine MDCK QUAD PF   CBC with Differential/Platelet   CMP14+EGFR   Lipid Profile    Meds ordered this encounter  Medications   oxyCODONE (OXY IR/ROXICODONE) 5 MG immediate release tablet    Sig: Take 1 tablet (5 mg total) by mouth 2 (two) times daily as needed for severe pain.    Dispense:  60 tablet    Refill:  0   oxyCODONE (OXY IR/ROXICODONE) 5 MG immediate release tablet    Sig: Take 1 tablet (5 mg total) by mouth 2 (two) times daily as needed for severe pain.    Dispense:  60 tablet    Refill:  0    September fill   oxyCODONE (OXY IR/ROXICODONE) 5 MG immediate release tablet    Sig: Take 1 tablet (5 mg total) by mouth 2 (two) times daily as needed for severe pain.    Dispense:  60 tablet    Refill:  0    Fill for late september   traMADol (ULTRAM) 50 MG tablet    Sig: Take 1 tablet (50  mg total) by mouth 2 (two) times daily.    Dispense:  60 tablet    Refill:  2   triamcinolone cream (KENALOG) 0.1 %    Sig: Apply 1 Application topically 2 (two) times daily. To affected area until resolved.    Dispense:  45 g    Refill:  2   doxycycline (VIBRA-TABS) 100 MG tablet    Sig: Take 1 tablet (100 mg total) by mouth 2 (two) times daily.    Dispense:  20 tablet    Refill:  0    Return in about 3 months (around 05/04/2022) for F/U, Recheck A1C, Javana Schey PCP.   Total time spent:30 Minutes Time spent includes review of chart, medications, test results, and follow up plan with the patient.   [place] Controlled Substance Database was reviewed by me.  This patient was seen by [name], FNP-C in collaboration with Dr. Clayborn Bigness as a part of collaborative care agreement.  Valentine Kuechle R. Valetta Fuller, MSN, FNP-C Internal medicine

## 2022-02-03 LAB — UA/M W/RFLX CULTURE, ROUTINE
Bilirubin, UA: NEGATIVE
Glucose, UA: NEGATIVE
Ketones, UA: NEGATIVE
Leukocytes,UA: NEGATIVE
Nitrite, UA: NEGATIVE
Protein,UA: NEGATIVE
RBC, UA: NEGATIVE
Specific Gravity, UA: 1.009 (ref 1.005–1.030)
Urobilinogen, Ur: 0.2 mg/dL (ref 0.2–1.0)
pH, UA: 6.5 (ref 5.0–7.5)

## 2022-02-03 LAB — MICROALBUMIN / CREATININE URINE RATIO
Creatinine, Urine: 49.7 mg/dL
Microalb/Creat Ratio: <6 mg/g creat (ref 0–29)
Microalbumin, Urine: <3 ug/mL

## 2022-02-03 LAB — MICROSCOPIC EXAMINATION
Bacteria, UA: NONE SEEN
Casts: NONE SEEN /lpf
Epithelial Cells (non renal): NONE SEEN /hpf (ref 0–10)
RBC, Urine: NONE SEEN /hpf (ref 0–2)
WBC, UA: NONE SEEN /hpf (ref 0–5)

## 2022-03-03 IMAGING — CR DG CERVICAL SPINE COMPLETE 4+V
1 series · 7 of 7 positions shown · non-contrast
Comparison: None.

CLINICAL DATA: Pt reports neck and lower back pain x "40 years." pt
reports pain when rotating neck left to right. Hx of degenerative
disc disease, cervical with hx of fx of cervical vertebrae. Pt
reports lower back pain that radiates down his legs. No known injury
to lower back

EXAM:
CERVICAL SPINE - COMPLETE 4+ VIEW

[Series 1: dg cervical spine complete · 0.14mm/px · 7 of 7 slices shown]
[im 1/7]
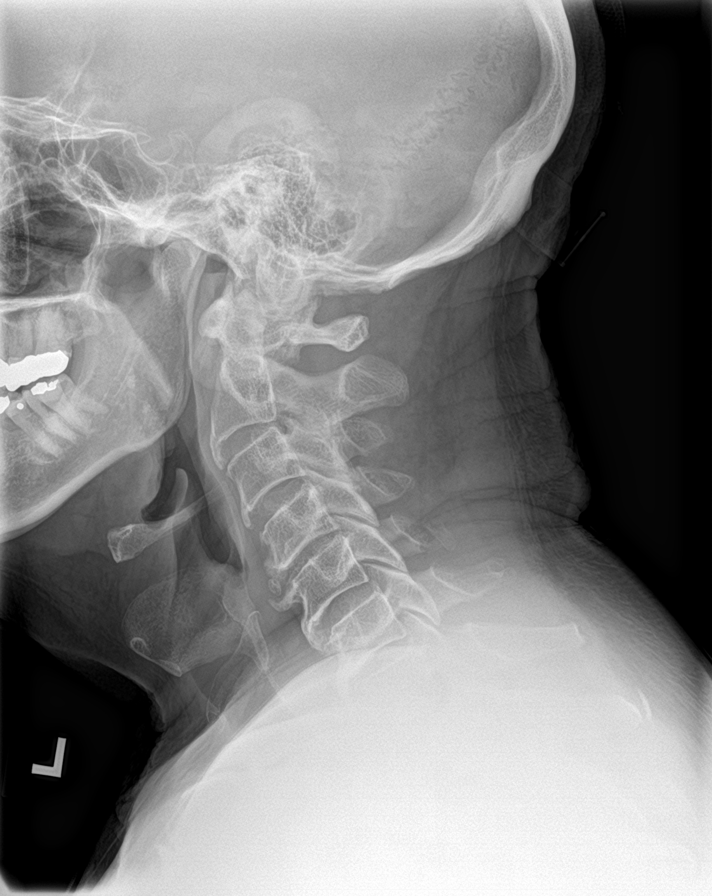
[im 2/7]
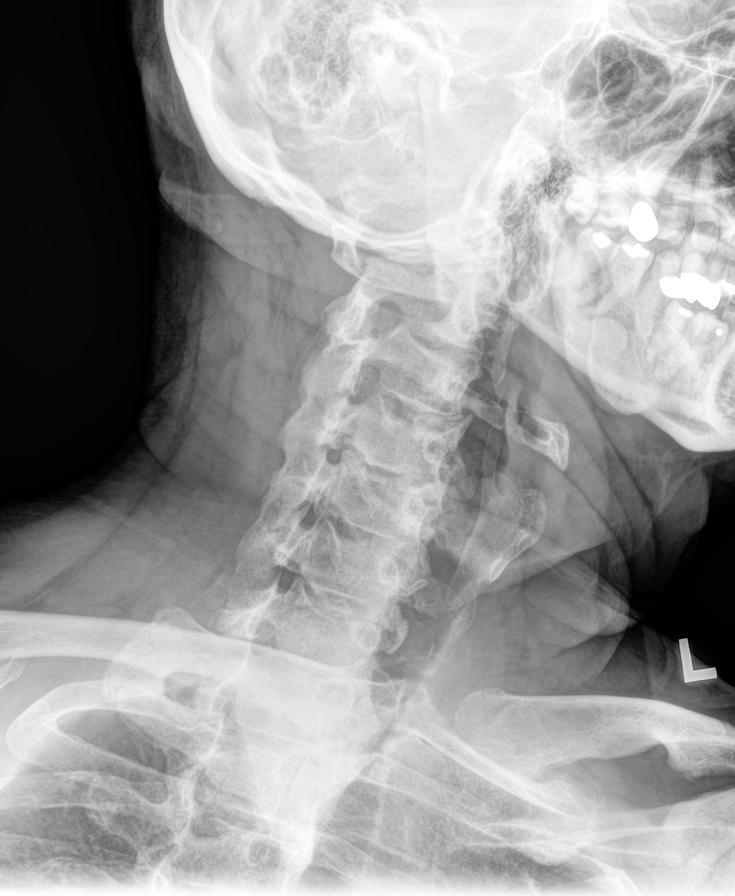
[im 3/7]
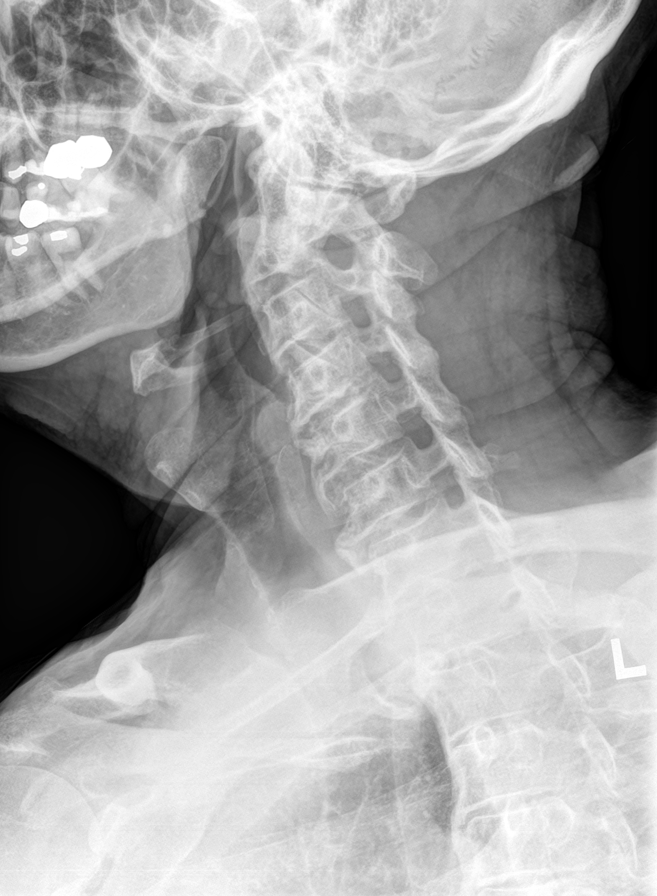
[im 4/7]
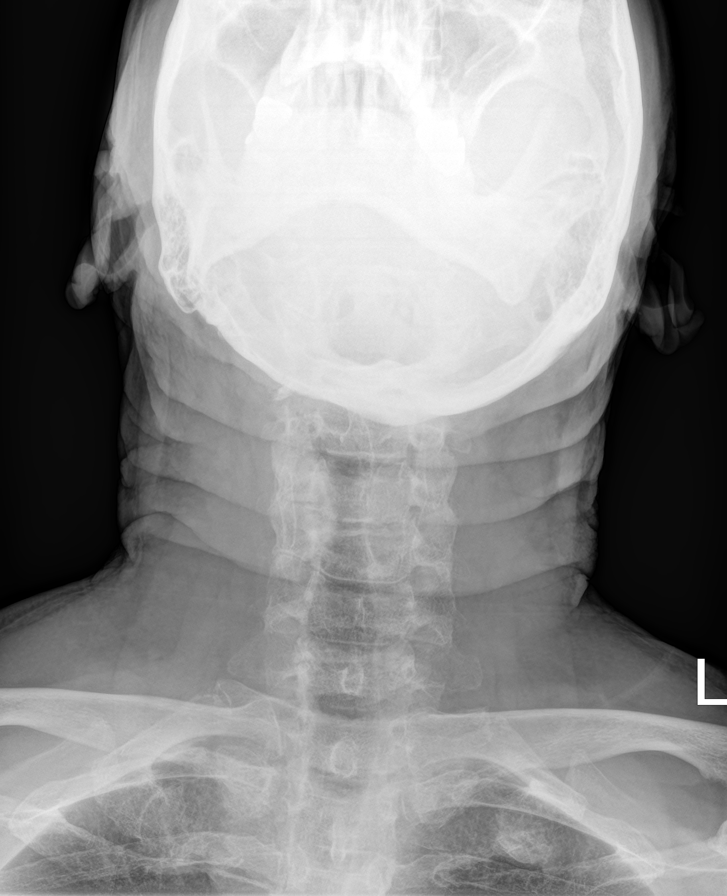
[im 5/7]
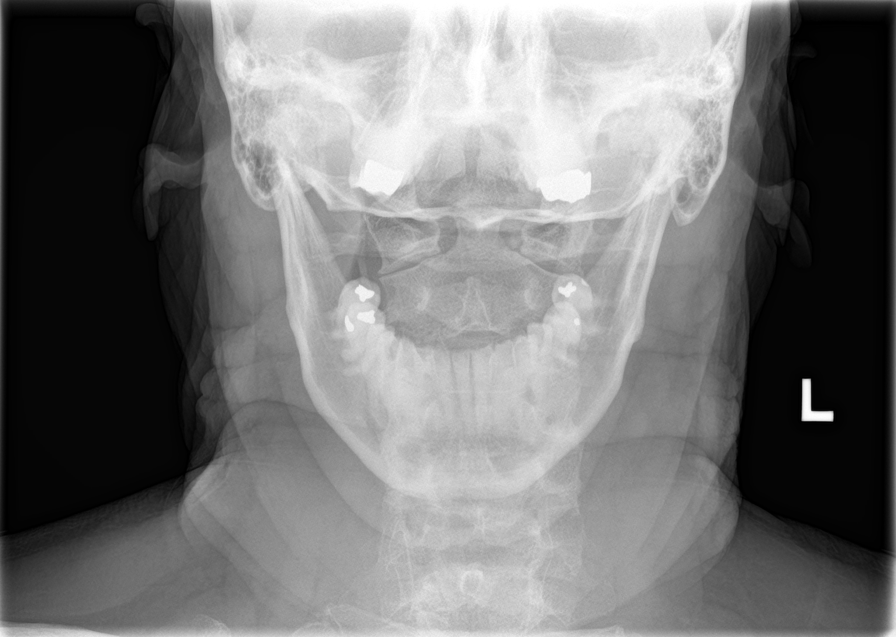
[im 6/7]
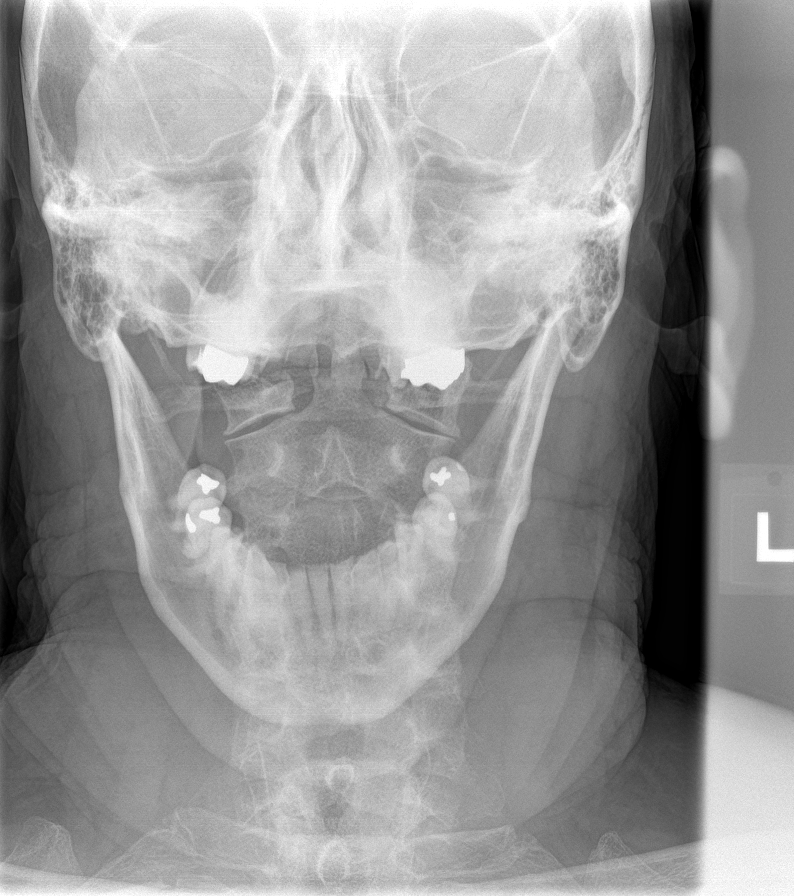
[im 7/7]
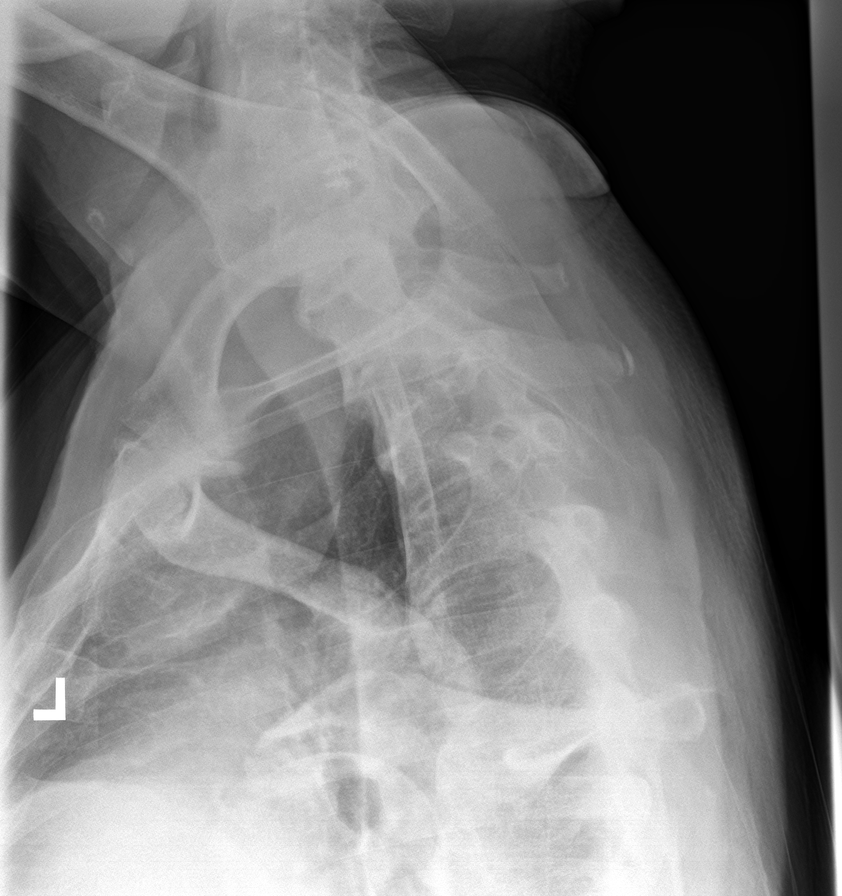

[7 of 7 positions shown; findings below may reference images not displayed]

FINDINGS: No fracture, bone lesion or spondylolisthesis. Generalized
straightening of the normal cervical lordosis. Mild loss of disc
height at C4-C5. Remaining disc spaces are well preserved. There are
endplate osteophytes from C2 through the upper thoracic spine.

The neural foramina well preserved.

Soft tissues are unremarkable.
IMPRESSION: 1. No fracture or acute finding.  No spondylolisthesis.
2. Disc degenerative changes as described.

## 2022-03-15 DIAGNOSIS — E119 Type 2 diabetes mellitus without complications: Secondary | ICD-10-CM | POA: Diagnosis not present

## 2022-03-15 DIAGNOSIS — Z0001 Encounter for general adult medical examination with abnormal findings: Secondary | ICD-10-CM | POA: Diagnosis not present

## 2022-03-16 LAB — CMP14+EGFR
ALT: 23 IU/L (ref 0–44)
AST: 26 IU/L (ref 0–40)
Albumin/Globulin Ratio: 2.1 (ref 1.2–2.2)
Albumin: 4.6 g/dL (ref 3.8–4.8)
Alkaline Phosphatase: 127 IU/L — ABNORMAL HIGH (ref 44–121)
BUN/Creatinine Ratio: 14 (ref 10–24)
BUN: 14 mg/dL (ref 8–27)
Bilirubin Total: 0.7 mg/dL (ref 0.0–1.2)
CO2: 22 mmol/L (ref 20–29)
Calcium: 9.6 mg/dL (ref 8.6–10.2)
Chloride: 100 mmol/L (ref 96–106)
Creatinine, Ser: 0.97 mg/dL (ref 0.76–1.27)
Globulin, Total: 2.2 g/dL (ref 1.5–4.5)
Glucose: 116 mg/dL — ABNORMAL HIGH (ref 70–99)
Potassium: 5.4 mmol/L — ABNORMAL HIGH (ref 3.5–5.2)
Sodium: 137 mmol/L (ref 134–144)
Total Protein: 6.8 g/dL (ref 6.0–8.5)
eGFR: 83 mL/min/{1.73_m2} (ref >59)

## 2022-03-16 LAB — CBC WITH DIFFERENTIAL/PLATELET
Basophils Absolute: 0.1 10*3/uL (ref 0.0–0.2)
Basos: 1 %
EOS (ABSOLUTE): 0.1 10*3/uL (ref 0.0–0.4)
Eos: 2 %
Hematocrit: 43.1 % (ref 37.5–51.0)
Hemoglobin: 14.7 g/dL (ref 13.0–17.7)
Immature Grans (Abs): 0 10*3/uL (ref 0.0–0.1)
Immature Granulocytes: 0 %
Lymphocytes Absolute: 2.4 10*3/uL (ref 0.7–3.1)
Lymphs: 41 %
MCH: 32.1 pg (ref 26.6–33.0)
MCHC: 34.1 g/dL (ref 31.5–35.7)
MCV: 94 fL (ref 79–97)
Monocytes Absolute: 0.5 10*3/uL (ref 0.1–0.9)
Monocytes: 8 %
Neutrophils Absolute: 2.8 10*3/uL (ref 1.4–7.0)
Neutrophils: 48 %
Platelets: 262 10*3/uL (ref 150–450)
RBC: 4.58 x10E6/uL (ref 4.14–5.80)
RDW: 13.1 % (ref 11.6–15.4)
WBC: 5.8 10*3/uL (ref 3.4–10.8)

## 2022-03-16 LAB — LIPID PANEL
Chol/HDL Ratio: 2.2 ratio (ref 0.0–5.0)
Cholesterol, Total: 113 mg/dL (ref 100–199)
HDL: 51 mg/dL (ref >39)
LDL Chol Calc (NIH): 43 mg/dL (ref 0–99)
Triglycerides: 101 mg/dL (ref 0–149)
VLDL Cholesterol Cal: 19 mg/dL (ref 5–40)

## 2022-03-19 ENCOUNTER — Telehealth: Payer: Self-pay

## 2022-03-19 ENCOUNTER — Other Ambulatory Visit: Payer: Self-pay

## 2022-03-19 DIAGNOSIS — L219 Seborrheic dermatitis, unspecified: Secondary | ICD-10-CM

## 2022-03-19 MED ORDER — DOXYCYCLINE HYCLATE 100 MG PO TABS: 100.0000 mg | ORAL_TABLET | Freq: Two times a day (BID) | ORAL | 0 refills | Status: DC

## 2022-03-19 NOTE — Telephone Encounter (Signed)
Pt called that he having sinus infection,chills  and coughing and covid test is negative as per alyssa send doxycycline to [facility]

## 2022-05-06 ENCOUNTER — Encounter: Payer: Self-pay | Admitting: Nurse Practitioner

## 2022-05-06 ENCOUNTER — Ambulatory Visit (INDEPENDENT_AMBULATORY_CARE_PROVIDER_SITE_OTHER): Payer: Medicare HMO | Admitting: Nurse Practitioner

## 2022-05-06 VITALS — BP 143/72 | HR 75 | Temp 97.7°F | Resp 16 | Ht 70.0 in | Wt 212.8 lb

## 2022-05-06 DIAGNOSIS — M5136 Other intervertebral disc degeneration, lumbar region: Secondary | ICD-10-CM

## 2022-05-06 DIAGNOSIS — E119 Type 2 diabetes mellitus without complications: Secondary | ICD-10-CM

## 2022-05-06 DIAGNOSIS — I10 Essential (primary) hypertension: Secondary | ICD-10-CM | POA: Diagnosis not present

## 2022-05-06 DIAGNOSIS — Z79899 Other long term (current) drug therapy: Secondary | ICD-10-CM

## 2022-05-06 LAB — POCT GLYCOSYLATED HEMOGLOBIN (HGB A1C): Hemoglobin A1C: 6.3 % — AB (ref 4.0–5.6)

## 2022-05-06 MED ORDER — OXYCODONE HCL 5 MG PO TABS: 5.0000 mg | ORAL_TABLET | Freq: Two times a day (BID) | ORAL | 0 refills | Status: DC | PRN

## 2022-05-06 MED ORDER — FOLIC ACID 1 MG PO TABS: ORAL_TABLET | ORAL | 1 refills | Status: DC

## 2022-05-06 MED ORDER — AMLODIPINE BESYLATE 2.5 MG PO TABS: 2.5000 mg | ORAL_TABLET | Freq: Every day | ORAL | 1 refills | Status: DC

## 2022-05-06 MED ORDER — TRAMADOL HCL 50 MG PO TABS: 50.0000 mg | ORAL_TABLET | Freq: Two times a day (BID) | ORAL | 2 refills | Status: DC

## 2022-05-06 MED ORDER — ALBUTEROL SULFATE HFA 108 (90 BASE) MCG/ACT IN AERS: 2.0000 | INHALATION_SPRAY | Freq: Four times a day (QID) | RESPIRATORY_TRACT | 5 refills | Status: DC | PRN

## 2022-05-06 MED ORDER — FLUTICASONE PROPIONATE 50 MCG/ACT NA SUSP: 2.0000 | Freq: Every day | NASAL | 5 refills | Status: AC

## 2022-05-06 MED ORDER — METHOTREXATE SODIUM 2.5 MG PO TABS: 15.0000 mg | ORAL_TABLET | ORAL | 1 refills | Status: DC

## 2022-05-06 NOTE — Progress Notes (Signed)
[facility] [place], [place] [address]  Internal MEDICINE  Office Visit Note  Patient Name: Russell Sullivan  [id number]  [id number]  Date of Service: 05/06/2022  Chief Complaint  Patient presents with   Follow-up   Hyperlipidemia   Hypertension   Diabetes    HPI Russell Sullivan presents for a follow-up visit for prediabetes, chronic pain and medication refills   Prediabetes -- A1c no change, stable Rhinitis -- wants refill of nasal spray Chronic pain - need med refills Hypertension -- ok with current medications    Current Medication: Outpatient Encounter Medications as of 05/06/2022  Medication Sig   Accu-Chek Softclix Lancets lancets Use to check blood sugars twice a week E11.65   aspirin 81 MG EC tablet Take by mouth.   glucose blood (ACCU-CHEK GUIDE) test strip check blood sugars twice a day E11.65   hydrocortisone ointment 0.5 % Apply 1 application topically 2 (two) times daily. Prn only for eczema   meloxicam (MOBIC) 15 MG tablet Take 1 tablet (15 mg total) by mouth daily.   pantoprazole (PROTONIX) 40 MG tablet Take 2 tablets (80 mg total) by mouth daily.   rosuvastatin (CRESTOR) 20 MG tablet Take 1 tablet (20 mg total) by mouth at bedtime.   triamcinolone cream (KENALOG) 0.1 % Apply 1 Application topically 2 (two) times daily. To affected area until resolved.   [DISCONTINUED] albuterol (VENTOLIN HFA) 108 (90 Base) MCG/ACT inhaler Inhale 2 puffs into the lungs every 6 (six) hours as needed for wheezing or shortness of breath.   [DISCONTINUED] amLODipine (NORVASC) 2.5 MG tablet Take 1 tablet (2.5 mg total) by mouth daily.   [DISCONTINUED] doxycycline (VIBRA-TABS) 100 MG tablet Take 1 tablet (100 mg total) by mouth 2 (two) times daily.   [DISCONTINUED] fluticasone (FLONASE) 50 MCG/ACT nasal spray Place 2 sprays into both nostrils daily.   [DISCONTINUED] folic acid (FOLVITE) 1 MG tablet folic acid 1 mg tablet  TAKE 1 TABLET BY MOUTH ONCE DAILY    [DISCONTINUED] methotrexate 2.5 MG tablet Take 6 tablets (15 mg total) by mouth once a week. Caution:Chemotherapy. Protect from light.   [DISCONTINUED] oxyCODONE (OXY IR/ROXICODONE) 5 MG immediate release tablet Take 1 tablet (5 mg total) by mouth 2 (two) times daily as needed for severe pain.   [DISCONTINUED] oxyCODONE (OXY IR/ROXICODONE) 5 MG immediate release tablet Take 1 tablet (5 mg total) by mouth 2 (two) times daily as needed for severe pain.   [DISCONTINUED] oxyCODONE (OXY IR/ROXICODONE) 5 MG immediate release tablet Take 1 tablet (5 mg total) by mouth 2 (two) times daily as needed for severe pain.   [DISCONTINUED] solifenacin (VESICARE) 5 MG tablet Take 1 tablet (5 mg total) by mouth daily.   [DISCONTINUED] traMADol (ULTRAM) 50 MG tablet Take 1 tablet (50 mg total) by mouth 2 (two) times daily.   albuterol (VENTOLIN HFA) 108 (90 Base) MCG/ACT inhaler Inhale 2 puffs into the lungs every 6 (six) hours as needed for wheezing or shortness of breath.   amLODipine (NORVASC) 2.5 MG tablet Take 1 tablet (2.5 mg total) by mouth daily.   fluticasone (FLONASE) 50 MCG/ACT nasal spray Place 2 sprays into both nostrils daily.   folic acid (FOLVITE) 1 MG tablet folic acid 1 mg tablet  TAKE 1 TABLET BY MOUTH ONCE DAILY   methotrexate (RHEUMATREX) 2.5 MG tablet Take 6 tablets (15 mg total) by mouth once a week. Caution:Chemotherapy. Protect from light.   oxyCODONE (OXY IR/ROXICODONE) 5 MG immediate release tablet Take 1 tablet (5  mg total) by mouth 2 (two) times daily as needed for severe pain.   [START ON 06/03/2022] oxyCODONE (OXY IR/ROXICODONE) 5 MG immediate release tablet Take 1 tablet (5 mg total) by mouth 2 (two) times daily as needed for severe pain.   [START ON 07/01/2022] oxyCODONE (OXY IR/ROXICODONE) 5 MG immediate release tablet Take 1 tablet (5 mg total) by mouth 2 (two) times daily as needed for severe pain.   traMADol (ULTRAM) 50 MG tablet Take 1 tablet (50 mg total) by mouth 2 (two) times  daily.   [DISCONTINUED] folic acid (FOLVITE) 1 MG tablet folic acid 1 mg tablet  TAKE 1 TABLET BY MOUTH ONCE DAILY   No facility-administered encounter medications on file as of 05/06/2022.    Surgical History: Past Surgical History:  Procedure Laterality Date   HERNIA REPAIR  2009   INTUBATION-ENDOTRACHEAL WITH TRACHEOSTOMY STANDBY N/A 01/13/2018   Procedure: INTUBATION-ENDOTRACHEAL WITH TRACHEOSTOMY STANDBY;  Surgeon: Beverly Gust, MD;  Location: [facility] ORS;  Service: ENT;  Laterality: N/A;   left shoulder surgery      Medical History: Past Medical History:  Diagnosis Date   Adenocarcinoma of prostate ([place])    Diabetes mellitus without complication ([place])    Hyperlipidemia    Hypertension     Family History: Family History  Problem Relation Age of Onset   Hypertension Mother    Diabetes Mother    Hypertension Father    Diabetes Maternal Grandmother    Diabetes Paternal Grandmother     Social History   Socioeconomic History   Marital status: Married    Spouse name: Not on file   Number of children: Not on file   Years of education: Not on file   Highest education level: Not on file  Occupational History   Not on file  Tobacco Use   Smoking status: Former    Types: Cigarettes   Smokeless tobacco: Never   Tobacco comments:    20 year ago  Substance and Sexual Activity   Alcohol use: Yes    Comment: ocassionally   Drug use: No   Sexual activity: Not on file  Other Topics Concern   Not on file  Social History Narrative   Not on file   Social Determinants of Health   Financial Resource Strain: Not on file  Food Insecurity: Not on file  Transportation Needs: Not on file  Physical Activity: Not on file  Stress: Not on file  Social Connections: Not on file  Intimate Partner Violence: Not on file      Review of Systems  Constitutional:  Negative for chills, fatigue and unexpected weight change.  HENT:  Negative for congestion, rhinorrhea, sneezing  and sore throat.   Eyes:  Negative for redness.  Respiratory:  Negative for cough, chest tightness and shortness of breath.   Cardiovascular:  Negative for chest pain and palpitations.  Gastrointestinal:  Negative for abdominal pain, constipation, diarrhea, nausea and vomiting.  Genitourinary:  Negative for dysuria and frequency.  Musculoskeletal:  Positive for arthralgias. Negative for back pain, joint swelling and neck pain.  Skin:  Negative for rash.  Neurological: Negative.  Negative for tremors and numbness.  Hematological:  Negative for adenopathy. Does not bruise/bleed easily.  Psychiatric/Behavioral:  Negative for behavioral problems (Depression), sleep disturbance and suicidal ideas. The patient is not nervous/anxious.     Vital Signs: BP (!) 143/72   Pulse 75   Temp 97.7 F (36.5 C)   Resp 16   Ht '5\' 10"'$  (1.778  m)   Wt 212 lb 12.8 oz (96.5 kg)   SpO2 98%   BMI 30.53 kg/m    Physical Exam Vitals reviewed.  Constitutional:      General: He is not in acute distress.    Appearance: Normal appearance. He is not ill-appearing.  HENT:     Head: Normocephalic and atraumatic.  Eyes:     Pupils: Pupils are equal, round, and reactive to light.  Cardiovascular:     Rate and Rhythm: Normal rate and regular rhythm.  Pulmonary:     Effort: Pulmonary effort is normal. No respiratory distress.  Neurological:     Mental Status: He is alert and oriented to person, place, and time.  Psychiatric:        Mood and Affect: Mood normal.        Behavior: Behavior normal.       Assessment/Plan: 1. Type 2 diabetes mellitus without complication, without long-term current use of insulin (HCC) A1c stable, no change, no changes  - POCT glycosylated hemoglobin (Hb A1C)  2. Degenerative disc disease, lumbar Refills x3 months of oxycodone, follow up in 3 month for additional refills.  - oxyCODONE (OXY IR/ROXICODONE) 5 MG immediate release tablet; Take 1 tablet (5 mg total) by mouth 2  (two) times daily as needed for severe pain.  Dispense: 60 tablet; Refill: 0 - oxyCODONE (OXY IR/ROXICODONE) 5 MG immediate release tablet; Take 1 tablet (5 mg total) by mouth 2 (two) times daily as needed for severe pain.  Dispense: 60 tablet; Refill: 0 - oxyCODONE (OXY IR/ROXICODONE) 5 MG immediate release tablet; Take 1 tablet (5 mg total) by mouth 2 (two) times daily as needed for severe pain.  Dispense: 60 tablet; Refill: 0 - traMADol (ULTRAM) 50 MG tablet; Take 1 tablet (50 mg total) by mouth 2 (two) times daily.  Dispense: 60 tablet; Refill: 2  3. Essential (primary) hypertension Stable, continue amlodipine as prescribed.  - amLODipine (NORVASC) 2.5 MG tablet; Take 1 tablet (2.5 mg total) by mouth daily.  Dispense: 90 tablet; Refill: 1  4. Encounter for medication review Medication list reviewed with patient. Needed refills ordered.  - fluticasone (FLONASE) 50 MCG/ACT nasal spray; Place 2 sprays into both nostrils daily.  Dispense: 16 g; Refill: 5 - methotrexate (RHEUMATREX) 2.5 MG tablet; Take 6 tablets (15 mg total) by mouth once a week. Caution:Chemotherapy. Protect from light.  Dispense: 84 tablet; Refill: 1 - amLODipine (NORVASC) 2.5 MG tablet; Take 1 tablet (2.5 mg total) by mouth daily.  Dispense: 90 tablet; Refill: 1 - albuterol (VENTOLIN HFA) 108 (90 Base) MCG/ACT inhaler; Inhale 2 puffs into the lungs every 6 (six) hours as needed for wheezing or shortness of breath.  Dispense: 18 g; Refill: 5 - folic acid (FOLVITE) 1 MG tablet; folic acid 1 mg tablet  TAKE 1 TABLET BY MOUTH ONCE DAILY  Dispense: 90 tablet; Refill: 1   General Counseling: [place] verbalizes understanding of the findings of todays visit and agrees with plan of treatment. I have discussed any further diagnostic evaluation that may be needed or ordered today. We also reviewed his medications today. he has been encouraged to call the office with any questions or concerns that should arise related to todays  visit.    Orders Placed This Encounter  Procedures   POCT glycosylated hemoglobin (Hb A1C)    Meds ordered this encounter  Medications   fluticasone (FLONASE) 50 MCG/ACT nasal spray    Sig: Place 2 sprays into both nostrils daily.  Dispense:  16 g    Refill:  5   oxyCODONE (OXY IR/ROXICODONE) 5 MG immediate release tablet    Sig: Take 1 tablet (5 mg total) by mouth 2 (two) times daily as needed for severe pain.    Dispense:  60 tablet    Refill:  0   oxyCODONE (OXY IR/ROXICODONE) 5 MG immediate release tablet    Sig: Take 1 tablet (5 mg total) by mouth 2 (two) times daily as needed for severe pain.    Dispense:  60 tablet    Refill:  0    Fill for february   oxyCODONE (OXY IR/ROXICODONE) 5 MG immediate release tablet    Sig: Take 1 tablet (5 mg total) by mouth 2 (two) times daily as needed for severe pain.    Dispense:  60 tablet    Refill:  0    Fill for march   traMADol (ULTRAM) 50 MG tablet    Sig: Take 1 tablet (50 mg total) by mouth 2 (two) times daily.    Dispense:  60 tablet    Refill:  2   methotrexate (RHEUMATREX) 2.5 MG tablet    Sig: Take 6 tablets (15 mg total) by mouth once a week. Caution:Chemotherapy. Protect from light.    Dispense:  84 tablet    Refill:  1   DISCONTD: folic acid (FOLVITE) 1 MG tablet    Sig: folic acid 1 mg tablet  TAKE 1 TABLET BY MOUTH ONCE DAILY    Dispense:  90 tablet    Refill:  1   amLODipine (NORVASC) 2.5 MG tablet    Sig: Take 1 tablet (2.5 mg total) by mouth daily.    Dispense:  90 tablet    Refill:  1   albuterol (VENTOLIN HFA) 108 (90 Base) MCG/ACT inhaler    Sig: Inhale 2 puffs into the lungs every 6 (six) hours as needed for wheezing or shortness of breath.    Dispense:  18 g    Refill:  5   folic acid (FOLVITE) 1 MG tablet    Sig: folic acid 1 mg tablet  TAKE 1 TABLET BY MOUTH ONCE DAILY    Dispense:  90 tablet    Refill:  1    Return in about 3 months (around 07/28/2022) for F/U, Recheck A1C, Asiah Browder PCP, pain  med refill.   Total time spent:30 Minutes Time spent includes review of chart, medications, test results, and follow up plan with the patient.   [place] Controlled Substance Database was reviewed by me.  This patient was seen by [name], FNP-C in collaboration with Dr. Clayborn Bigness as a part of collaborative care agreement.   Shantil Vallejo R. Valetta Fuller, MSN, FNP-C Internal medicine

## 2022-05-07 ENCOUNTER — Encounter: Payer: Self-pay | Admitting: Nurse Practitioner

## 2022-06-07 DIAGNOSIS — C61 Malignant neoplasm of prostate: Secondary | ICD-10-CM | POA: Diagnosis not present

## 2022-08-09 ENCOUNTER — Ambulatory Visit (INDEPENDENT_AMBULATORY_CARE_PROVIDER_SITE_OTHER): Payer: Medicare HMO | Admitting: Nurse Practitioner

## 2022-08-09 ENCOUNTER — Encounter: Payer: Self-pay | Admitting: Nurse Practitioner

## 2022-08-09 VITALS — BP 138/62 | HR 78 | Temp 98.7°F | Resp 16 | Ht 70.0 in | Wt 210.6 lb

## 2022-08-09 DIAGNOSIS — E119 Type 2 diabetes mellitus without complications: Secondary | ICD-10-CM

## 2022-08-09 DIAGNOSIS — Z1211 Encounter for screening for malignant neoplasm of colon: Secondary | ICD-10-CM | POA: Diagnosis not present

## 2022-08-09 DIAGNOSIS — Z79899 Other long term (current) drug therapy: Secondary | ICD-10-CM

## 2022-08-09 DIAGNOSIS — M5136 Other intervertebral disc degeneration, lumbar region: Secondary | ICD-10-CM | POA: Diagnosis not present

## 2022-08-09 DIAGNOSIS — Z1212 Encounter for screening for malignant neoplasm of rectum: Secondary | ICD-10-CM

## 2022-08-09 DIAGNOSIS — L219 Seborrheic dermatitis, unspecified: Secondary | ICD-10-CM

## 2022-08-09 LAB — POCT GLYCOSYLATED HEMOGLOBIN (HGB A1C): Hemoglobin A1C: 6.3 % — AB (ref 4.0–5.6)

## 2022-08-09 MED ORDER — MOMETASONE FUROATE 0.1 % EX CREA
1.0000 | TOPICAL_CREAM | Freq: Every day | CUTANEOUS | 0 refills | Status: DC
Start: 2022-08-09 — End: 2023-07-28

## 2022-08-09 MED ORDER — OXYCODONE HCL 5 MG PO TABS: 5.0000 mg | ORAL_TABLET | Freq: Two times a day (BID) | ORAL | 0 refills | Status: DC | PRN

## 2022-08-09 MED ORDER — ROSUVASTATIN CALCIUM 20 MG PO TABS: 20.0000 mg | ORAL_TABLET | Freq: Every day | ORAL | 3 refills | Status: DC

## 2022-08-09 MED ORDER — PANTOPRAZOLE SODIUM 40 MG PO TBEC
80.0000 mg | DELAYED_RELEASE_TABLET | Freq: Every day | ORAL | 3 refills | Status: DC
Start: 2022-08-09 — End: 2023-07-28

## 2022-08-09 MED ORDER — MELOXICAM 15 MG PO TABS
15.0000 mg | ORAL_TABLET | Freq: Every day | ORAL | 3 refills | Status: DC
Start: 2022-08-09 — End: 2023-07-28

## 2022-08-09 MED ORDER — METFORMIN HCL 500 MG PO TABS
500.0000 mg | ORAL_TABLET | Freq: Every day | ORAL | 3 refills | Status: DC
Start: 2022-08-09 — End: 2023-05-27

## 2022-08-09 MED ORDER — TRAMADOL HCL 50 MG PO TABS: 50.0000 mg | ORAL_TABLET | Freq: Two times a day (BID) | ORAL | 2 refills | Status: DC

## 2022-08-09 NOTE — Progress Notes (Signed)
[facility] [address] [place], [place] [address]  Internal MEDICINE  Office Visit Note  Patient Name: Russell Sullivan  [id number]  [id number]  Date of Service: 08/09/2022  Chief Complaint  Patient presents with   Diabetes   Hypertension   Hyperlipidemia    HPI Russell Sullivan presents for a follow-up visit for diabetes, dermatitis, medication refills.  Diabetes -- A1c 6.3, no change, remains stable, started taking metformin again due to increased glucose readings.  Dermatitis of scalp -- triamcinolone not helping, wanting a different topical.  Blood pressure elevated, improved when rechecked. Med refills, review med list.     Current Medication: Outpatient Encounter Medications as of 08/09/2022  Medication Sig   Accu-Chek Softclix Lancets lancets Use to check blood sugars twice a week E11.65   albuterol (VENTOLIN HFA) 108 (90 Base) MCG/ACT inhaler Inhale 2 puffs into the lungs every 6 (six) hours as needed for wheezing or shortness of breath.   amLODipine (NORVASC) 2.5 MG tablet Take 1 tablet (2.5 mg total) by mouth daily.   aspirin 81 MG EC tablet Take by mouth.   fluticasone (FLONASE) 50 MCG/ACT nasal spray Place 2 sprays into both nostrils daily.   folic acid (FOLVITE) 1 MG tablet folic acid 1 mg tablet  TAKE 1 TABLET BY MOUTH ONCE DAILY   glucose blood (ACCU-CHEK GUIDE) test strip check blood sugars twice a day E11.65   hydrocortisone ointment 0.5 % Apply 1 application topically 2 (two) times daily. Prn only for eczema   methotrexate (RHEUMATREX) 2.5 MG tablet Take 6 tablets (15 mg total) by mouth once a week. Caution:Chemotherapy. Protect from light.   triamcinolone cream (KENALOG) 0.1 % Apply 1 Application topically 2 (two) times daily. To affected area until resolved.   [DISCONTINUED] meloxicam (MOBIC) 15 MG tablet Take 1 tablet (15 mg total) by mouth daily.   [DISCONTINUED] oxyCODONE (OXY IR/ROXICODONE) 5 MG immediate release tablet Take 1 tablet (5 mg total) by  mouth 2 (two) times daily as needed for severe pain.   [DISCONTINUED] oxyCODONE (OXY IR/ROXICODONE) 5 MG immediate release tablet Take 1 tablet (5 mg total) by mouth 2 (two) times daily as needed for severe pain.   [DISCONTINUED] oxyCODONE (OXY IR/ROXICODONE) 5 MG immediate release tablet Take 1 tablet (5 mg total) by mouth 2 (two) times daily as needed for severe pain.   [DISCONTINUED] pantoprazole (PROTONIX) 40 MG tablet Take 2 tablets (80 mg total) by mouth daily.   [DISCONTINUED] rosuvastatin (CRESTOR) 20 MG tablet Take 1 tablet (20 mg total) by mouth at bedtime.   [DISCONTINUED] traMADol (ULTRAM) 50 MG tablet Take 1 tablet (50 mg total) by mouth 2 (two) times daily.   meloxicam (MOBIC) 15 MG tablet Take 1 tablet (15 mg total) by mouth daily.   metFORMIN (GLUCOPHAGE) 500 MG tablet Take 1 tablet (500 mg total) by mouth daily with breakfast.   mometasone (ELOCON) 0.1 % cream Apply 1 Application topically daily.   oxyCODONE (OXY IR/ROXICODONE) 5 MG immediate release tablet Take 1 tablet (5 mg total) by mouth 2 (two) times daily as needed for severe pain.   [START ON 09/06/2022] oxyCODONE (OXY IR/ROXICODONE) 5 MG immediate release tablet Take 1 tablet (5 mg total) by mouth 2 (two) times daily as needed for severe pain.   [START ON 10/04/2022] oxyCODONE (OXY IR/ROXICODONE) 5 MG immediate release tablet Take 1 tablet (5 mg total) by mouth 2 (two) times daily as needed for severe pain.   pantoprazole (PROTONIX) 40 MG tablet Take 2 tablets (  80 mg total) by mouth daily.   rosuvastatin (CRESTOR) 20 MG tablet Take 1 tablet (20 mg total) by mouth at bedtime.   traMADol (ULTRAM) 50 MG tablet Take 1 tablet (50 mg total) by mouth 2 (two) times daily.   No facility-administered encounter medications on file as of 08/09/2022.    Surgical History: Past Surgical History:  Procedure Laterality Date   HERNIA REPAIR  2009   INTUBATION-ENDOTRACHEAL WITH TRACHEOSTOMY STANDBY N/A 01/13/2018   Procedure:  INTUBATION-ENDOTRACHEAL WITH TRACHEOSTOMY STANDBY;  Surgeon: Linus Salmons, MD;  Location: [facility] ORS;  Service: ENT;  Laterality: N/A;   left shoulder surgery      Medical History: Past Medical History:  Diagnosis Date   Adenocarcinoma of prostate (HCC)    Diabetes mellitus without complication (HCC)    Hyperlipidemia    Hypertension     Family History: Family History  Problem Relation Age of Onset   Hypertension Mother    Diabetes Mother    Hypertension Father    Diabetes Maternal Grandmother    Diabetes Paternal Grandmother     Social History   Socioeconomic History   Marital status: Married    Spouse name: Not on file   Number of children: Not on file   Years of education: Not on file   Highest education level: Not on file  Occupational History   Not on file  Tobacco Use   Smoking status: Former    Types: Cigarettes   Smokeless tobacco: Never   Tobacco comments:    20 year ago  Substance and Sexual Activity   Alcohol use: Yes    Comment: ocassionally   Drug use: No   Sexual activity: Not on file  Other Topics Concern   Not on file  Social History Narrative   Not on file   Social Determinants of Health   Financial Resource Strain: Not on file  Food Insecurity: Not on file  Transportation Needs: Not on file  Physical Activity: Not on file  Stress: Not on file  Social Connections: Not on file  Intimate Partner Violence: Not on file      Review of Systems  Constitutional:  Negative for chills, fatigue and unexpected weight change.  HENT:  Negative for congestion, rhinorrhea, sneezing and sore throat.   Eyes:  Negative for redness.  Respiratory:  Negative for cough, chest tightness and shortness of breath.   Cardiovascular:  Negative for chest pain and palpitations.  Gastrointestinal:  Negative for abdominal pain, constipation, diarrhea, nausea and vomiting.  Genitourinary:  Negative for dysuria and frequency.  Musculoskeletal:  Positive for  arthralgias. Negative for back pain, joint swelling and neck pain.  Skin:  Negative for rash.  Neurological: Negative.  Negative for tremors and numbness.  Hematological:  Negative for adenopathy. Does not bruise/bleed easily.  Psychiatric/Behavioral:  Negative for behavioral problems (Depression), sleep disturbance and suicidal ideas. The patient is not nervous/anxious.     Vital Signs: BP 138/62 Comment: 165/84  Pulse 78   Temp 98.7 F (37.1 C)   Resp 16   Ht 5\' 10"  (1.778 m)   Wt 210 lb 9.6 oz (95.5 kg)   SpO2 97%   BMI 30.22 kg/m    Physical Exam Vitals reviewed.  Constitutional:      General: He is not in acute distress.    Appearance: Normal appearance. He is not ill-appearing.  HENT:     Head: Normocephalic and atraumatic.  Eyes:     Pupils: Pupils are equal, round, and  reactive to light.  Cardiovascular:     Rate and Rhythm: Normal rate and regular rhythm.  Pulmonary:     Effort: Pulmonary effort is normal. No respiratory distress.  Neurological:     Mental Status: He is alert and oriented to person, place, and time.  Psychiatric:        Mood and Affect: Mood normal.        Behavior: Behavior normal.        Assessment/Plan: 1. Type 2 diabetes mellitus without complication, without long-term current use of insulin (HCC) Stable, no change, continue metformin as prescribed.  - POCT glycosylated hemoglobin (Hb A1C) - metFORMIN (GLUCOPHAGE) 500 MG tablet; Take 1 tablet (500 mg total) by mouth daily with breakfast.  Dispense: 180 tablet; Refill: 3  2. Seborrheic dermatitis of scalp Try mometasone as prescribed instead of triamcinolone - mometasone (ELOCON) 0.1 % cream; Apply 1 Application topically daily.  Dispense: 45 g; Refill: 0  3. Degenerative disc disease, lumbar Continue oxycodone as prescribed  - oxyCODONE (OXY IR/ROXICODONE) 5 MG immediate release tablet; Take 1 tablet (5 mg total) by mouth 2 (two) times daily as needed for severe pain.  Dispense: 60  tablet; Refill: 0 - oxyCODONE (OXY IR/ROXICODONE) 5 MG immediate release tablet; Take 1 tablet (5 mg total) by mouth 2 (two) times daily as needed for severe pain.  Dispense: 60 tablet; Refill: 0 - oxyCODONE (OXY IR/ROXICODONE) 5 MG immediate release tablet; Take 1 tablet (5 mg total) by mouth 2 (two) times daily as needed for severe pain.  Dispense: 60 tablet; Refill: 0 - traMADol (ULTRAM) 50 MG tablet; Take 1 tablet (50 mg total) by mouth 2 (two) times daily.  Dispense: 60 tablet; Refill: 2  4. Encounter for medication review Medication list reviewed, updated and medication refills ordered - rosuvastatin (CRESTOR) 20 MG tablet; Take 1 tablet (20 mg total) by mouth at bedtime.  Dispense: 90 tablet; Refill: 3 - pantoprazole (PROTONIX) 40 MG tablet; Take 2 tablets (80 mg total) by mouth daily.  Dispense: 180 tablet; Refill: 3 - meloxicam (MOBIC) 15 MG tablet; Take 1 tablet (15 mg total) by mouth daily.  Dispense: 90 tablet; Refill: 3  5. Screening for colorectal cancer Cologuard test ordered. - Cologuard   General Counseling: Russell Sullivan verbalizes understanding of the findings of todays visit and agrees with plan of treatment. I have discussed any further diagnostic evaluation that may be needed or ordered today. We also reviewed his medications today. he has been encouraged to call the office with any questions or concerns that should arise related to todays visit.    Orders Placed This Encounter  Procedures   Cologuard   POCT glycosylated hemoglobin (Hb A1C)    Meds ordered this encounter  Medications   oxyCODONE (OXY IR/ROXICODONE) 5 MG immediate release tablet    Sig: Take 1 tablet (5 mg total) by mouth 2 (two) times daily as needed for severe pain.    Dispense:  60 tablet    Refill:  0    Fill for april   oxyCODONE (OXY IR/ROXICODONE) 5 MG immediate release tablet    Sig: Take 1 tablet (5 mg total) by mouth 2 (two) times daily as needed for severe pain.    Dispense:  60 tablet     Refill:  0    Fill for may   oxyCODONE (OXY IR/ROXICODONE) 5 MG immediate release tablet    Sig: Take 1 tablet (5 mg total) by mouth 2 (two) times daily as needed for  severe pain.    Dispense:  60 tablet    Refill:  0    Fill for june   traMADol (ULTRAM) 50 MG tablet    Sig: Take 1 tablet (50 mg total) by mouth 2 (two) times daily.    Dispense:  60 tablet    Refill:  2   rosuvastatin (CRESTOR) 20 MG tablet    Sig: Take 1 tablet (20 mg total) by mouth at bedtime.    Dispense:  90 tablet    Refill:  3   pantoprazole (PROTONIX) 40 MG tablet    Sig: Take 2 tablets (80 mg total) by mouth daily.    Dispense:  180 tablet    Refill:  3   meloxicam (MOBIC) 15 MG tablet    Sig: Take 1 tablet (15 mg total) by mouth daily.    Dispense:  90 tablet    Refill:  3    Please fill as 90 day prescription   metFORMIN (GLUCOPHAGE) 500 MG tablet    Sig: Take 1 tablet (500 mg total) by mouth daily with breakfast.    Dispense:  180 tablet    Refill:  3   mometasone (ELOCON) 0.1 % cream    Sig: Apply 1 Application topically daily.    Dispense:  45 g    Refill:  0    Return in about 3 months (around 10/26/2022) for F/U, pain med refill, Russell Sullivan PCP.   Total time spent:30 Minutes Time spent includes review of chart, medications, test results, and follow up plan with the patient.   [place] Controlled Substance Database was reviewed by me.  This patient was seen by Sallyanne Kuster, FNP-C in collaboration with Dr. Beverely Risen as a part of collaborative care agreement.   Russell Sullivan R. Tedd Sias, MSN, FNP-C Internal medicine

## 2022-08-18 ENCOUNTER — Other Ambulatory Visit: Payer: Self-pay | Admitting: Nurse Practitioner

## 2022-08-18 DIAGNOSIS — M5136 Other intervertebral disc degeneration, lumbar region: Secondary | ICD-10-CM

## 2022-08-18 MED ORDER — OXYCODONE HCL 5 MG PO TABS
5.0000 mg | ORAL_TABLET | Freq: Three times a day (TID) | ORAL | 0 refills | Status: DC | PRN
Start: 2022-08-18 — End: 2022-10-27

## 2022-09-14 ENCOUNTER — Telehealth: Payer: Self-pay

## 2022-09-14 NOTE — Telephone Encounter (Signed)
error 

## 2022-10-26 ENCOUNTER — Ambulatory Visit: Payer: Medicare HMO | Admitting: Nurse Practitioner

## 2022-10-27 ENCOUNTER — Ambulatory Visit (INDEPENDENT_AMBULATORY_CARE_PROVIDER_SITE_OTHER): Payer: Medicare HMO | Admitting: Nurse Practitioner

## 2022-10-27 ENCOUNTER — Encounter: Payer: Self-pay | Admitting: Nurse Practitioner

## 2022-10-27 VITALS — BP 120/78 | HR 79 | Temp 98.2°F | Resp 16 | Ht 70.0 in | Wt 208.0 lb

## 2022-10-27 DIAGNOSIS — L219 Seborrheic dermatitis, unspecified: Secondary | ICD-10-CM

## 2022-10-27 DIAGNOSIS — M5136 Other intervertebral disc degeneration, lumbar region: Secondary | ICD-10-CM

## 2022-10-27 DIAGNOSIS — Z79899 Other long term (current) drug therapy: Secondary | ICD-10-CM

## 2022-10-27 DIAGNOSIS — E782 Mixed hyperlipidemia: Secondary | ICD-10-CM | POA: Diagnosis not present

## 2022-10-27 DIAGNOSIS — E119 Type 2 diabetes mellitus without complications: Secondary | ICD-10-CM

## 2022-10-27 DIAGNOSIS — E559 Vitamin D deficiency, unspecified: Secondary | ICD-10-CM

## 2022-10-27 DIAGNOSIS — E538 Deficiency of other specified B group vitamins: Secondary | ICD-10-CM | POA: Diagnosis not present

## 2022-10-27 MED ORDER — DOXYCYCLINE HYCLATE 100 MG PO TABS
100.0000 mg | ORAL_TABLET | Freq: Two times a day (BID) | ORAL | 0 refills | Status: AC
Start: 2022-10-27 — End: 2022-11-17

## 2022-10-27 MED ORDER — METHOTREXATE SODIUM 2.5 MG PO TABS
15.0000 mg | ORAL_TABLET | ORAL | 1 refills | Status: DC
Start: 2022-10-27 — End: 2023-02-07

## 2022-10-27 MED ORDER — AMLODIPINE BESYLATE 2.5 MG PO TABS
2.5000 mg | ORAL_TABLET | Freq: Every day | ORAL | 1 refills | Status: DC
Start: 2022-10-27 — End: 2023-02-07

## 2022-10-27 MED ORDER — ALBUTEROL SULFATE HFA 108 (90 BASE) MCG/ACT IN AERS
2.0000 | INHALATION_SPRAY | Freq: Four times a day (QID) | RESPIRATORY_TRACT | 5 refills | Status: AC | PRN
Start: 2022-10-27 — End: ?

## 2022-10-27 MED ORDER — TRAMADOL HCL 50 MG PO TABS
50.0000 mg | ORAL_TABLET | Freq: Two times a day (BID) | ORAL | 2 refills | Status: DC
Start: 2022-10-27 — End: 2023-02-07

## 2022-10-27 MED ORDER — OXYCODONE HCL 5 MG PO TABS
5.0000 mg | ORAL_TABLET | Freq: Three times a day (TID) | ORAL | 0 refills | Status: DC | PRN
Start: 2022-10-27 — End: 2023-02-07

## 2022-10-27 MED ORDER — FOLIC ACID 1 MG PO TABS
ORAL_TABLET | ORAL | 1 refills | Status: DC
Start: 2022-10-27 — End: 2023-07-28

## 2022-10-27 MED ORDER — TRIAMCINOLONE ACETONIDE 0.1 % EX CREA
1.0000 | TOPICAL_CREAM | Freq: Two times a day (BID) | CUTANEOUS | 2 refills | Status: DC
Start: 2022-10-27 — End: 2023-07-28

## 2022-10-27 NOTE — Progress Notes (Signed)
[facility] [address] [place], [place] [address]  Internal MEDICINE  Office Visit Note  Patient Name: Russell Sullivan  [id number]  [id number]  Date of Service: 10/27/2022  Chief Complaint  Patient presents with   Diabetes   Hypertension   Hyperlipidemia   Follow-up    HPI Arhum presents for a follow-up visit for chronic pain, hypertension, and refills. Chronic pain -- takes tramadol for chronic arthritis pain. Has needed it more often, such as 3 times per day.  Hypertension -- elevated BP, rechecked and is improved to normal.  Medication list, refills.  Will get labs drawn prior to annual physical exam in november.    Current Medication: Outpatient Encounter Medications as of 10/27/2022  Medication Sig   Accu-Chek Softclix Lancets lancets Use to check blood sugars twice a week E11.65   aspirin 81 MG EC tablet Take by mouth.   doxycycline (VIBRA-TABS) 100 MG tablet Take 1 tablet (100 mg total) by mouth 2 (two) times daily for 21 days. Take with food   fluticasone (FLONASE) 50 MCG/ACT nasal spray Place 2 sprays into both nostrils daily.   glucose blood (ACCU-CHEK GUIDE) test strip check blood sugars twice a day E11.65   hydrocortisone ointment 0.5 % Apply 1 application topically 2 (two) times daily. Prn only for eczema   meloxicam (MOBIC) 15 MG tablet Take 1 tablet (15 mg total) by mouth daily.   metFORMIN (GLUCOPHAGE) 500 MG tablet Take 1 tablet (500 mg total) by mouth daily with breakfast.   mometasone (ELOCON) 0.1 % cream Apply 1 Application topically daily.   pantoprazole (PROTONIX) 40 MG tablet Take 2 tablets (80 mg total) by mouth daily.   rosuvastatin (CRESTOR) 20 MG tablet Take 1 tablet (20 mg total) by mouth at bedtime.   [DISCONTINUED] albuterol (VENTOLIN HFA) 108 (90 Base) MCG/ACT inhaler Inhale 2 puffs into the lungs every 6 (six) hours as needed for wheezing or shortness of breath.   [DISCONTINUED] amLODipine (NORVASC) 2.5 MG tablet Take 1 tablet  (2.5 mg total) by mouth daily.   [DISCONTINUED] folic acid (FOLVITE) 1 MG tablet folic acid 1 mg tablet  TAKE 1 TABLET BY MOUTH ONCE DAILY   [DISCONTINUED] methotrexate (RHEUMATREX) 2.5 MG tablet Take 6 tablets (15 mg total) by mouth once a week. Caution:Chemotherapy. Protect from light.   [DISCONTINUED] oxyCODONE (OXY IR/ROXICODONE) 5 MG immediate release tablet Take 1 tablet (5 mg total) by mouth 3 (three) times daily as needed for severe pain.   [DISCONTINUED] oxyCODONE (OXY IR/ROXICODONE) 5 MG immediate release tablet Take 1 tablet (5 mg total) by mouth 3 (three) times daily as needed for severe pain.   [DISCONTINUED] oxyCODONE (OXY IR/ROXICODONE) 5 MG immediate release tablet Take 1 tablet (5 mg total) by mouth 3 (three) times daily as needed for severe pain.   [DISCONTINUED] traMADol (ULTRAM) 50 MG tablet Take 1 tablet (50 mg total) by mouth 2 (two) times daily.   [DISCONTINUED] triamcinolone cream (KENALOG) 0.1 % Apply 1 Application topically 2 (two) times daily. To affected area until resolved.   albuterol (VENTOLIN HFA) 108 (90 Base) MCG/ACT inhaler Inhale 2 puffs into the lungs every 6 (six) hours as needed for wheezing or shortness of breath.   amLODipine (NORVASC) 2.5 MG tablet Take 1 tablet (2.5 mg total) by mouth daily.   folic acid (FOLVITE) 1 MG tablet folic acid 1 mg tablet  TAKE 1 TABLET BY MOUTH ONCE DAILY   methotrexate (RHEUMATREX) 2.5 MG tablet Take 6 tablets (15 mg total)  by mouth once a week. Caution:Chemotherapy. Protect from light.   oxyCODONE (OXY IR/ROXICODONE) 5 MG immediate release tablet Take 1 tablet (5 mg total) by mouth 3 (three) times daily as needed for severe pain.   oxyCODONE (OXY IR/ROXICODONE) 5 MG immediate release tablet Take 1 tablet (5 mg total) by mouth 3 (three) times daily as needed for severe pain.   oxyCODONE (OXY IR/ROXICODONE) 5 MG immediate release tablet Take 1 tablet (5 mg total) by mouth 3 (three) times daily as needed for severe pain.    traMADol (ULTRAM) 50 MG tablet Take 1 tablet (50 mg total) by mouth 2 (two) times daily.   triamcinolone cream (KENALOG) 0.1 % Apply 1 Application topically 2 (two) times daily. To affected area until resolved.   No facility-administered encounter medications on file as of 10/27/2022.    Surgical History: Past Surgical History:  Procedure Laterality Date   HERNIA REPAIR  2009   INTUBATION-ENDOTRACHEAL WITH TRACHEOSTOMY STANDBY N/A 01/13/2018   Procedure: INTUBATION-ENDOTRACHEAL WITH TRACHEOSTOMY STANDBY;  Surgeon: Linus Salmons, MD;  Location: [facility] ORS;  Service: ENT;  Laterality: N/A;   left shoulder surgery      Medical History: Past Medical History:  Diagnosis Date   Adenocarcinoma of prostate (HCC)    Diabetes mellitus without complication (HCC)    Hyperlipidemia    Hypertension     Family History: Family History  Problem Relation Age of Onset   Hypertension Mother    Diabetes Mother    Hypertension Father    Diabetes Maternal Grandmother    Diabetes Paternal Grandmother     Social History   Socioeconomic History   Marital status: Married    Spouse name: Not on file   Number of children: Not on file   Years of education: Not on file   Highest education level: Not on file  Occupational History   Not on file  Tobacco Use   Smoking status: Former    Types: Cigarettes   Smokeless tobacco: Never   Tobacco comments:    20 year ago  Substance and Sexual Activity   Alcohol use: Yes    Comment: ocassionally   Drug use: No   Sexual activity: Not on file  Other Topics Concern   Not on file  Social History Narrative   Not on file   Social Determinants of Health   Financial Resource Strain: Not on file  Food Insecurity: Not on file  Transportation Needs: Not on file  Physical Activity: Not on file  Stress: Not on file  Social Connections: Not on file  Intimate Partner Violence: Not on file      Review of Systems  Constitutional:  Negative for chills,  fatigue and unexpected weight change.  HENT:  Negative for congestion, rhinorrhea, sneezing and sore throat.   Eyes:  Negative for redness.  Respiratory:  Negative for cough, chest tightness and shortness of breath.   Cardiovascular:  Negative for chest pain and palpitations.  Gastrointestinal:  Negative for abdominal pain, constipation, diarrhea, nausea and vomiting.  Genitourinary:  Negative for dysuria and frequency.  Musculoskeletal:  Positive for arthralgias. Negative for back pain, joint swelling and neck pain.  Skin:  Negative for rash.  Neurological: Negative.  Negative for tremors and numbness.  Hematological:  Negative for adenopathy. Does not bruise/bleed easily.  Psychiatric/Behavioral:  Negative for behavioral problems (Depression), sleep disturbance and suicidal ideas. The patient is not nervous/anxious.     Vital Signs: BP 120/78 Comment: 170/82  Pulse 79  Temp 98.2 F (36.8 C)   Resp 16   Ht 5\' 10"  (1.778 m)   Wt 208 lb (94.3 kg)   SpO2 97%   BMI 29.84 kg/m    Physical Exam Vitals reviewed.  Constitutional:      General: He is not in acute distress.    Appearance: Normal appearance. He is not ill-appearing.  HENT:     Head: Normocephalic and atraumatic.  Eyes:     Pupils: Pupils are equal, round, and reactive to light.  Cardiovascular:     Rate and Rhythm: Normal rate and regular rhythm.  Pulmonary:     Effort: Pulmonary effort is normal. No respiratory distress.  Neurological:     Mental Status: He is alert and oriented to person, place, and time.  Psychiatric:        Mood and Affect: Mood normal.        Behavior: Behavior normal.        Assessment/Plan: 1. Type 2 diabetes mellitus without complication, without long-term current use of insulin (HCC) Routine labs ordered  - Hgb A1C w/o eAG - CMP14+EGFR - Lipid Profile  2. Seborrheic dermatitis of scalp Triamcinolone cream and doxycycline prescriptions refilled.  - triamcinolone cream  (KENALOG) 0.1 %; Apply 1 Application topically 2 (two) times daily. To affected area until resolved.  Dispense: 45 g; Refill: 2 - doxycycline (VIBRA-TABS) 100 MG tablet; Take 1 tablet (100 mg total) by mouth 2 (two) times daily for 21 days. Take with food  Dispense: 42 tablet; Refill: 0  3. Degenerative disc disease, lumbar Continue oxycodone as prescribed, follow up in 3 months for additional refills. May need to refill slightly earlier than his annual wellness visit in november - oxyCODONE (OXY IR/ROXICODONE) 5 MG immediate release tablet; Take 1 tablet (5 mg total) by mouth 3 (three) times daily as needed for severe pain.  Dispense: 90 tablet; Refill: 0 - oxyCODONE (OXY IR/ROXICODONE) 5 MG immediate release tablet; Take 1 tablet (5 mg total) by mouth 3 (three) times daily as needed for severe pain.  Dispense: 90 tablet; Refill: 0 - oxyCODONE (OXY IR/ROXICODONE) 5 MG immediate release tablet; Take 1 tablet (5 mg total) by mouth 3 (three) times daily as needed for severe pain.  Dispense: 90 tablet; Refill: 0 - traMADol (ULTRAM) 50 MG tablet; Take 1 tablet (50 mg total) by mouth 2 (two) times daily.  Dispense: 60 tablet; Refill: 2  4. Mixed hyperlipidemia Routine labs ordered  - CBC with Differential/Platelet - Hgb A1C w/o eAG - CMP14+EGFR - Lipid Profile - Vitamin D (25 hydroxy) - B12 and Folate Panel  5. B12 deficiency Routine labs ordered  - CBC with Differential/Platelet - B12 and Folate Panel  6. Vitamin D deficiency Routine lab ordered  - Vitamin D (25 hydroxy)  7. Encounter for medication review Medication list reviewed with patient, updated and refills ordered  - methotrexate (RHEUMATREX) 2.5 MG tablet; Take 6 tablets (15 mg total) by mouth once a week. Caution:Chemotherapy. Protect from light.  Dispense: 84 tablet; Refill: 1 - folic acid (FOLVITE) 1 MG tablet; folic acid 1 mg tablet  TAKE 1 TABLET BY MOUTH ONCE DAILY  Dispense: 90 tablet; Refill: 1 - amLODipine (NORVASC) 2.5  MG tablet; Take 1 tablet (2.5 mg total) by mouth daily.  Dispense: 90 tablet; Refill: 1 - albuterol (VENTOLIN HFA) 108 (90 Base) MCG/ACT inhaler; Inhale 2 puffs into the lungs every 6 (six) hours as needed for wheezing or shortness of breath.  Dispense: 18 g; Refill:  5   General Counseling: Jermale verbalizes understanding of the findings of todays visit and agrees with plan of treatment. I have discussed any further diagnostic evaluation that may be needed or ordered today. We also reviewed his medications today. he has been encouraged to call the office with any questions or concerns that should arise related to todays visit.    No orders of the defined types were placed in this encounter.   Meds ordered this encounter  Medications   triamcinolone cream (KENALOG) 0.1 %    Sig: Apply 1 Application topically 2 (two) times daily. To affected area until resolved.    Dispense:  45 g    Refill:  2   oxyCODONE (OXY IR/ROXICODONE) 5 MG immediate release tablet    Sig: Take 1 tablet (5 mg total) by mouth 3 (three) times daily as needed for severe pain.    Dispense:  90 tablet    Refill:  0    Fill for July   oxyCODONE (OXY IR/ROXICODONE) 5 MG immediate release tablet    Sig: Take 1 tablet (5 mg total) by mouth 3 (three) times daily as needed for severe pain.    Dispense:  90 tablet    Refill:  0    Fill for august   oxyCODONE (OXY IR/ROXICODONE) 5 MG immediate release tablet    Sig: Take 1 tablet (5 mg total) by mouth 3 (three) times daily as needed for severe pain.    Dispense:  90 tablet    Refill:  0    Fill for September   methotrexate (RHEUMATREX) 2.5 MG tablet    Sig: Take 6 tablets (15 mg total) by mouth once a week. Caution:Chemotherapy. Protect from light.    Dispense:  84 tablet    Refill:  1   traMADol (ULTRAM) 50 MG tablet    Sig: Take 1 tablet (50 mg total) by mouth 2 (two) times daily.    Dispense:  60 tablet    Refill:  2   folic acid (FOLVITE) 1 MG tablet    Sig: folic  acid 1 mg tablet  TAKE 1 TABLET BY MOUTH ONCE DAILY    Dispense:  90 tablet    Refill:  1   amLODipine (NORVASC) 2.5 MG tablet    Sig: Take 1 tablet (2.5 mg total) by mouth daily.    Dispense:  90 tablet    Refill:  1   albuterol (VENTOLIN HFA) 108 (90 Base) MCG/ACT inhaler    Sig: Inhale 2 puffs into the lungs every 6 (six) hours as needed for wheezing or shortness of breath.    Dispense:  18 g    Refill:  5   doxycycline (VIBRA-TABS) 100 MG tablet    Sig: Take 1 tablet (100 mg total) by mouth 2 (two) times daily for 21 days. Take with food    Dispense:  42 tablet    Refill:  0    Return in about 3 months (around 01/20/2023) for previously scheduled, CPE, Rachella Basden PCP on 02/07/23.   Total time spent:30 Minutes Time spent includes review of chart, medications, test results, and follow up plan with the patient.   [place] Controlled Substance Database was reviewed by me.  This patient was seen by Sallyanne Kuster, FNP-C in collaboration with Dr. Beverely Risen as a part of collaborative care agreement.   Edit Ricciardelli R. Tedd Sias, MSN, FNP-C Internal medicine

## 2022-12-03 DIAGNOSIS — C61 Malignant neoplasm of prostate: Secondary | ICD-10-CM | POA: Diagnosis not present

## 2022-12-06 DIAGNOSIS — Z1211 Encounter for screening for malignant neoplasm of colon: Secondary | ICD-10-CM | POA: Diagnosis not present

## 2022-12-06 DIAGNOSIS — Z1212 Encounter for screening for malignant neoplasm of rectum: Secondary | ICD-10-CM | POA: Diagnosis not present

## 2023-02-01 DIAGNOSIS — I10 Essential (primary) hypertension: Secondary | ICD-10-CM | POA: Diagnosis not present

## 2023-02-01 DIAGNOSIS — E782 Mixed hyperlipidemia: Secondary | ICD-10-CM | POA: Diagnosis not present

## 2023-02-01 DIAGNOSIS — L219 Seborrheic dermatitis, unspecified: Secondary | ICD-10-CM | POA: Diagnosis not present

## 2023-02-01 DIAGNOSIS — Z79899 Other long term (current) drug therapy: Secondary | ICD-10-CM | POA: Diagnosis not present

## 2023-02-01 DIAGNOSIS — E538 Deficiency of other specified B group vitamins: Secondary | ICD-10-CM | POA: Diagnosis not present

## 2023-02-01 DIAGNOSIS — M51369 Other intervertebral disc degeneration, lumbar region without mention of lumbar back pain or lower extremity pain: Secondary | ICD-10-CM | POA: Diagnosis not present

## 2023-02-01 DIAGNOSIS — E119 Type 2 diabetes mellitus without complications: Secondary | ICD-10-CM | POA: Diagnosis not present

## 2023-02-01 DIAGNOSIS — E559 Vitamin D deficiency, unspecified: Secondary | ICD-10-CM | POA: Diagnosis not present

## 2023-02-02 LAB — CBC WITH DIFFERENTIAL/PLATELET
Basophils Absolute: 0.1 10*3/uL (ref 0.0–0.2)
Basos: 1 %
EOS (ABSOLUTE): 0.1 10*3/uL (ref 0.0–0.4)
Eos: 2 %
Hematocrit: 42.5 % (ref 37.5–51.0)
Hemoglobin: 13.6 g/dL (ref 13.0–17.7)
Immature Grans (Abs): 0 10*3/uL (ref 0.0–0.1)
Immature Granulocytes: 0 %
Lymphocytes Absolute: 2.6 10*3/uL (ref 0.7–3.1)
Lymphs: 45 %
MCH: 30.8 pg (ref 26.6–33.0)
MCHC: 32 g/dL (ref 31.5–35.7)
MCV: 96 fL (ref 79–97)
Monocytes Absolute: 0.5 10*3/uL (ref 0.1–0.9)
Monocytes: 9 %
Neutrophils Absolute: 2.4 10*3/uL (ref 1.4–7.0)
Neutrophils: 43 %
Platelets: 232 10*3/uL (ref 150–450)
RBC: 4.42 x10E6/uL (ref 4.14–5.80)
RDW: 13.9 % (ref 11.6–15.4)
WBC: 5.7 10*3/uL (ref 3.4–10.8)

## 2023-02-02 LAB — CMP14+EGFR
ALT: 25 [IU]/L (ref 0–44)
AST: 29 [IU]/L (ref 0–40)
Albumin: 4.4 g/dL (ref 3.8–4.8)
Alkaline Phosphatase: 119 [IU]/L (ref 44–121)
BUN/Creatinine Ratio: 16 (ref 10–24)
BUN: 16 mg/dL (ref 8–27)
Bilirubin Total: 0.8 mg/dL (ref 0.0–1.2)
CO2: 24 mmol/L (ref 20–29)
Calcium: 9.2 mg/dL (ref 8.6–10.2)
Chloride: 103 mmol/L (ref 96–106)
Creatinine, Ser: 1 mg/dL (ref 0.76–1.27)
Globulin, Total: 2.3 g/dL (ref 1.5–4.5)
Glucose: 126 mg/dL — ABNORMAL HIGH (ref 70–99)
Potassium: 5.4 mmol/L — ABNORMAL HIGH (ref 3.5–5.2)
Sodium: 139 mmol/L (ref 134–144)
Total Protein: 6.7 g/dL (ref 6.0–8.5)
eGFR: 79 mL/min/{1.73_m2} (ref >59)

## 2023-02-02 LAB — LIPID PANEL
Chol/HDL Ratio: 2 {ratio} (ref 0.0–5.0)
Cholesterol, Total: 107 mg/dL (ref 100–199)
HDL: 53 mg/dL (ref >39)
LDL Chol Calc (NIH): 37 mg/dL (ref 0–99)
Triglycerides: 90 mg/dL (ref 0–149)
VLDL Cholesterol Cal: 17 mg/dL (ref 5–40)

## 2023-02-02 LAB — HGB A1C W/O EAG: Hgb A1c MFr Bld: 6.3 % — ABNORMAL HIGH (ref 4.8–5.6)

## 2023-02-02 LAB — B12 AND FOLATE PANEL
Folate: >20 ng/mL (ref >3.0)
Vitamin B-12: 429 pg/mL (ref 232–1245)

## 2023-02-02 LAB — VITAMIN D 25 HYDROXY (VIT D DEFICIENCY, FRACTURES): Vit D, 25-Hydroxy: 29.6 ng/mL — ABNORMAL LOW (ref 30.0–100.0)

## 2023-02-07 ENCOUNTER — Encounter: Payer: Self-pay | Admitting: Nurse Practitioner

## 2023-02-07 ENCOUNTER — Telehealth: Payer: Self-pay | Admitting: Nurse Practitioner

## 2023-02-07 ENCOUNTER — Ambulatory Visit (INDEPENDENT_AMBULATORY_CARE_PROVIDER_SITE_OTHER): Payer: Medicare HMO | Admitting: Nurse Practitioner

## 2023-02-07 VITALS — BP 135/80 | HR 80 | Temp 98.4°F | Resp 16 | Ht 70.0 in | Wt 208.0 lb

## 2023-02-07 DIAGNOSIS — Z0000 Encounter for general adult medical examination without abnormal findings: Secondary | ICD-10-CM | POA: Diagnosis not present

## 2023-02-07 DIAGNOSIS — M51369 Other intervertebral disc degeneration, lumbar region without mention of lumbar back pain or lower extremity pain: Secondary | ICD-10-CM

## 2023-02-07 DIAGNOSIS — G8929 Other chronic pain: Secondary | ICD-10-CM

## 2023-02-07 DIAGNOSIS — M5442 Lumbago with sciatica, left side: Secondary | ICD-10-CM

## 2023-02-07 DIAGNOSIS — M5441 Lumbago with sciatica, right side: Secondary | ICD-10-CM

## 2023-02-07 DIAGNOSIS — Z79899 Other long term (current) drug therapy: Secondary | ICD-10-CM

## 2023-02-07 DIAGNOSIS — M059 Rheumatoid arthritis with rheumatoid factor, unspecified: Secondary | ICD-10-CM | POA: Diagnosis not present

## 2023-02-07 DIAGNOSIS — L989 Disorder of the skin and subcutaneous tissue, unspecified: Secondary | ICD-10-CM

## 2023-02-07 MED ORDER — OXYCODONE HCL 5 MG PO TABS
5.0000 mg | ORAL_TABLET | Freq: Three times a day (TID) | ORAL | 0 refills | Status: DC | PRN
Start: 2023-02-07 — End: 2023-05-02

## 2023-02-07 MED ORDER — TRAMADOL HCL 50 MG PO TABS
50.0000 mg | ORAL_TABLET | Freq: Every evening | ORAL | 2 refills | Status: DC | PRN
Start: 2023-02-07 — End: 2023-05-02

## 2023-02-07 MED ORDER — AMLODIPINE BESYLATE 2.5 MG PO TABS
2.5000 mg | ORAL_TABLET | Freq: Every day | ORAL | 1 refills | Status: DC
Start: 2023-02-07 — End: 2023-07-28

## 2023-02-07 MED ORDER — METHOTREXATE SODIUM 2.5 MG PO TABS: 15.0000 mg | ORAL_TABLET | ORAL | 1 refills | Status: DC

## 2023-02-07 NOTE — Telephone Encounter (Signed)
Awaiting 02/07/23 office notes for Dermatology referral-Toni

## 2023-02-10 ENCOUNTER — Ambulatory Visit: Payer: Medicare HMO

## 2023-02-10 DIAGNOSIS — E119 Type 2 diabetes mellitus without complications: Secondary | ICD-10-CM

## 2023-02-11 LAB — MICROALBUMIN / CREATININE URINE RATIO
Creatinine, Urine: 38.6 mg/dL
Microalb/Creat Ratio: <8 mg/g{creat} (ref 0–29)
Microalbumin, Urine: <3 ug/mL

## 2023-02-14 ENCOUNTER — Telehealth: Payer: Self-pay | Admitting: Nurse Practitioner

## 2023-02-14 ENCOUNTER — Encounter: Payer: Self-pay | Admitting: Nurse Practitioner

## 2023-02-14 NOTE — Telephone Encounter (Signed)
Dermatology referral faxed to Dr. Corky Downs in [place]; [phone]. Notified patient. Gave pt telephone # [phone]

## 2023-02-22 ENCOUNTER — Telehealth: Payer: Self-pay | Admitting: Nurse Practitioner

## 2023-02-22 NOTE — Telephone Encounter (Signed)
Patient called stating Dr. Corky Downs did not get dermatology referral. Faxed to [phone]

## 2023-05-02 ENCOUNTER — Encounter: Payer: Self-pay | Admitting: Nurse Practitioner

## 2023-05-02 ENCOUNTER — Ambulatory Visit (INDEPENDENT_AMBULATORY_CARE_PROVIDER_SITE_OTHER): Payer: Medicare HMO | Admitting: Nurse Practitioner

## 2023-05-02 VITALS — BP 132/74 | HR 80 | Temp 98.3°F | Resp 16 | Ht 70.0 in | Wt 216.2 lb

## 2023-05-02 DIAGNOSIS — M5442 Lumbago with sciatica, left side: Secondary | ICD-10-CM | POA: Diagnosis not present

## 2023-05-02 DIAGNOSIS — E119 Type 2 diabetes mellitus without complications: Secondary | ICD-10-CM | POA: Diagnosis not present

## 2023-05-02 DIAGNOSIS — M5441 Lumbago with sciatica, right side: Secondary | ICD-10-CM | POA: Diagnosis not present

## 2023-05-02 DIAGNOSIS — L989 Disorder of the skin and subcutaneous tissue, unspecified: Secondary | ICD-10-CM

## 2023-05-02 DIAGNOSIS — E1169 Type 2 diabetes mellitus with other specified complication: Secondary | ICD-10-CM | POA: Diagnosis not present

## 2023-05-02 DIAGNOSIS — G8929 Other chronic pain: Secondary | ICD-10-CM

## 2023-05-02 LAB — POCT GLYCOSYLATED HEMOGLOBIN (HGB A1C): Hemoglobin A1C: 6.4 % — AB (ref 4.0–5.6)

## 2023-05-02 MED ORDER — TRAMADOL HCL 50 MG PO TABS: 50.0000 mg | ORAL_TABLET | Freq: Every evening | ORAL | 2 refills | Status: DC | PRN

## 2023-05-02 MED ORDER — OXYCODONE HCL 5 MG PO TABS: 5.0000 mg | ORAL_TABLET | Freq: Three times a day (TID) | ORAL | 0 refills | Status: DC | PRN

## 2023-05-02 NOTE — Progress Notes (Signed)
[facility] [address] [place], [place] [address]  Internal MEDICINE  Office Visit Note  Patient Name: Russell Sullivan  [id number]  [id number]  Date of Service: 05/02/2023  Chief Complaint  Patient presents with   Diabetes   Hyperlipidemia   Hypertension   Follow-up     Russell Sullivan presents for a follow-up visit for diabetes, skin lesions and chronic pain  Diabetes-- A1c is stable at 6.4 today.  Skin lesions -- spots on back of neck and scalp, tried to get in with [facility] in [place] but they did not have an appointment availabele until a year from now.  Chronic back pain pain -- takes oxycodone 3 times daily as need for chronic pain and takes tramadol only at bedtime.     Current Medication: Outpatient Encounter Medications as of 05/02/2023  Medication Sig   Accu-Chek Softclix Lancets lancets Use to check blood sugars twice a week E11.65   albuterol (VENTOLIN HFA) 108 (90 Base) MCG/ACT inhaler Inhale 2 puffs into the lungs every 6 (six) hours as needed for wheezing or shortness of breath.   amLODipine (NORVASC) 2.5 MG tablet Take 1 tablet (2.5 mg total) by mouth daily.   aspirin 81 MG EC tablet Take by mouth.   fluticasone (FLONASE) 50 MCG/ACT nasal spray Place 2 sprays into both nostrils daily.   folic acid (FOLVITE) 1 MG tablet folic acid 1 mg tablet  TAKE 1 TABLET BY MOUTH ONCE DAILY   glucose blood (ACCU-CHEK GUIDE) test strip check blood sugars twice a day E11.65   hydrocortisone ointment 0.5 % Apply 1 application topically 2 (two) times daily. Prn only for eczema   meloxicam (MOBIC) 15 MG tablet Take 1 tablet (15 mg total) by mouth daily.   metFORMIN (GLUCOPHAGE) 500 MG tablet Take 1 tablet (500 mg total) by mouth daily with breakfast.   methotrexate (RHEUMATREX) 2.5 MG tablet Take 6 tablets (15 mg total) by mouth once a week. Caution:Chemotherapy. Protect from light.   mometasone (ELOCON) 0.1 % cream Apply 1 Application topically daily.   pantoprazole  (PROTONIX) 40 MG tablet Take 2 tablets (80 mg total) by mouth daily.   rosuvastatin (CRESTOR) 20 MG tablet Take 1 tablet (20 mg total) by mouth at bedtime.   triamcinolone cream (KENALOG) 0.1 % Apply 1 Application topically 2 (two) times daily. To affected area until resolved.   [DISCONTINUED] oxyCODONE (OXY IR/ROXICODONE) 5 MG immediate release tablet Take 1 tablet (5 mg total) by mouth 3 (three) times daily as needed for severe pain (pain score 7-10).   [DISCONTINUED] oxyCODONE (OXY IR/ROXICODONE) 5 MG immediate release tablet Take 1 tablet (5 mg total) by mouth 3 (three) times daily as needed for severe pain (pain score 7-10).   [DISCONTINUED] oxyCODONE (OXY IR/ROXICODONE) 5 MG immediate release tablet Take 1 tablet (5 mg total) by mouth 3 (three) times daily as needed for severe pain (pain score 7-10).   [DISCONTINUED] traMADol (ULTRAM) 50 MG tablet Take 1 tablet (50 mg total) by mouth at bedtime as needed for moderate pain (pain score 4-6) or severe pain (pain score 7-10).   [START ON 06/27/2023] oxyCODONE (OXY IR/ROXICODONE) 5 MG immediate release tablet Take 1 tablet (5 mg total) by mouth 3 (three) times daily as needed for severe pain (pain score 7-10).   [START ON 05/30/2023] oxyCODONE (OXY IR/ROXICODONE) 5 MG immediate release tablet Take 1 tablet (5 mg total) by mouth 3 (three) times daily as needed for severe pain (pain score 7-10).   oxyCODONE (  OXY IR/ROXICODONE) 5 MG immediate release tablet Take 1 tablet (5 mg total) by mouth 3 (three) times daily as needed for severe pain (pain score 7-10).   traMADol (ULTRAM) 50 MG tablet Take 1 tablet (50 mg total) by mouth at bedtime as needed for moderate pain (pain score 4-6) or severe pain (pain score 7-10).   No facility-administered encounter medications on file as of 05/02/2023.    Surgical History: Past Surgical History:  Procedure Laterality Date   HERNIA REPAIR  2009   INTUBATION-ENDOTRACHEAL WITH TRACHEOSTOMY STANDBY N/A 01/13/2018    Procedure: INTUBATION-ENDOTRACHEAL WITH TRACHEOSTOMY STANDBY;  Surgeon: Linus Salmons, MD;  Location: [facility] ORS;  Service: ENT;  Laterality: N/A;   left shoulder surgery      Medical History: Past Medical History:  Diagnosis Date   Adenocarcinoma of prostate (HCC)    Diabetes mellitus without complication (HCC)    Hyperlipidemia    Hypertension     Family History: Family History  Problem Relation Age of Onset   Hypertension Mother    Diabetes Mother    Hypertension Father    Diabetes Maternal Grandmother    Diabetes Paternal Grandmother     Social History   Socioeconomic History   Marital status: Married    Spouse name: Not on file   Number of children: Not on file   Years of education: Not on file   Highest education level: Not on file  Occupational History   Not on file  Tobacco Use   Smoking status: Former    Types: Cigarettes   Smokeless tobacco: Never   Tobacco comments:    20 year ago  Substance and Sexual Activity   Alcohol use: Yes    Comment: ocassionally   Drug use: No   Sexual activity: Not on file  Other Topics Concern   Not on file  Social History Narrative   Not on file   Social Drivers of Health   Financial Resource Strain: Low Risk  (02/07/2023)   Overall Financial Resource Strain (CARDIA)    Difficulty of Paying Living Expenses: Not hard at all  Food Insecurity: No Food Insecurity (02/07/2023)   Hunger Vital Sign    Worried About Running Out of Food in the Last Year: Never true    Ran Out of Food in the Last Year: Never true  Transportation Needs: No Transportation Needs (02/07/2023)   PRAPARE - Administrator, Civil Service (Medical): No    Lack of Transportation (Non-Medical): No  Physical Activity: Insufficiently Active (02/07/2023)   Exercise Vital Sign    Days of Exercise per Week: 4 days    Minutes of Exercise per Session: 30 min  Stress: No Stress Concern Present (02/07/2023)   [employer] of Occupational  Health - Occupational Stress Questionnaire    Feeling of Stress : Only a little  Social Connections: Moderately Isolated (02/14/2023)   Social Connection and Isolation Panel [NHANES]    Frequency of Communication with Friends and Family: Once a week    Frequency of Social Gatherings with Friends and Family: Once a week    Attends Religious Services: 1 to 4 times per year    Active Member of [employer] or Organizations: No    Attends Banker Meetings: Never    Marital Status: Married  Catering manager Violence: Not At Risk (02/07/2023)   Humiliation, Afraid, Rape, and Kick questionnaire    Fear of Current or Ex-Partner: No    Emotionally Abused: No  Physically Abused: No    Sexually Abused: No      Review of Systems  Constitutional:  Negative for chills, fatigue and unexpected weight change.  HENT:  Negative for congestion, rhinorrhea, sneezing and sore throat.   Eyes:  Negative for redness.  Respiratory:  Negative for cough, chest tightness and shortness of breath.   Cardiovascular:  Negative for chest pain and palpitations.  Gastrointestinal:  Negative for abdominal pain, constipation, diarrhea, nausea and vomiting.  Genitourinary:  Negative for dysuria and frequency.  Musculoskeletal:  Positive for arthralgias. Negative for back pain, joint swelling and neck pain.  Skin:  Negative for rash.  Neurological: Negative.  Negative for tremors and numbness.  Hematological:  Negative for adenopathy. Does not bruise/bleed easily.  Psychiatric/Behavioral:  Negative for behavioral problems (Depression), sleep disturbance and suicidal ideas. The patient is not nervous/anxious.     Vital Signs: BP 132/74 Comment: 160/76  Pulse 80   Temp 98.3 F (36.8 C)   Resp 16   Ht 5\' 10"  (1.778 m)   Wt 216 lb 3.2 oz (98.1 kg)   SpO2 96%   BMI 31.02 kg/m    Physical Exam Vitals reviewed.  Constitutional:      General: He is not in acute distress.    Appearance: Normal appearance.  He is not ill-appearing.  HENT:     Head: Normocephalic and atraumatic.  Eyes:     Pupils: Pupils are equal, round, and reactive to light.  Cardiovascular:     Rate and Rhythm: Normal rate and regular rhythm.  Pulmonary:     Effort: Pulmonary effort is normal. No respiratory distress.  Neurological:     Mental Status: He is alert and oriented to person, place, and time.  Psychiatric:        Mood and Affect: Mood normal.        Behavior: Behavior normal.        Assessment/Plan: 1. Type 2 diabetes mellitus with other specified complication, without long-term current use of insulin (HCC) (Primary) No significant change in A1c, remains stable. Urine sent for microalbumin/creatinine ratio. Continue metformin as prescribed. - POCT glycosylated hemoglobin (Hb A1C) - Urine Microalbumin w/creat. ratio  2. Skin lesions New referral to dermatology, wants to stay in town instead of doing to [facility] in [place].  - Ambulatory referral to Dermatology  3. Chronic bilateral low back pain with bilateral sciatica Continue prn oxycodone and tramadol as prescribed. Follow up in 3 months for additional refills. UDS due at next office visit.  - oxyCODONE (OXY IR/ROXICODONE) 5 MG immediate release tablet; Take 1 tablet (5 mg total) by mouth 3 (three) times daily as needed for severe pain (pain score 7-10).  Dispense: 90 tablet; Refill: 0 - oxyCODONE (OXY IR/ROXICODONE) 5 MG immediate release tablet; Take 1 tablet (5 mg total) by mouth 3 (three) times daily as needed for severe pain (pain score 7-10).  Dispense: 90 tablet; Refill: 0 - oxyCODONE (OXY IR/ROXICODONE) 5 MG immediate release tablet; Take 1 tablet (5 mg total) by mouth 3 (three) times daily as needed for severe pain (pain score 7-10).  Dispense: 90 tablet; Refill: 0 - traMADol (ULTRAM) 50 MG tablet; Take 1 tablet (50 mg total) by mouth at bedtime as needed for moderate pain (pain score 4-6) or severe pain (pain score 7-10).  Dispense: 30 tablet;  Refill: 2   General Counseling: Binnie verbalizes understanding of the findings of todays visit and agrees with plan of treatment. I have discussed any further diagnostic evaluation  that may be needed or ordered today. We also reviewed his medications today. he has been encouraged to call the office with any questions or concerns that should arise related to todays visit.    Orders Placed This Encounter  Procedures   Urine Microalbumin w/creat. ratio   Ambulatory referral to Dermatology   POCT glycosylated hemoglobin (Hb A1C)    Meds ordered this encounter  Medications   oxyCODONE (OXY IR/ROXICODONE) 5 MG immediate release tablet    Sig: Take 1 tablet (5 mg total) by mouth 3 (three) times daily as needed for severe pain (pain score 7-10).    Dispense:  90 tablet    Refill:  0    Fill for march   oxyCODONE (OXY IR/ROXICODONE) 5 MG immediate release tablet    Sig: Take 1 tablet (5 mg total) by mouth 3 (three) times daily as needed for severe pain (pain score 7-10).    Dispense:  90 tablet    Refill:  0    Fill for February   oxyCODONE (OXY IR/ROXICODONE) 5 MG immediate release tablet    Sig: Take 1 tablet (5 mg total) by mouth 3 (three) times daily as needed for severe pain (pain score 7-10).    Dispense:  90 tablet    Refill:  0    Fill for august   traMADol (ULTRAM) 50 MG tablet    Sig: Take 1 tablet (50 mg total) by mouth at bedtime as needed for moderate pain (pain score 4-6) or severe pain (pain score 7-10).    Dispense:  30 tablet    Refill:  2    please keep for future refills on file    Return in about 3 months (around 07/27/2023) for F/U, pain med refill, Wrigley Plasencia PCP.   Total time spent:30 Minutes Time spent includes review of chart, medications, test results, and follow up plan with the patient.   [place] Controlled Substance Database was reviewed by me.  This patient was seen by Sallyanne Kuster, FNP-C in collaboration with Dr. Beverely Risen as a part of collaborative care  agreement.   Junious Ragone R. Tedd Sias, MSN, FNP-C Internal medicine

## 2023-05-03 ENCOUNTER — Telehealth: Payer: Self-pay | Admitting: Nurse Practitioner

## 2023-05-03 LAB — MICROALBUMIN / CREATININE URINE RATIO
Creatinine, Urine: 188.3 mg/dL
Microalb/Creat Ratio: 5 mg/g{creat} (ref 0–29)
Microalbumin, Urine: 8.7 ug/mL

## 2023-05-03 NOTE — Telephone Encounter (Signed)
Urgent Dermatology referral sent via [facility] to Dr. Adolphus Birchwood w/ [facility].  Notified patient. Gave telephone # [phone]

## 2023-05-05 DIAGNOSIS — L570 Actinic keratosis: Secondary | ICD-10-CM | POA: Diagnosis not present

## 2023-05-05 DIAGNOSIS — D2262 Melanocytic nevi of left upper limb, including shoulder: Secondary | ICD-10-CM | POA: Diagnosis not present

## 2023-05-05 DIAGNOSIS — D225 Melanocytic nevi of trunk: Secondary | ICD-10-CM | POA: Diagnosis not present

## 2023-05-05 DIAGNOSIS — L821 Other seborrheic keratosis: Secondary | ICD-10-CM | POA: Diagnosis not present

## 2023-05-05 DIAGNOSIS — D2261 Melanocytic nevi of right upper limb, including shoulder: Secondary | ICD-10-CM | POA: Diagnosis not present

## 2023-05-05 DIAGNOSIS — L281 Prurigo nodularis: Secondary | ICD-10-CM | POA: Diagnosis not present

## 2023-05-06 ENCOUNTER — Telehealth: Payer: Self-pay | Admitting: Nurse Practitioner

## 2023-05-06 NOTE — Telephone Encounter (Signed)
Dermatology appointment 05/05/2023 @ [facility]

## 2023-05-27 ENCOUNTER — Other Ambulatory Visit: Payer: Self-pay

## 2023-05-27 DIAGNOSIS — E119 Type 2 diabetes mellitus without complications: Secondary | ICD-10-CM

## 2023-05-27 MED ORDER — METFORMIN HCL 500 MG PO TABS: 500.0000 mg | ORAL_TABLET | Freq: Every day | ORAL | 1 refills | Status: DC

## 2023-07-28 ENCOUNTER — Encounter: Payer: Self-pay | Admitting: Nurse Practitioner

## 2023-07-28 ENCOUNTER — Ambulatory Visit: Payer: Medicare HMO | Admitting: Nurse Practitioner

## 2023-07-28 VITALS — BP 135/75 | HR 69 | Temp 98.8°F | Resp 16 | Ht 70.0 in | Wt 212.4 lb

## 2023-07-28 DIAGNOSIS — E785 Hyperlipidemia, unspecified: Secondary | ICD-10-CM

## 2023-07-28 DIAGNOSIS — G8929 Other chronic pain: Secondary | ICD-10-CM

## 2023-07-28 DIAGNOSIS — M0579 Rheumatoid arthritis with rheumatoid factor of multiple sites without organ or systems involvement: Secondary | ICD-10-CM

## 2023-07-28 DIAGNOSIS — K219 Gastro-esophageal reflux disease without esophagitis: Secondary | ICD-10-CM | POA: Diagnosis not present

## 2023-07-28 DIAGNOSIS — Z79899 Other long term (current) drug therapy: Secondary | ICD-10-CM

## 2023-07-28 DIAGNOSIS — E1169 Type 2 diabetes mellitus with other specified complication: Secondary | ICD-10-CM

## 2023-07-28 DIAGNOSIS — E1159 Type 2 diabetes mellitus with other circulatory complications: Secondary | ICD-10-CM

## 2023-07-28 DIAGNOSIS — E782 Mixed hyperlipidemia: Secondary | ICD-10-CM

## 2023-07-28 DIAGNOSIS — I152 Hypertension secondary to endocrine disorders: Secondary | ICD-10-CM

## 2023-07-28 DIAGNOSIS — M5442 Lumbago with sciatica, left side: Secondary | ICD-10-CM

## 2023-07-28 DIAGNOSIS — M5441 Lumbago with sciatica, right side: Secondary | ICD-10-CM

## 2023-07-28 MED ORDER — PANTOPRAZOLE SODIUM 40 MG PO TBEC: 80.0000 mg | DELAYED_RELEASE_TABLET | Freq: Every day | ORAL | 3 refills | Status: DC

## 2023-07-28 MED ORDER — TRAMADOL HCL 50 MG PO TABS: 50.0000 mg | ORAL_TABLET | Freq: Every evening | ORAL | 2 refills | Status: DC | PRN

## 2023-07-28 MED ORDER — OXYCODONE HCL 5 MG PO TABS: 5.0000 mg | ORAL_TABLET | Freq: Three times a day (TID) | ORAL | 0 refills | Status: DC | PRN

## 2023-07-28 MED ORDER — AMLODIPINE BESYLATE 2.5 MG PO TABS: 2.5000 mg | ORAL_TABLET | Freq: Every day | ORAL | 1 refills | Status: DC

## 2023-07-28 MED ORDER — MELOXICAM 15 MG PO TABS: 15.0000 mg | ORAL_TABLET | Freq: Every day | ORAL | 3 refills | Status: DC

## 2023-07-28 MED ORDER — ROSUVASTATIN CALCIUM 20 MG PO TABS: 20.0000 mg | ORAL_TABLET | Freq: Every day | ORAL | 3 refills | Status: DC

## 2023-07-28 MED ORDER — FOLIC ACID 1 MG PO TABS: ORAL_TABLET | ORAL | 1 refills | Status: DC

## 2023-07-28 MED ORDER — METHOTREXATE SODIUM 2.5 MG PO TABS: 15.0000 mg | ORAL_TABLET | ORAL | 1 refills | Status: DC

## 2023-07-28 NOTE — Progress Notes (Signed)
 [facility] [address] [place], [place] [address]  Internal MEDICINE  Office Visit Note  Patient Name: Russell Sullivan  [id number]  [id number]  Date of Service: 07/28/2023  Chief Complaint  Patient presents with   Diabetes   Hypertension   Hyperlipidemia   Follow-up    HPI Kota presents for a follow-up visit for diabetes, hypertension, high cholesterol, RA, GERD, chronic back pain and refills.  Diabetes -- taking metformin  Hypertension -- controlled with amlodipine  High cholesterol -- takes rosuvastatin  RA and chronic joint pains -- chronically on methotrexate  and folic acid . Also taking meloxicam  daily.  Chronic low back pain -- takes oxycodone  as needed. Takes tramadol  at bedtime only and does not take tramadol  and oxycodone  at the same time.  GERD -- symptoms controlled with pantoprazole     Current Medication: Outpatient Encounter Medications as of 07/28/2023  Medication Sig   Accu-Chek Softclix Lancets lancets Use to check blood sugars twice a week E11.65   albuterol  (VENTOLIN  HFA) 108 (90 Base) MCG/ACT inhaler Inhale 2 puffs into the lungs every 6 (six) hours as needed for wheezing or shortness of breath.   aspirin 81 MG EC tablet Take by mouth.   clindamycin (CLEOCIN T) 1 % external solution Apply topically daily.   fluticasone  (FLONASE ) 50 MCG/ACT nasal spray Place 2 sprays into both nostrils daily.   glucose blood (ACCU-CHEK GUIDE) test strip check blood sugars twice a day E11.65   hydrocortisone  ointment 0.5 % Apply 1 application topically 2 (two) times daily. Prn only for eczema   metFORMIN  (GLUCOPHAGE ) 500 MG tablet Take 1 tablet (500 mg total) by mouth daily with breakfast.   [DISCONTINUED] amLODipine  (NORVASC ) 2.5 MG tablet Take 1 tablet (2.5 mg total) by mouth daily.   [DISCONTINUED] folic acid  (FOLVITE ) 1 MG tablet folic acid  1 mg tablet  TAKE 1 TABLET BY MOUTH ONCE DAILY   [DISCONTINUED] meloxicam  (MOBIC ) 15 MG tablet Take 1 tablet (15 mg  total) by mouth daily.   [DISCONTINUED] methotrexate  (RHEUMATREX) 2.5 MG tablet Take 6 tablets (15 mg total) by mouth once a week. Caution:Chemotherapy. Protect from light.   [DISCONTINUED] mometasone  (ELOCON ) 0.1 % cream Apply 1 Application topically daily.   [DISCONTINUED] oxyCODONE  (OXY IR/ROXICODONE ) 5 MG immediate release tablet Take 1 tablet (5 mg total) by mouth 3 (three) times daily as needed for severe pain (pain score 7-10).   [DISCONTINUED] oxyCODONE  (OXY IR/ROXICODONE ) 5 MG immediate release tablet Take 1 tablet (5 mg total) by mouth 3 (three) times daily as needed for severe pain (pain score 7-10).   [DISCONTINUED] oxyCODONE  (OXY IR/ROXICODONE ) 5 MG immediate release tablet Take 1 tablet (5 mg total) by mouth 3 (three) times daily as needed for severe pain (pain score 7-10).   [DISCONTINUED] pantoprazole  (PROTONIX ) 40 MG tablet Take 2 tablets (80 mg total) by mouth daily.   [DISCONTINUED] rosuvastatin  (CRESTOR ) 20 MG tablet Take 1 tablet (20 mg total) by mouth at bedtime.   [DISCONTINUED] traMADol  (ULTRAM ) 50 MG tablet Take 1 tablet (50 mg total) by mouth at bedtime as needed for moderate pain (pain score 4-6) or severe pain (pain score 7-10).   [DISCONTINUED] triamcinolone  cream (KENALOG ) 0.1 % Apply 1 Application topically 2 (two) times daily. To affected area until resolved.   amLODipine  (NORVASC ) 2.5 MG tablet Take 1 tablet (2.5 mg total) by mouth daily.   folic acid  (FOLVITE ) 1 MG tablet folic acid  1 mg tablet  TAKE 1 TABLET BY MOUTH ONCE DAILY   meloxicam  (MOBIC ) 15 MG tablet  Take 1 tablet (15 mg total) by mouth daily.   methotrexate  (RHEUMATREX) 2.5 MG tablet Take 6 tablets (15 mg total) by mouth once a week. Caution:Chemotherapy. Protect from light.   oxyCODONE  (OXY IR/ROXICODONE ) 5 MG immediate release tablet Take 1 tablet (5 mg total) by mouth 3 (three) times daily as needed for severe pain (pain score 7-10).   oxyCODONE  (OXY IR/ROXICODONE ) 5 MG immediate release tablet Take 1  tablet (5 mg total) by mouth 3 (three) times daily as needed for severe pain (pain score 7-10).   oxyCODONE  (OXY IR/ROXICODONE ) 5 MG immediate release tablet Take 1 tablet (5 mg total) by mouth 3 (three) times daily as needed for severe pain (pain score 7-10).   pantoprazole  (PROTONIX ) 40 MG tablet Take 2 tablets (80 mg total) by mouth daily.   rosuvastatin  (CRESTOR ) 20 MG tablet Take 1 tablet (20 mg total) by mouth at bedtime.   traMADol  (ULTRAM ) 50 MG tablet Take 1 tablet (50 mg total) by mouth at bedtime as needed for moderate pain (pain score 4-6) or severe pain (pain score 7-10).   No facility-administered encounter medications on file as of 07/28/2023.    Surgical History: Past Surgical History:  Procedure Laterality Date   HERNIA REPAIR  2009   INTUBATION-ENDOTRACHEAL WITH TRACHEOSTOMY STANDBY N/A 01/13/2018   Procedure: INTUBATION-ENDOTRACHEAL WITH TRACHEOSTOMY STANDBY;  Surgeon: Lesly Raspberry, MD;  Location: [facility] ORS;  Service: ENT;  Laterality: N/A;   left shoulder surgery      Medical History: Past Medical History:  Diagnosis Date   Adenocarcinoma of prostate (HCC)    Diabetes mellitus without complication (HCC)    Hyperlipidemia    Hypertension     Family History: Family History  Problem Relation Age of Onset   Hypertension Mother    Diabetes Mother    Hypertension Father    Diabetes Maternal Grandmother    Diabetes Paternal Grandmother     Social History   Socioeconomic History   Marital status: Married    Spouse name: Not on file   Number of children: Not on file   Years of education: Not on file   Highest education level: Not on file  Occupational History   Not on file  Tobacco Use   Smoking status: Former    Types: Cigarettes   Smokeless tobacco: Never   Tobacco comments:    20 year ago  Substance and Sexual Activity   Alcohol use: Yes    Comment: ocassionally   Drug use: No   Sexual activity: Not on file  Other Topics Concern   Not on file   Social History Narrative   Not on file   Social Drivers of Health   Financial Resource Strain: Low Risk  (02/07/2023)   Overall Financial Resource Strain (CARDIA)    Difficulty of Paying Living Expenses: Not hard at all  Food Insecurity: No Food Insecurity (02/07/2023)   Hunger Vital Sign    Worried About Running Out of Food in the Last Year: Never true    Ran Out of Food in the Last Year: Never true  Transportation Needs: No Transportation Needs (02/07/2023)   PRAPARE - Administrator, Civil Service (Medical): No    Lack of Transportation (Non-Medical): No  Physical Activity: Insufficiently Active (02/07/2023)   Exercise Vital Sign    Days of Exercise per Week: 4 days    Minutes of Exercise per Session: 30 min  Stress: No Stress Concern Present (02/07/2023)   [employer] of Occupational  Health - Occupational Stress Questionnaire    Feeling of Stress : Only a little  Social Connections: Moderately Isolated (02/14/2023)   Social Connection and Isolation Panel [NHANES]    Frequency of Communication with Friends and Family: Once a week    Frequency of Social Gatherings with Friends and Family: Once a week    Attends Religious Services: 1 to 4 times per year    Active Member of [employer] or Organizations: No    Attends Banker Meetings: Never    Marital Status: Married  Catering manager Violence: Not At Risk (02/07/2023)   Humiliation, Afraid, Rape, and Kick questionnaire    Fear of Current or Ex-Partner: No    Emotionally Abused: No    Physically Abused: No    Sexually Abused: No      Review of Systems  Constitutional:  Negative for chills, fatigue and unexpected weight change.  HENT:  Negative for congestion, rhinorrhea, sneezing and sore throat.   Eyes:  Negative for redness.  Respiratory:  Negative for cough, chest tightness and shortness of breath.   Cardiovascular:  Negative for chest pain and palpitations.  Gastrointestinal:  Negative for  abdominal pain, constipation, diarrhea, nausea and vomiting.  Genitourinary:  Negative for dysuria and frequency.  Musculoskeletal:  Positive for arthralgias. Negative for back pain, joint swelling and neck pain.  Skin:  Negative for rash.  Neurological: Negative.  Negative for tremors and numbness.  Hematological:  Negative for adenopathy. Does not bruise/bleed easily.  Psychiatric/Behavioral:  Negative for behavioral problems (Depression), sleep disturbance and suicidal ideas. The patient is not nervous/anxious.     Vital Signs: BP 135/75 Comment: 150/80  Pulse 69   Temp 98.8 F (37.1 C)   Resp 16   Ht 5\' 10"  (1.778 m)   Wt 212 lb 6.4 oz (96.3 kg)   SpO2 99%   BMI 30.48 kg/m    Physical Exam Vitals reviewed.  Constitutional:      General: He is not in acute distress.    Appearance: Normal appearance. He is not ill-appearing.  HENT:     Head: Normocephalic and atraumatic.  Eyes:     Pupils: Pupils are equal, round, and reactive to light.  Cardiovascular:     Rate and Rhythm: Normal rate and regular rhythm.  Pulmonary:     Effort: Pulmonary effort is normal. No respiratory distress.  Neurological:     Mental Status: He is alert and oriented to person, place, and time.  Psychiatric:        Mood and Affect: Mood normal.        Behavior: Behavior normal.        Assessment/Plan: 1. Type 2 diabetes mellitus with other specified complication, without long-term current use of insulin (HCC) (Primary) Continue metformin  as prescribed   2. Hypertension associated with type 2 diabetes mellitus (HCC) Stable, continue amlodipine  as prescribed  - amLODipine  (NORVASC ) 2.5 MG tablet; Take 1 tablet (2.5 mg total) by mouth daily.  Dispense: 90 tablet; Refill: 1  3. Hyperlipidemia associated with type 2 diabetes mellitus (HCC) Continue rosuvastatin  as prescribed.  - rosuvastatin  (CRESTOR ) 20 MG tablet; Take 1 tablet (20 mg total) by mouth at bedtime.  Dispense: 90 tablet; Refill:  3  4. Chronic bilateral low back pain with bilateral sciatica Continue prn tramadol  and oxycodone  as prescribed. Follow up in 3 months for additional refills. Patient is aware not to take tramadol  and oxycodone  together.  - traMADol  (ULTRAM ) 50 MG tablet; Take 1 tablet (50  mg total) by mouth at bedtime as needed for moderate pain (pain score 4-6) or severe pain (pain score 7-10).  Dispense: 30 tablet; Refill: 2 - oxyCODONE  (OXY IR/ROXICODONE ) 5 MG immediate release tablet; Take 1 tablet (5 mg total) by mouth 3 (three) times daily as needed for severe pain (pain score 7-10).  Dispense: 90 tablet; Refill: 0 - oxyCODONE  (OXY IR/ROXICODONE ) 5 MG immediate release tablet; Take 1 tablet (5 mg total) by mouth 3 (three) times daily as needed for severe pain (pain score 7-10).  Dispense: 90 tablet; Refill: 0 - oxyCODONE  (OXY IR/ROXICODONE ) 5 MG immediate release tablet; Take 1 tablet (5 mg total) by mouth 3 (three) times daily as needed for severe pain (pain score 7-10).  Dispense: 90 tablet; Refill: 0  5. Rheumatoid arthritis involving multiple sites with positive rheumatoid factor (HCC) Continue meloxicam , methotrexate  and folic acid  as prescribed.  - meloxicam  (MOBIC ) 15 MG tablet; Take 1 tablet (15 mg total) by mouth daily.  Dispense: 90 tablet; Refill: 3 - methotrexate  (RHEUMATREX) 2.5 MG tablet; Take 6 tablets (15 mg total) by mouth once a week. Caution:Chemotherapy. Protect from light.  Dispense: 84 tablet; Refill: 1 - folic acid  (FOLVITE ) 1 MG tablet; folic acid  1 mg tablet  TAKE 1 TABLET BY MOUTH ONCE DAILY  Dispense: 90 tablet; Refill: 1 - oxyCODONE  (OXY IR/ROXICODONE ) 5 MG immediate release tablet; Take 1 tablet (5 mg total) by mouth 3 (three) times daily as needed for severe pain (pain score 7-10).  Dispense: 90 tablet; Refill: 0 - oxyCODONE  (OXY IR/ROXICODONE ) 5 MG immediate release tablet; Take 1 tablet (5 mg total) by mouth 3 (three) times daily as needed for severe pain (pain score 7-10).   Dispense: 90 tablet; Refill: 0 - oxyCODONE  (OXY IR/ROXICODONE ) 5 MG immediate release tablet; Take 1 tablet (5 mg total) by mouth 3 (three) times daily as needed for severe pain (pain score 7-10).  Dispense: 90 tablet; Refill: 0  6. Gastro-esophageal reflux disease without esophagitis Stable, continue pantoprazole  as prescribed.  - pantoprazole  (PROTONIX ) 40 MG tablet; Take 2 tablets (80 mg total) by mouth daily.  Dispense: 180 tablet; Refill: 3   General Counseling: Georgie verbalizes understanding of the findings of todays visit and agrees with plan of treatment. I have discussed any further diagnostic evaluation that may be needed or ordered today. We also reviewed his medications today. he has been encouraged to call the office with any questions or concerns that should arise related to todays visit.    No orders of the defined types were placed in this encounter.   Meds ordered this encounter  Medications   amLODipine  (NORVASC ) 2.5 MG tablet    Sig: Take 1 tablet (2.5 mg total) by mouth daily.    Dispense:  90 tablet    Refill:  1   meloxicam  (MOBIC ) 15 MG tablet    Sig: Take 1 tablet (15 mg total) by mouth daily.    Dispense:  90 tablet    Refill:  3    Please fill as 90 day prescription   methotrexate  (RHEUMATREX) 2.5 MG tablet    Sig: Take 6 tablets (15 mg total) by mouth once a week. Caution:Chemotherapy. Protect from light.    Dispense:  84 tablet    Refill:  1   folic acid  (FOLVITE ) 1 MG tablet    Sig: folic acid  1 mg tablet  TAKE 1 TABLET BY MOUTH ONCE DAILY    Dispense:  90 tablet    Refill:  1  traMADol  (ULTRAM ) 50 MG tablet    Sig: Take 1 tablet (50 mg total) by mouth at bedtime as needed for moderate pain (pain score 4-6) or severe pain (pain score 7-10).    Dispense:  30 tablet    Refill:  2    please keep for future refills on file   rosuvastatin  (CRESTOR ) 20 MG tablet    Sig: Take 1 tablet (20 mg total) by mouth at bedtime.    Dispense:  90 tablet     Refill:  3   pantoprazole  (PROTONIX ) 40 MG tablet    Sig: Take 2 tablets (80 mg total) by mouth daily.    Dispense:  180 tablet    Refill:  3   oxyCODONE  (OXY IR/ROXICODONE ) 5 MG immediate release tablet    Sig: Take 1 tablet (5 mg total) by mouth 3 (three) times daily as needed for severe pain (pain score 7-10).    Dispense:  90 tablet    Refill:  0    Fill for march   oxyCODONE  (OXY IR/ROXICODONE ) 5 MG immediate release tablet    Sig: Take 1 tablet (5 mg total) by mouth 3 (three) times daily as needed for severe pain (pain score 7-10).    Dispense:  90 tablet    Refill:  0    Fill for February   oxyCODONE  (OXY IR/ROXICODONE ) 5 MG immediate release tablet    Sig: Take 1 tablet (5 mg total) by mouth 3 (three) times daily as needed for severe pain (pain score 7-10).    Dispense:  90 tablet    Refill:  0    Fill for august    Return in about 12 weeks (around 10/20/2023) for F/U, pain med refill, Recheck A1C, Clarance Bollard PCP.   Total time spent:30 Minutes Time spent includes review of chart, medications, test results, and follow up plan with the patient.   [place] Controlled Substance Database was reviewed by me.  This patient was seen by Laurence Pons, FNP-C in collaboration with Dr. Verneta Gone as a part of collaborative care agreement.   Zadkiel Dragan R. Bobbi Burow, MSN, FNP-C Internal medicine

## 2023-09-02 ENCOUNTER — Encounter: Payer: Self-pay | Admitting: Nurse Practitioner

## 2023-10-20 ENCOUNTER — Ambulatory Visit (INDEPENDENT_AMBULATORY_CARE_PROVIDER_SITE_OTHER): Admitting: Nurse Practitioner

## 2023-10-20 ENCOUNTER — Encounter: Payer: Self-pay | Admitting: Nurse Practitioner

## 2023-10-20 VITALS — BP 138/68 | HR 77 | Temp 98.3°F | Resp 16 | Ht 70.0 in | Wt 204.6 lb

## 2023-10-20 DIAGNOSIS — E1159 Type 2 diabetes mellitus with other circulatory complications: Secondary | ICD-10-CM

## 2023-10-20 DIAGNOSIS — I152 Hypertension secondary to endocrine disorders: Secondary | ICD-10-CM

## 2023-10-20 DIAGNOSIS — E785 Hyperlipidemia, unspecified: Secondary | ICD-10-CM | POA: Diagnosis not present

## 2023-10-20 DIAGNOSIS — M0579 Rheumatoid arthritis with rheumatoid factor of multiple sites without organ or systems involvement: Secondary | ICD-10-CM

## 2023-10-20 DIAGNOSIS — M5442 Lumbago with sciatica, left side: Secondary | ICD-10-CM

## 2023-10-20 DIAGNOSIS — E119 Type 2 diabetes mellitus without complications: Secondary | ICD-10-CM

## 2023-10-20 DIAGNOSIS — Z125 Encounter for screening for malignant neoplasm of prostate: Secondary | ICD-10-CM | POA: Diagnosis not present

## 2023-10-20 DIAGNOSIS — E1169 Type 2 diabetes mellitus with other specified complication: Secondary | ICD-10-CM | POA: Diagnosis not present

## 2023-10-20 DIAGNOSIS — G8929 Other chronic pain: Secondary | ICD-10-CM | POA: Diagnosis not present

## 2023-10-20 DIAGNOSIS — M5441 Lumbago with sciatica, right side: Secondary | ICD-10-CM

## 2023-10-20 LAB — POCT GLYCOSYLATED HEMOGLOBIN (HGB A1C): Hemoglobin A1C: 5.9 % — AB (ref 4.0–5.6)

## 2023-10-20 MED ORDER — OXYCODONE HCL 5 MG PO TABS: 5.0000 mg | ORAL_TABLET | Freq: Three times a day (TID) | ORAL | 0 refills | Status: DC | PRN

## 2023-10-20 MED ORDER — OLMESARTAN MEDOXOMIL 5 MG PO TABS: 5.0000 mg | ORAL_TABLET | Freq: Every day | ORAL | 1 refills | Status: DC

## 2023-10-20 MED ORDER — TRAMADOL HCL 50 MG PO TABS: 50.0000 mg | ORAL_TABLET | Freq: Every evening | ORAL | 2 refills | Status: DC | PRN

## 2023-10-20 MED ORDER — METFORMIN HCL 500 MG PO TABS: 500.0000 mg | ORAL_TABLET | Freq: Every day | ORAL | 1 refills | Status: DC

## 2023-10-20 MED ORDER — FOLIC ACID 1 MG PO TABS: ORAL_TABLET | ORAL | 1 refills | Status: DC

## 2023-10-20 NOTE — Patient Instructions (Signed)
 STOP amlodipine   START olmesartan 

## 2023-10-20 NOTE — Progress Notes (Signed)
 [facility] [address] [place], [place] [address]  Internal MEDICINE  Office Visit Note  Patient Name: Russell Sullivan  [id number]  [id number]  Date of Service: 10/20/2023  Chief Complaint  Patient presents with   Diabetes   Hyperlipidemia   Hypertension   Follow-up    HPI Russell Sullivan presents for a follow-up visit for diabetes, neuropathy in his feet, and labs and refills  Diabetes -- A1c improved to 5.9, stable.  Having tingling and swelling in both ankles and feet Requesting PSA lab, last PSA was normal in August last year.  Due for routine labs in October.  Chronic pain -- takes oxycodone  as needed and sometimes takes tramadol  as needed at bedtime.     Current Medication: Outpatient Encounter Medications as of 10/20/2023  Medication Sig   olmesartan  (BENICAR ) 5 MG tablet Take 1 tablet (5 mg total) by mouth daily.   Accu-Chek Softclix Lancets lancets Use to check blood sugars twice a week E11.65   albuterol  (VENTOLIN  HFA) 108 (90 Base) MCG/ACT inhaler Inhale 2 puffs into the lungs every 6 (six) hours as needed for wheezing or shortness of breath.   aspirin 81 MG EC tablet Take by mouth.   clindamycin (CLEOCIN T) 1 % external solution Apply topically daily.   fluticasone  (FLONASE ) 50 MCG/ACT nasal spray Place 2 sprays into both nostrils daily.   folic acid  (FOLVITE ) 1 MG tablet TAKE 1 TABLET BY MOUTH ONCE DAILY   glucose blood (ACCU-CHEK GUIDE) test strip check blood sugars twice a day E11.65   hydrocortisone  ointment 0.5 % Apply 1 application topically 2 (two) times daily. Prn only for eczema   meloxicam  (MOBIC ) 15 MG tablet Take 1 tablet (15 mg total) by mouth daily.   metFORMIN  (GLUCOPHAGE ) 500 MG tablet Take 1 tablet (500 mg total) by mouth daily with breakfast.   methotrexate  (RHEUMATREX) 2.5 MG tablet Take 6 tablets (15 mg total) by mouth once a week. Caution:Chemotherapy. Protect from light.   [START ON 12/15/2023] oxyCODONE  (OXY IR/ROXICODONE ) 5 MG immediate  release tablet Take 1 tablet (5 mg total) by mouth 3 (three) times daily as needed for severe pain (pain score 7-10).   [START ON 11/17/2023] oxyCODONE  (OXY IR/ROXICODONE ) 5 MG immediate release tablet Take 1 tablet (5 mg total) by mouth 3 (three) times daily as needed for severe pain (pain score 7-10).   oxyCODONE  (OXY IR/ROXICODONE ) 5 MG immediate release tablet Take 1 tablet (5 mg total) by mouth 3 (three) times daily as needed for severe pain (pain score 7-10).   pantoprazole  (PROTONIX ) 40 MG tablet Take 2 tablets (80 mg total) by mouth daily.   rosuvastatin  (CRESTOR ) 20 MG tablet Take 1 tablet (20 mg total) by mouth at bedtime.   traMADol  (ULTRAM ) 50 MG tablet Take 1 tablet (50 mg total) by mouth at bedtime as needed for moderate pain (pain score 4-6) or severe pain (pain score 7-10).   [DISCONTINUED] amLODipine  (NORVASC ) 2.5 MG tablet Take 1 tablet (2.5 mg total) by mouth daily.   [DISCONTINUED] folic acid  (FOLVITE ) 1 MG tablet folic acid  1 mg tablet  TAKE 1 TABLET BY MOUTH ONCE DAILY   [DISCONTINUED] metFORMIN  (GLUCOPHAGE ) 500 MG tablet Take 1 tablet (500 mg total) by mouth daily with breakfast.   [DISCONTINUED] oxyCODONE  (OXY IR/ROXICODONE ) 5 MG immediate release tablet Take 1 tablet (5 mg total) by mouth 3 (three) times daily as needed for severe pain (pain score 7-10).   [DISCONTINUED] oxyCODONE  (OXY IR/ROXICODONE ) 5 MG immediate release tablet Take  1 tablet (5 mg total) by mouth 3 (three) times daily as needed for severe pain (pain score 7-10).   [DISCONTINUED] oxyCODONE  (OXY IR/ROXICODONE ) 5 MG immediate release tablet Take 1 tablet (5 mg total) by mouth 3 (three) times daily as needed for severe pain (pain score 7-10).   [DISCONTINUED] traMADol  (ULTRAM ) 50 MG tablet Take 1 tablet (50 mg total) by mouth at bedtime as needed for moderate pain (pain score 4-6) or severe pain (pain score 7-10).   No facility-administered encounter medications on file as of 10/20/2023.    Surgical  History: Past Surgical History:  Procedure Laterality Date   HERNIA REPAIR  2009   INTUBATION-ENDOTRACHEAL WITH TRACHEOSTOMY STANDBY N/A 01/13/2018   Procedure: INTUBATION-ENDOTRACHEAL WITH TRACHEOSTOMY STANDBY;  Surgeon: Herminio Miu, MD;  Location: [facility] ORS;  Service: ENT;  Laterality: N/A;   left shoulder surgery      Medical History: Past Medical History:  Diagnosis Date   Adenocarcinoma of prostate (HCC)    Diabetes mellitus without complication (HCC)    Hyperlipidemia    Hypertension     Family History: Family History  Problem Relation Age of Onset   Hypertension Mother    Diabetes Mother    Hypertension Father    Diabetes Maternal Grandmother    Diabetes Paternal Grandmother     Social History   Socioeconomic History   Marital status: Married    Spouse name: Not on file   Number of children: Not on file   Years of education: Not on file   Highest education level: Not on file  Occupational History   Not on file  Tobacco Use   Smoking status: Former    Types: Cigarettes   Smokeless tobacco: Never   Tobacco comments:    20 year ago  Substance and Sexual Activity   Alcohol use: Yes    Comment: ocassionally   Drug use: No   Sexual activity: Not on file  Other Topics Concern   Not on file  Social History Narrative   Not on file   Social Drivers of Health   Financial Resource Strain: Low Risk  (02/07/2023)   Overall Financial Resource Strain (CARDIA)    Difficulty of Paying Living Expenses: Not hard at all  Food Insecurity: No Food Insecurity (02/07/2023)   Hunger Vital Sign    Worried About Running Out of Food in the Last Year: Never true    Ran Out of Food in the Last Year: Never true  Transportation Needs: No Transportation Needs (02/07/2023)   PRAPARE - Administrator, Civil Service (Medical): No    Lack of Transportation (Non-Medical): No  Physical Activity: Insufficiently Active (02/07/2023)   Exercise Vital Sign    Days of  Exercise per Week: 4 days    Minutes of Exercise per Session: 30 min  Stress: No Stress Concern Present (02/07/2023)   [employer] of Occupational Health - Occupational Stress Questionnaire    Feeling of Stress : Only a little  Social Connections: Moderately Isolated (02/14/2023)   Social Connection and Isolation Panel    Frequency of Communication with Friends and Family: Once a week    Frequency of Social Gatherings with Friends and Family: Once a week    Attends Religious Services: 1 to 4 times per year    Active Member of [employer] or Organizations: No    Attends Banker Meetings: Never    Marital Status: Married  Catering manager Violence: Not At Risk (02/07/2023)   Humiliation,  Afraid, Rape, and Kick questionnaire    Fear of Current or Ex-Partner: No    Emotionally Abused: No    Physically Abused: No    Sexually Abused: No      Review of Systems  Constitutional:  Negative for chills, fatigue and unexpected weight change.  HENT:  Negative for congestion, rhinorrhea, sneezing and sore throat.   Eyes:  Negative for redness.  Respiratory:  Negative for cough, chest tightness and shortness of breath.   Cardiovascular:  Negative for chest pain and palpitations.  Gastrointestinal:  Negative for abdominal pain, constipation, diarrhea, nausea and vomiting.  Genitourinary:  Negative for dysuria and frequency.  Musculoskeletal:  Positive for arthralgias. Negative for back pain, joint swelling and neck pain.  Skin:  Negative for rash.  Neurological: Negative.  Negative for tremors and numbness.  Hematological:  Negative for adenopathy. Does not bruise/bleed easily.  Psychiatric/Behavioral:  Negative for behavioral problems (Depression), sleep disturbance and suicidal ideas. The patient is not nervous/anxious.     Vital Signs: BP 138/68   Pulse 77   Temp 98.3 F (36.8 C)   Resp 16   Ht 5' 10 (1.778 m)   Wt 204 lb 9.6 oz (92.8 kg)   SpO2 97%   BMI 29.36 kg/m     Physical Exam Vitals reviewed.  Constitutional:      General: He is not in acute distress.    Appearance: Normal appearance. He is not ill-appearing.  HENT:     Head: Normocephalic and atraumatic.  Eyes:     Pupils: Pupils are equal, round, and reactive to light.  Cardiovascular:     Rate and Rhythm: Normal rate and regular rhythm.  Pulmonary:     Effort: Pulmonary effort is normal. No respiratory distress.  Neurological:     Mental Status: He is alert and oriented to person, place, and time.  Psychiatric:        Mood and Affect: Mood normal.        Behavior: Behavior normal.        Assessment/Plan: 1. Type 2 diabetes mellitus with other specified complication, without long-term current use of insulin (HCC) (Primary) A1c is stable and further improved to 5.9 today. Continue metformin  as prescribed. Routine labs ordered.  - POCT glycosylated hemoglobin (Hb A1C) - metFORMIN  (GLUCOPHAGE ) 500 MG tablet; Take 1 tablet (500 mg total) by mouth daily with breakfast.  Dispense: 90 tablet; Refill: 1 - CBC with Differential/Platelet - CMP14+EGFR - Lipid Profile  2. Hypertension associated with type 2 diabetes mellitus (HCC) Stable BP but he has been having some swelling of his lower extremities. Discontinue amlodipine  and switch to olmesartan  5 mg daily.  - olmesartan  (BENICAR ) 5 MG tablet; Take 1 tablet (5 mg total) by mouth daily.  Dispense: 90 tablet; Refill: 1  3. Hyperlipidemia associated with type 2 diabetes mellitus (HCC) Routine labs ordered - CBC with Differential/Platelet - CMP14+EGFR - Lipid Profile  4. Rheumatoid arthritis involving multiple sites with positive rheumatoid factor (HCC) Continue prn oxycodone  as prescribed, follow up in 3 months for additional refills - oxyCODONE  (OXY IR/ROXICODONE ) 5 MG immediate release tablet; Take 1 tablet (5 mg total) by mouth 3 (three) times daily as needed for severe pain (pain score 7-10).  Dispense: 90 tablet; Refill: 0 -  oxyCODONE  (OXY IR/ROXICODONE ) 5 MG immediate release tablet; Take 1 tablet (5 mg total) by mouth 3 (three) times daily as needed for severe pain (pain score 7-10).  Dispense: 90 tablet; Refill: 0 - oxyCODONE  (OXY IR/ROXICODONE )  5 MG immediate release tablet; Take 1 tablet (5 mg total) by mouth 3 (three) times daily as needed for severe pain (pain score 7-10).  Dispense: 90 tablet; Refill: 0 - folic acid  (FOLVITE ) 1 MG tablet; TAKE 1 TABLET BY MOUTH ONCE DAILY  Dispense: 90 tablet; Refill: 1  5. Chronic bilateral low back pain with bilateral sciatica Continue prn oxycodone  as prescribed, follow up in 3 months for additional refills - oxyCODONE  (OXY IR/ROXICODONE ) 5 MG immediate release tablet; Take 1 tablet (5 mg total) by mouth 3 (three) times daily as needed for severe pain (pain score 7-10).  Dispense: 90 tablet; Refill: 0 - oxyCODONE  (OXY IR/ROXICODONE ) 5 MG immediate release tablet; Take 1 tablet (5 mg total) by mouth 3 (three) times daily as needed for severe pain (pain score 7-10).  Dispense: 90 tablet; Refill: 0 - oxyCODONE  (OXY IR/ROXICODONE ) 5 MG immediate release tablet; Take 1 tablet (5 mg total) by mouth 3 (three) times daily as needed for severe pain (pain score 7-10).  Dispense: 90 tablet; Refill: 0 - traMADol  (ULTRAM ) 50 MG tablet; Take 1 tablet (50 mg total) by mouth at bedtime as needed for moderate pain (pain score 4-6) or severe pain (pain score 7-10).  Dispense: 30 tablet; Refill: 2  6. Screening for prostate cancer PSA lab ordered  - PSA Total (Reflex To Free)   General Counseling: Lamontae verbalizes understanding of the findings of todays visit and agrees with plan of treatment. I have discussed any further diagnostic evaluation that may be needed or ordered today. We also reviewed his medications today. he has been encouraged to call the office with any questions or concerns that should arise related to todays visit.    Orders Placed This Encounter  Procedures   PSA Total  (Reflex To Free)   CBC with Differential/Platelet   CMP14+EGFR   Lipid Profile   POCT glycosylated hemoglobin (Hb A1C)    Meds ordered this encounter  Medications   oxyCODONE  (OXY IR/ROXICODONE ) 5 MG immediate release tablet    Sig: Take 1 tablet (5 mg total) by mouth 3 (three) times daily as needed for severe pain (pain score 7-10).    Dispense:  90 tablet    Refill:  0    Fill for september   oxyCODONE  (OXY IR/ROXICODONE ) 5 MG immediate release tablet    Sig: Take 1 tablet (5 mg total) by mouth 3 (three) times daily as needed for severe pain (pain score 7-10).    Dispense:  90 tablet    Refill:  0    Fill for august   oxyCODONE  (OXY IR/ROXICODONE ) 5 MG immediate release tablet    Sig: Take 1 tablet (5 mg total) by mouth 3 (three) times daily as needed for severe pain (pain score 7-10).    Dispense:  90 tablet    Refill:  0    Fill for july   metFORMIN  (GLUCOPHAGE ) 500 MG tablet    Sig: Take 1 tablet (500 mg total) by mouth daily with breakfast.    Dispense:  90 tablet    Refill:  1   folic acid  (FOLVITE ) 1 MG tablet    Sig: TAKE 1 TABLET BY MOUTH ONCE DAILY    Dispense:  90 tablet    Refill:  1   olmesartan  (BENICAR ) 5 MG tablet    Sig: Take 1 tablet (5 mg total) by mouth daily.    Dispense:  90 tablet    Refill:  1    Discontinue amlodipine  and  fill new script today   traMADol  (ULTRAM ) 50 MG tablet    Sig: Take 1 tablet (50 mg total) by mouth at bedtime as needed for moderate pain (pain score 4-6) or severe pain (pain score 7-10).    Dispense:  30 tablet    Refill:  2    Discontinue all previous tramadol  ordered and for future refills, keep new script on file.    Return in about 3 months (around 01/11/2024) for previously scheduled, AWV, pain med refill, Ercel Pepitone PCP at the end of october. .   Total time spent:30 Minutes Time spent includes review of chart, medications, test results, and follow up plan with the patient.   [place] Controlled Substance Database was reviewed  by me.  This patient was seen by Mardy Maxin, FNP-C in collaboration with Dr. Sigrid Bathe as a part of collaborative care agreement.   Oletta Buehring R. Maxin, MSN, FNP-C Internal medicine

## 2023-10-21 LAB — PSA TOTAL (REFLEX TO FREE): Prostate Specific Ag, Serum: 0.2 ng/mL (ref 0.0–4.0)

## 2023-10-23 ENCOUNTER — Encounter: Payer: Self-pay | Admitting: Nurse Practitioner

## 2023-11-02 DIAGNOSIS — L821 Other seborrheic keratosis: Secondary | ICD-10-CM | POA: Diagnosis not present

## 2023-11-02 DIAGNOSIS — D485 Neoplasm of uncertain behavior of skin: Secondary | ICD-10-CM | POA: Diagnosis not present

## 2023-11-02 DIAGNOSIS — D225 Melanocytic nevi of trunk: Secondary | ICD-10-CM | POA: Diagnosis not present

## 2023-11-02 DIAGNOSIS — D2272 Melanocytic nevi of left lower limb, including hip: Secondary | ICD-10-CM | POA: Diagnosis not present

## 2023-11-02 DIAGNOSIS — L905 Scar conditions and fibrosis of skin: Secondary | ICD-10-CM | POA: Diagnosis not present

## 2023-11-02 DIAGNOSIS — D2262 Melanocytic nevi of left upper limb, including shoulder: Secondary | ICD-10-CM | POA: Diagnosis not present

## 2023-11-02 DIAGNOSIS — D2261 Melanocytic nevi of right upper limb, including shoulder: Secondary | ICD-10-CM | POA: Diagnosis not present

## 2023-11-02 DIAGNOSIS — D2271 Melanocytic nevi of right lower limb, including hip: Secondary | ICD-10-CM | POA: Diagnosis not present

## 2023-11-02 DIAGNOSIS — L570 Actinic keratosis: Secondary | ICD-10-CM | POA: Diagnosis not present

## 2023-11-14 ENCOUNTER — Other Ambulatory Visit: Payer: Self-pay | Admitting: Nurse Practitioner

## 2023-11-14 NOTE — Progress Notes (Signed)
 error

## 2023-11-16 DIAGNOSIS — L570 Actinic keratosis: Secondary | ICD-10-CM | POA: Diagnosis not present

## 2023-11-24 ENCOUNTER — Telehealth: Payer: Self-pay

## 2023-11-24 NOTE — Telephone Encounter (Signed)
 Spoke with that need diabetic eye please call your eye dr to schedule appt

## 2023-11-30 DIAGNOSIS — E119 Type 2 diabetes mellitus without complications: Secondary | ICD-10-CM | POA: Diagnosis not present

## 2024-01-21 ENCOUNTER — Other Ambulatory Visit: Payer: Self-pay | Admitting: Nurse Practitioner

## 2024-01-21 DIAGNOSIS — G8929 Other chronic pain: Secondary | ICD-10-CM

## 2024-01-23 ENCOUNTER — Other Ambulatory Visit: Payer: Self-pay | Admitting: Nurse Practitioner

## 2024-01-23 ENCOUNTER — Telehealth: Payer: Self-pay

## 2024-01-23 DIAGNOSIS — G8929 Other chronic pain: Secondary | ICD-10-CM

## 2024-01-23 DIAGNOSIS — M0579 Rheumatoid arthritis with rheumatoid factor of multiple sites without organ or systems involvement: Secondary | ICD-10-CM

## 2024-01-23 MED ORDER — OXYCODONE HCL 5 MG PO TABS
5.0000 mg | ORAL_TABLET | Freq: Three times a day (TID) | ORAL | 0 refills | Status: DC | PRN
Start: 2024-01-23 — End: 2024-02-08

## 2024-01-24 NOTE — Progress Notes (Signed)
 Left message for patient to give office a call.

## 2024-01-24 NOTE — Progress Notes (Signed)
 Patient called back and was given the message

## 2024-01-24 NOTE — Telephone Encounter (Signed)
 done

## 2024-02-08 ENCOUNTER — Encounter: Payer: Self-pay | Admitting: Nurse Practitioner

## 2024-02-08 ENCOUNTER — Ambulatory Visit: Payer: Medicare HMO | Admitting: Nurse Practitioner

## 2024-02-08 VITALS — BP 136/84 | HR 74 | Temp 98.1°F | Resp 16 | Ht 70.0 in | Wt 207.2 lb

## 2024-02-08 DIAGNOSIS — Z0001 Encounter for general adult medical examination with abnormal findings: Secondary | ICD-10-CM | POA: Diagnosis not present

## 2024-02-08 DIAGNOSIS — I152 Hypertension secondary to endocrine disorders: Secondary | ICD-10-CM | POA: Diagnosis not present

## 2024-02-08 DIAGNOSIS — M5442 Lumbago with sciatica, left side: Secondary | ICD-10-CM | POA: Diagnosis not present

## 2024-02-08 DIAGNOSIS — E1169 Type 2 diabetes mellitus with other specified complication: Secondary | ICD-10-CM

## 2024-02-08 DIAGNOSIS — Z23 Encounter for immunization: Secondary | ICD-10-CM | POA: Diagnosis not present

## 2024-02-08 DIAGNOSIS — M5441 Lumbago with sciatica, right side: Secondary | ICD-10-CM

## 2024-02-08 DIAGNOSIS — M0579 Rheumatoid arthritis with rheumatoid factor of multiple sites without organ or systems involvement: Secondary | ICD-10-CM

## 2024-02-08 DIAGNOSIS — E785 Hyperlipidemia, unspecified: Secondary | ICD-10-CM | POA: Diagnosis not present

## 2024-02-08 DIAGNOSIS — E1159 Type 2 diabetes mellitus with other circulatory complications: Secondary | ICD-10-CM | POA: Diagnosis not present

## 2024-02-08 DIAGNOSIS — C61 Malignant neoplasm of prostate: Secondary | ICD-10-CM

## 2024-02-08 DIAGNOSIS — G8929 Other chronic pain: Secondary | ICD-10-CM

## 2024-02-08 MED ORDER — METFORMIN HCL 500 MG PO TABS: 500.0000 mg | ORAL_TABLET | Freq: Two times a day (BID) | ORAL | 1 refills | Status: DC

## 2024-02-08 MED ORDER — OXYCODONE HCL 5 MG PO TABS: 5.0000 mg | ORAL_TABLET | Freq: Three times a day (TID) | ORAL | 0 refills | Status: DC | PRN

## 2024-02-08 MED ORDER — FOLIC ACID 1 MG PO TABS: ORAL_TABLET | ORAL | 1 refills | Status: DC

## 2024-02-08 MED ORDER — OLMESARTAN MEDOXOMIL 5 MG PO TABS: 5.0000 mg | ORAL_TABLET | Freq: Every day | ORAL | 1 refills | Status: DC

## 2024-02-08 MED ORDER — METHOTREXATE SODIUM 2.5 MG PO TABS: 15.0000 mg | ORAL_TABLET | ORAL | 1 refills | Status: DC

## 2024-02-08 MED ORDER — TRAMADOL HCL 50 MG PO TABS: 50.0000 mg | ORAL_TABLET | Freq: Every evening | ORAL | 0 refills | Status: DC | PRN

## 2024-02-08 NOTE — Progress Notes (Signed)
 [facility] [address] [place], [place] [address]  Internal MEDICINE  Office Visit Note  Patient Name: Russell Sullivan  [id number]  [id number]  Date of Service: 02/08/2024  Chief Complaint  Patient presents with   Diabetes   Hypertension   Hyperlipidemia   Medicare Wellness    HPI Cypher presents for an annual well visit and physical exam.  Well-appearing 73 y.o. male with hypertension, GERD, RA, high cholesterol and prostate cancer s/p prostatectomy  -Taking 6 tabs of methotrexate  once weekly. Continues to take tramadol  and oxycodone  prn to help manage his pain. Routine CRC screening: Eye exam: sees an eye doctor regularly  foot exam: done  Labs: reminded patient to get labs drawn soon  New or worsening pain: chronic pain, manageable with current medications  Other concerns: none      02/08/2024   10:24 AM 02/07/2023    9:00 AM 02/01/2022    9:05 AM  MMSE - Mini Mental State Exam  Orientation to time 5 5 5   Orientation to Place 5 5 5   Registration 3 3 3   Attention/ Calculation 5 5 5   Recall 3 3 3   Language- name 2 objects 2 2 2   Language- repeat 1 1 1   Language- follow 3 step command 3 3 3   Language- read & follow direction 1 1 1   Write a sentence 1 1 1   Copy design 1 1 1   Total score 30 30 30     Functional Status Survey: Is the patient deaf or have difficulty hearing?: No Does the patient have difficulty seeing, even when wearing glasses/contacts?: No Does the patient have difficulty concentrating, remembering, or making decisions?: No Does the patient have difficulty walking or climbing stairs?: No Does the patient have difficulty dressing or bathing?: No Does the patient have difficulty doing errands alone such as visiting a doctor's office or shopping?: No     11/12/2021    8:41 AM 02/01/2022    9:02 AM 05/06/2022    8:18 AM 02/07/2023    8:58 AM 02/08/2024   10:23 AM  Fall Risk  Falls in the past year? 0 0 0 0 0  Was there an injury  with Fall?  0 0 0 0  Fall Risk Category Calculator  0 0 0 0  Fall Risk Category (Retired)  Low      (RETIRED) Patient Fall Risk Level Low fall risk  Low fall risk      Patient at Risk for Falls Due to No Fall Risks No Fall Risks No Fall Risks No Fall Risks No Fall Risks  Fall risk Follow up Falls evaluation completed  Falls evaluation completed  Falls evaluation completed  Falls evaluation completed Falls evaluation completed     Data saved with a previous flowsheet row definition       02/08/2024   10:23 AM  Depression screen PHQ 2/9  Decreased Interest 0  Down, Depressed, Hopeless 0  PHQ - 2 Score 0        Current Medication: Outpatient Encounter Medications as of 02/08/2024  Medication Sig   Accu-Chek Softclix Lancets lancets Use to check blood sugars twice a week E11.65   albuterol  (VENTOLIN  HFA) 108 (90 Base) MCG/ACT inhaler Inhale 2 puffs into the lungs every 6 (six) hours as needed for wheezing or shortness of breath.   aspirin 81 MG EC tablet Take by mouth.   clindamycin (CLEOCIN T) 1 % external solution Apply topically daily.   fluticasone  (FLONASE ) 50 MCG/ACT nasal  spray Place 2 sprays into both nostrils daily.   folic acid  (FOLVITE ) 1 MG tablet TAKE 1 TABLET BY MOUTH ONCE DAILY   glucose blood (ACCU-CHEK GUIDE) test strip check blood sugars twice a day E11.65   hydrocortisone  ointment 0.5 % Apply 1 application topically 2 (two) times daily. Prn only for eczema   meloxicam  (MOBIC ) 15 MG tablet Take 1 tablet (15 mg total) by mouth daily.   metFORMIN  (GLUCOPHAGE ) 500 MG tablet Take 1 tablet (500 mg total) by mouth 2 (two) times daily with a meal.   methotrexate  (RHEUMATREX) 2.5 MG tablet Take 6 tablets (15 mg total) by mouth once a week. Caution:Chemotherapy. Protect from light.   olmesartan  (BENICAR ) 5 MG tablet Take 1 tablet (5 mg total) by mouth daily.   [START ON 04/04/2024] oxyCODONE  (OXY IR/ROXICODONE ) 5 MG immediate release tablet Take 1 tablet (5 mg total) by  mouth 3 (three) times daily as needed for severe pain (pain score 7-10).   [START ON 03/07/2024] oxyCODONE  (OXY IR/ROXICODONE ) 5 MG immediate release tablet Take 1 tablet (5 mg total) by mouth 3 (three) times daily as needed for severe pain (pain score 7-10).   oxyCODONE  (OXY IR/ROXICODONE ) 5 MG immediate release tablet Take 1 tablet (5 mg total) by mouth 3 (three) times daily as needed for severe pain (pain score 7-10).   pantoprazole  (PROTONIX ) 40 MG tablet Take 2 tablets (80 mg total) by mouth daily.   rosuvastatin  (CRESTOR ) 20 MG tablet Take 1 tablet (20 mg total) by mouth at bedtime.   traMADol  (ULTRAM ) 50 MG tablet Take 1 tablet (50 mg total) by mouth at bedtime as needed for moderate pain (pain score 4-6) or severe pain (pain score 7-10).   [DISCONTINUED] folic acid  (FOLVITE ) 1 MG tablet TAKE 1 TABLET BY MOUTH ONCE DAILY   [DISCONTINUED] metFORMIN  (GLUCOPHAGE ) 500 MG tablet Take 1 tablet (500 mg total) by mouth daily with breakfast.   [DISCONTINUED] methotrexate  (RHEUMATREX) 2.5 MG tablet Take 6 tablets (15 mg total) by mouth once a week. Caution:Chemotherapy. Protect from light.   [DISCONTINUED] olmesartan  (BENICAR ) 5 MG tablet Take 1 tablet (5 mg total) by mouth daily.   [DISCONTINUED] oxyCODONE  (OXY IR/ROXICODONE ) 5 MG immediate release tablet Take 1 tablet (5 mg total) by mouth 3 (three) times daily as needed for severe pain (pain score 7-10).   [DISCONTINUED] oxyCODONE  (OXY IR/ROXICODONE ) 5 MG immediate release tablet Take 1 tablet (5 mg total) by mouth 3 (three) times daily as needed for severe pain (pain score 7-10).   [DISCONTINUED] oxyCODONE  (OXY IR/ROXICODONE ) 5 MG immediate release tablet Take 1 tablet (5 mg total) by mouth 3 (three) times daily as needed for severe pain (pain score 7-10).   [DISCONTINUED] traMADol  (ULTRAM ) 50 MG tablet Take 1 tablet (50 mg total) by mouth at bedtime as needed for moderate pain (pain score 4-6) or severe pain (pain score 7-10).   No  facility-administered encounter medications on file as of 02/08/2024.    Surgical History: Past Surgical History:  Procedure Laterality Date   HERNIA REPAIR  2009   INTUBATION-ENDOTRACHEAL WITH TRACHEOSTOMY STANDBY N/A 01/13/2018   Procedure: INTUBATION-ENDOTRACHEAL WITH TRACHEOSTOMY STANDBY;  Surgeon: Herminio Miu, MD;  Location: [facility] ORS;  Service: ENT;  Laterality: N/A;   left shoulder surgery      Medical History: Past Medical History:  Diagnosis Date   Adenocarcinoma of prostate (HCC)    Diabetes mellitus without complication (HCC)    Hyperlipidemia    Hypertension     Family History: Family  History  Problem Relation Age of Onset   Hypertension Mother    Diabetes Mother    Hypertension Father    Diabetes Maternal Grandmother    Diabetes Paternal Grandmother     Social History   Socioeconomic History   Marital status: Married    Spouse name: Not on file   Number of children: Not on file   Years of education: Not on file   Highest education level: Not on file  Occupational History   Not on file  Tobacco Use   Smoking status: Former    Types: Cigarettes   Smokeless tobacco: Never   Tobacco comments:    20 year ago  Substance and Sexual Activity   Alcohol use: Yes    Comment: ocassionally   Drug use: No   Sexual activity: Not on file  Other Topics Concern   Not on file  Social History Narrative   Not on file   Social Drivers of Health   Financial Resource Strain: Low Risk  (02/07/2023)   Overall Financial Resource Strain (CARDIA)    Difficulty of Paying Living Expenses: Not hard at all  Food Insecurity: No Food Insecurity (02/07/2023)   Hunger Vital Sign    Worried About Running Out of Food in the Last Year: Never true    Ran Out of Food in the Last Year: Never true  Transportation Needs: No Transportation Needs (02/07/2023)   PRAPARE - Administrator, Civil Service (Medical): No    Lack of Transportation (Non-Medical): No  Physical  Activity: Insufficiently Active (02/07/2023)   Exercise Vital Sign    Days of Exercise per Week: 4 days    Minutes of Exercise per Session: 30 min  Stress: No Stress Concern Present (02/07/2023)   [employer] of Occupational Health - Occupational Stress Questionnaire    Feeling of Stress : Only a little  Social Connections: Moderately Isolated (02/14/2023)   Social Connection and Isolation Panel    Frequency of Communication with Friends and Family: Once a week    Frequency of Social Gatherings with Friends and Family: Once a week    Attends Religious Services: 1 to 4 times per year    Active Member of [employer] or Organizations: No    Attends Banker Meetings: Never    Marital Status: Married  Catering Manager Violence: Not At Risk (02/07/2023)   Humiliation, Afraid, Rape, and Kick questionnaire    Fear of Current or Ex-Partner: No    Emotionally Abused: No    Physically Abused: No    Sexually Abused: No      Review of Systems  Constitutional:  Negative for chills, fatigue and unexpected weight change.  HENT:  Negative for congestion, rhinorrhea, sneezing and sore throat.   Eyes:  Negative for redness.  Respiratory:  Negative for cough, chest tightness and shortness of breath.   Cardiovascular:  Negative for chest pain and palpitations.  Gastrointestinal:  Negative for abdominal pain, constipation, diarrhea, nausea and vomiting.  Genitourinary:  Negative for dysuria and frequency.  Musculoskeletal:  Positive for arthralgias and back pain. Negative for joint swelling and neck pain.  Skin: Negative.  Negative for rash.  Neurological: Negative.  Negative for tremors and numbness.  Hematological:  Negative for adenopathy. Does not bruise/bleed easily.  Psychiatric/Behavioral:  Negative for behavioral problems (Depression), sleep disturbance and suicidal ideas. The patient is not nervous/anxious.     Vital Signs: BP 136/84   Pulse 74   Temp 98.1 F (36.7  C)    Resp 16   Ht 5' 10 (1.778 m)   Wt 207 lb 3.2 oz (94 kg)   SpO2 97%   BMI 29.73 kg/m    Physical Exam Vitals and nursing note reviewed.  Constitutional:      General: He is awake. He is not in acute distress.    Appearance: Normal appearance. He is well-developed and well-groomed. He is obese. He is not ill-appearing or diaphoretic.  HENT:     Head: Normocephalic and atraumatic.     Right Ear: Tympanic membrane, ear canal and external ear normal.     Left Ear: Tympanic membrane, ear canal and external ear normal.     Nose: Nose normal. No congestion or rhinorrhea.     Mouth/Throat:     Lips: Pink.     Mouth: Mucous membranes are moist.     Pharynx: Oropharynx is clear. Uvula midline. No oropharyngeal exudate or posterior oropharyngeal erythema.  Eyes:     General: Lids are normal. Vision grossly intact. Gaze aligned appropriately. No scleral icterus.       Right eye: No discharge.        Left eye: No discharge.     Extraocular Movements: Extraocular movements intact.     Conjunctiva/sclera: Conjunctivae normal.     Pupils: Pupils are equal, round, and reactive to light.     Funduscopic exam:    Right eye: Red reflex present.        Left eye: Red reflex present. Neck:     Thyroid : No thyromegaly.     Vascular: No carotid bruit or JVD.     Trachea: Trachea and phonation normal. No tracheal deviation.  Cardiovascular:     Rate and Rhythm: Normal rate and regular rhythm.     Pulses: Normal pulses.     Heart sounds: Normal heart sounds, S1 normal and S2 normal. No murmur heard.    No friction rub. No gallop.  Pulmonary:     Effort: Pulmonary effort is normal. No accessory muscle usage or respiratory distress.     Breath sounds: Normal breath sounds and air entry. No stridor. No wheezing or rales.  Chest:     Chest wall: No tenderness.  Abdominal:     General: Bowel sounds are normal. There is no distension.     Palpations: Abdomen is soft. There is no mass.     Tenderness:  There is no abdominal tenderness. There is no guarding or rebound.  Musculoskeletal:        General: No tenderness or deformity. Normal range of motion.     Cervical back: Normal range of motion and neck supple.     Right lower leg: No edema.     Left lower leg: No edema.  Lymphadenopathy:     Cervical: No cervical adenopathy.  Skin:    General: Skin is warm and dry.     Capillary Refill: Capillary refill takes less than 2 seconds.     Coloration: Skin is not pale.     Findings: No erythema, lesion or rash.  Neurological:     Mental Status: He is alert and oriented to person, place, and time.     Cranial Nerves: No cranial nerve deficit.     Motor: No abnormal muscle tone.     Coordination: Coordination normal.     Gait: Gait normal.     Deep Tendon Reflexes: Reflexes are normal and symmetric.  Psychiatric:        Mood  and Affect: Mood normal.        Behavior: Behavior normal. Behavior is cooperative.        Thought Content: Thought content normal.        Judgment: Judgment normal.        Assessment/Plan: 1. Encounter for Medicare annual examination with abnormal findings (Primary) Age-appropriate preventive screenings and vaccinations discussed. Routine labs for health maintenance previously ordered, reminded patient to have his labs drawn. PHM updated.    2. Type 2 diabetes mellitus with other specified complication, without long-term current use of insulin (HCC) Repeat A1c with labs. Continue metformin  as prescribed.  - metFORMIN  (GLUCOPHAGE ) 500 MG tablet; Take 1 tablet (500 mg total) by mouth 2 (two) times daily with a meal.  Dispense: 180 tablet; Refill: 1 - Hgb A1C w/o eAG  3. Hypertension associated with type 2 diabetes mellitus (HCC) Stable, Continue olmesartan  as prescribed.  - olmesartan  (BENICAR ) 5 MG tablet; Take 1 tablet (5 mg total) by mouth daily.  Dispense: 90 tablet; Refill: 1  4. Hyperlipidemia associated with type 2 diabetes mellitus (HCC) Continue  rosuvastatin  as prescribed   5. Rheumatoid arthritis involving multiple sites with positive rheumatoid factor (HCC) Continue methotrexate , folic aid and prn oxycodone  as prescribed. Follow up in 3 months for additional refills of oxycodone . UDS due next visit.  - oxyCODONE  (OXY IR/ROXICODONE ) 5 MG immediate release tablet; Take 1 tablet (5 mg total) by mouth 3 (three) times daily as needed for severe pain (pain score 7-10).  Dispense: 90 tablet; Refill: 0 - oxyCODONE  (OXY IR/ROXICODONE ) 5 MG immediate release tablet; Take 1 tablet (5 mg total) by mouth 3 (three) times daily as needed for severe pain (pain score 7-10).  Dispense: 90 tablet; Refill: 0 - oxyCODONE  (OXY IR/ROXICODONE ) 5 MG immediate release tablet; Take 1 tablet (5 mg total) by mouth 3 (three) times daily as needed for severe pain (pain score 7-10).  Dispense: 90 tablet; Refill: 0 - methotrexate  (RHEUMATREX) 2.5 MG tablet; Take 6 tablets (15 mg total) by mouth once a week. Caution:Chemotherapy. Protect from light.  Dispense: 84 tablet; Refill: 1 - folic acid  (FOLVITE ) 1 MG tablet; TAKE 1 TABLET BY MOUTH ONCE DAILY  Dispense: 90 tablet; Refill: 1  6. Chronic bilateral low back pain with bilateral sciatica Continue prn bedtime tramadol  and prn oxycodone  as prescribed. Patient understand not to take these medications together. Follow up in 3 months for additional refills. UDS due at next visit.  - traMADol  (ULTRAM ) 50 MG tablet; Take 1 tablet (50 mg total) by mouth at bedtime as needed for moderate pain (pain score 4-6) or severe pain (pain score 7-10).  Dispense: 30 tablet; Refill: 0 - oxyCODONE  (OXY IR/ROXICODONE ) 5 MG immediate release tablet; Take 1 tablet (5 mg total) by mouth 3 (three) times daily as needed for severe pain (pain score 7-10).  Dispense: 90 tablet; Refill: 0 - oxyCODONE  (OXY IR/ROXICODONE ) 5 MG immediate release tablet; Take 1 tablet (5 mg total) by mouth 3 (three) times daily as needed for severe pain (pain score 7-10).   Dispense: 90 tablet; Refill: 0 - oxyCODONE  (OXY IR/ROXICODONE ) 5 MG immediate release tablet; Take 1 tablet (5 mg total) by mouth 3 (three) times daily as needed for severe pain (pain score 7-10).  Dispense: 90 tablet; Refill: 0  7. History of prostate cancer S/p prostatectomy. No issues at this time.   8. Flu vaccine need Flu vaccine administered in office today - Influenza, MDCK, trivalent, PF(Flucelvax egg-free)      General  Counseling: Aaric verbalizes understanding of the findings of todays visit and agrees with plan of treatment. I have discussed any further diagnostic evaluation that may be needed or ordered today. We also reviewed his medications today. he has been encouraged to call the office with any questions or concerns that should arise related to todays visit.    Orders Placed This Encounter  Procedures   Influenza, MDCK, trivalent, PF(Flucelvax egg-free)   Hgb A1C w/o eAG    Meds ordered this encounter  Medications   traMADol  (ULTRAM ) 50 MG tablet    Sig: Take 1 tablet (50 mg total) by mouth at bedtime as needed for moderate pain (pain score 4-6) or severe pain (pain score 7-10).    Dispense:  30 tablet    Refill:  0    For future refills   oxyCODONE  (OXY IR/ROXICODONE ) 5 MG immediate release tablet    Sig: Take 1 tablet (5 mg total) by mouth 3 (three) times daily as needed for severe pain (pain score 7-10).    Dispense:  90 tablet    Refill:  0    Fill for December 24   oxyCODONE  (OXY IR/ROXICODONE ) 5 MG immediate release tablet    Sig: Take 1 tablet (5 mg total) by mouth 3 (three) times daily as needed for severe pain (pain score 7-10).    Dispense:  90 tablet    Refill:  0    Fill for November 26   oxyCODONE  (OXY IR/ROXICODONE ) 5 MG immediate release tablet    Sig: Take 1 tablet (5 mg total) by mouth 3 (three) times daily as needed for severe pain (pain score 7-10).    Dispense:  90 tablet    Refill:  0    Fill for October 29   metFORMIN  (GLUCOPHAGE )  500 MG tablet    Sig: Take 1 tablet (500 mg total) by mouth 2 (two) times daily with a meal.    Dispense:  180 tablet    Refill:  1    For future refills, note increased frequency   methotrexate  (RHEUMATREX) 2.5 MG tablet    Sig: Take 6 tablets (15 mg total) by mouth once a week. Caution:Chemotherapy. Protect from light.    Dispense:  84 tablet    Refill:  1   olmesartan  (BENICAR ) 5 MG tablet    Sig: Take 1 tablet (5 mg total) by mouth daily.    Dispense:  90 tablet    Refill:  1    Discontinue amlodipine  and fill new script today   folic acid  (FOLVITE ) 1 MG tablet    Sig: TAKE 1 TABLET BY MOUTH ONCE DAILY    Dispense:  90 tablet    Refill:  1    Return in about 12 weeks (around 05/02/2024) for F/U, pain med refill, Labs, Yoltzin Barg PCP.   Total time spent:30 Minutes Time spent includes review of chart, medications, test results, and follow up plan with the patient.   [place] Controlled Substance Database was reviewed by me.  This patient was seen by Mardy Maxin, FNP-C in collaboration with Dr. Sigrid Bathe as a part of collaborative care agreement.  Sakshi Sermons R. Maxin, MSN, FNP-C Internal medicine

## 2024-03-02 ENCOUNTER — Encounter: Payer: Self-pay | Admitting: Nurse Practitioner

## 2024-03-27 NOTE — Progress Notes (Signed)
 Russell Sullivan                                          MRN: [id number]   03/27/2024   The [facility] Quality Team Specialist reviewed this patient medical record for the purposes of chart review for care gap closure. The following were reviewed: chart review for care gap closure-kidney health evaluation for diabetes:eGFR . EGFR labs not found. UACR is available but cannot close KED gap without egfr.     [facility] Quality Team

## 2024-03-27 NOTE — Progress Notes (Signed)
 Spoke with pt advised him to do labs

## 2024-03-29 ENCOUNTER — Telehealth: Payer: Self-pay

## 2024-03-29 LAB — CBC WITH DIFFERENTIAL/PLATELET
Basophils Absolute: 0.1 x10E3/uL (ref 0.0–0.2)
Basos: 1 %
EOS (ABSOLUTE): 0.1 x10E3/uL (ref 0.0–0.4)
Eos: 2 %
Hematocrit: 44.1 % (ref 37.5–51.0)
Hemoglobin: 14.8 g/dL (ref 13.0–17.7)
Immature Grans (Abs): 0 x10E3/uL (ref 0.0–0.1)
Immature Granulocytes: 0 %
Lymphocytes Absolute: 2.4 x10E3/uL (ref 0.7–3.1)
Lymphs: 41 %
MCH: 32.5 pg (ref 26.6–33.0)
MCHC: 33.6 g/dL (ref 31.5–35.7)
MCV: 97 fL (ref 79–97)
Monocytes Absolute: 0.4 x10E3/uL (ref 0.1–0.9)
Monocytes: 6 %
Neutrophils Absolute: 2.9 x10E3/uL (ref 1.4–7.0)
Neutrophils: 50 %
Platelets: 255 x10E3/uL (ref 150–450)
RBC: 4.56 x10E6/uL (ref 4.14–5.80)
RDW: 12.8 % (ref 11.6–15.4)
WBC: 5.9 x10E3/uL (ref 3.4–10.8)

## 2024-03-29 LAB — CMP14+EGFR
ALT: 19 IU/L (ref 0–44)
AST: 20 IU/L (ref 0–40)
Albumin: 4.6 g/dL (ref 3.8–4.8)
Alkaline Phosphatase: 103 IU/L (ref 47–123)
BUN/Creatinine Ratio: 15 (ref 10–24)
BUN: 15 mg/dL (ref 8–27)
Bilirubin Total: 0.9 mg/dL (ref 0.0–1.2)
CO2: 25 mmol/L (ref 20–29)
Calcium: 9.9 mg/dL (ref 8.6–10.2)
Chloride: 102 mmol/L (ref 96–106)
Creatinine, Ser: 1.02 mg/dL (ref 0.76–1.27)
Globulin, Total: 2.4 g/dL (ref 1.5–4.5)
Glucose: 134 mg/dL — ABNORMAL HIGH (ref 70–99)
Potassium: 5.3 mmol/L — ABNORMAL HIGH (ref 3.5–5.2)
Sodium: 141 mmol/L (ref 134–144)
Total Protein: 7 g/dL (ref 6.0–8.5)
eGFR: 77 mL/min/1.73 (ref >59)

## 2024-03-29 LAB — LIPID PANEL
Chol/HDL Ratio: 2.4 ratio (ref 0.0–5.0)
Cholesterol, Total: 138 mg/dL (ref 100–199)
HDL: 57 mg/dL (ref >39)
LDL Chol Calc (NIH): 52 mg/dL (ref 0–99)
Triglycerides: 179 mg/dL — ABNORMAL HIGH (ref 0–149)
VLDL Cholesterol Cal: 29 mg/dL (ref 5–40)

## 2024-03-29 LAB — HGB A1C W/O EAG: Hgb A1c MFr Bld: 6.1 % — ABNORMAL HIGH (ref 4.8–5.6)

## 2024-04-02 NOTE — Telephone Encounter (Signed)
 Pt notified that he is already on Crestor  advised him to watch diet exercise and cut fatty fried food and alyssa discuss lab at next visit in detail

## 2024-04-06 ENCOUNTER — Other Ambulatory Visit: Payer: Self-pay

## 2024-04-06 MED ORDER — CYCLOBENZAPRINE HCL 5 MG PO TABS: 5.0000 mg | ORAL_TABLET | Freq: Every evening | ORAL | 0 refills | Status: AC | PRN

## 2024-04-06 NOTE — Telephone Encounter (Signed)
 Pt called that he is having back pain and muscle spasm as per alyssa sent cyclobenzaprine  advised him its make him drowsy

## 2024-04-16 ENCOUNTER — Ambulatory Visit: Payer: Self-pay | Admitting: Nurse Practitioner

## 2024-04-16 NOTE — Progress Notes (Signed)
 The results have been reviewed and will be discussed with the patient at her upcoming office visit.

## 2024-05-03 ENCOUNTER — Ambulatory Visit (INDEPENDENT_AMBULATORY_CARE_PROVIDER_SITE_OTHER): Admitting: Nurse Practitioner

## 2024-05-03 ENCOUNTER — Encounter: Payer: Self-pay | Admitting: Nurse Practitioner

## 2024-05-03 VITALS — BP 135/70 | HR 97 | Temp 98.0°F | Resp 16 | Ht 70.0 in | Wt 208.8 lb

## 2024-05-03 DIAGNOSIS — M5442 Lumbago with sciatica, left side: Secondary | ICD-10-CM

## 2024-05-03 DIAGNOSIS — E1159 Type 2 diabetes mellitus with other circulatory complications: Secondary | ICD-10-CM

## 2024-05-03 DIAGNOSIS — K219 Gastro-esophageal reflux disease without esophagitis: Secondary | ICD-10-CM | POA: Diagnosis not present

## 2024-05-03 DIAGNOSIS — I152 Hypertension secondary to endocrine disorders: Secondary | ICD-10-CM | POA: Diagnosis not present

## 2024-05-03 DIAGNOSIS — E1169 Type 2 diabetes mellitus with other specified complication: Secondary | ICD-10-CM

## 2024-05-03 DIAGNOSIS — M0579 Rheumatoid arthritis with rheumatoid factor of multiple sites without organ or systems involvement: Secondary | ICD-10-CM

## 2024-05-03 DIAGNOSIS — E785 Hyperlipidemia, unspecified: Secondary | ICD-10-CM | POA: Diagnosis not present

## 2024-05-03 DIAGNOSIS — C61 Malignant neoplasm of prostate: Secondary | ICD-10-CM | POA: Diagnosis not present

## 2024-05-03 DIAGNOSIS — G8929 Other chronic pain: Secondary | ICD-10-CM | POA: Diagnosis not present

## 2024-05-03 DIAGNOSIS — M25551 Pain in right hip: Secondary | ICD-10-CM | POA: Diagnosis not present

## 2024-05-03 DIAGNOSIS — M5441 Lumbago with sciatica, right side: Secondary | ICD-10-CM | POA: Diagnosis not present

## 2024-05-03 MED ORDER — OLMESARTAN MEDOXOMIL 5 MG PO TABS: 5.0000 mg | ORAL_TABLET | Freq: Every day | ORAL | 1 refills | Status: AC

## 2024-05-03 MED ORDER — ROSUVASTATIN CALCIUM 20 MG PO TABS: 20.0000 mg | ORAL_TABLET | Freq: Every day | ORAL | 3 refills | Status: AC

## 2024-05-03 MED ORDER — TRAMADOL HCL 50 MG PO TABS: 50.0000 mg | ORAL_TABLET | Freq: Every evening | ORAL | 0 refills | Status: AC | PRN

## 2024-05-03 MED ORDER — METFORMIN HCL 500 MG PO TABS: 500.0000 mg | ORAL_TABLET | Freq: Two times a day (BID) | ORAL | 1 refills | Status: AC

## 2024-05-03 MED ORDER — OXYCODONE HCL 5 MG PO TABS: 5.0000 mg | ORAL_TABLET | Freq: Three times a day (TID) | ORAL | 0 refills | Status: AC | PRN

## 2024-05-03 MED ORDER — MELOXICAM 15 MG PO TABS: 15.0000 mg | ORAL_TABLET | Freq: Every day | ORAL | 3 refills | Status: AC

## 2024-05-03 MED ORDER — METHOTREXATE SODIUM 2.5 MG PO TABS: 15.0000 mg | ORAL_TABLET | ORAL | 1 refills | Status: AC

## 2024-05-03 MED ORDER — PANTOPRAZOLE SODIUM 40 MG PO TBEC: 80.0000 mg | DELAYED_RELEASE_TABLET | Freq: Every day | ORAL | 3 refills | Status: AC

## 2024-05-03 MED ORDER — FOLIC ACID 1 MG PO TABS: ORAL_TABLET | ORAL | 1 refills | Status: AC

## 2024-05-03 NOTE — Progress Notes (Signed)
 [facility] [address] [place], [place] [address]  Internal MEDICINE  Office Visit Note  Patient Name: Russell Sullivan  [id number]  [id number]  Date of Service: 05/03/2024  Chief Complaint  Patient presents with   Diabetes   Hypertension   Hyperlipidemia   Follow-up    Diabetes Pertinent negatives for hypoglycemia include no nervousness/anxiousness or tremors. Pertinent negatives for diabetes include no chest pain and no fatigue.  Hypertension Pertinent negatives include no chest pain, neck pain, palpitations or shortness of breath.  Hyperlipidemia Pertinent negatives include no chest pain or shortness of breath.   Maze presents for a follow-up visit for chronic back pain, chronic hip pain, GERD, high cholesterol, hypertension.  Chronic right hip pain, worsening further over the last couple of weeks. Pain radiates down his right leg as well.  Chronic back pain -- takes oxycodone  as needed.  Hypertension -- controlled with olmesartan .  GERD -- symptoms controlled with PPI High cholesterol -- on high intensity statin therapy  Diabetes -- A1c in December was 6.1 which is stable. He takes metformin  daily. He is due for urine ACR test.  Prostate cancer -- He finished radiation treatment in 2023, he is having routine follow up about 2 times a year with radiation oncology.  RA -- he is currently on methotrexate  and folic acid .    Current Medication: Outpatient Encounter Medications as of 05/03/2024  Medication Sig   Accu-Chek Softclix Lancets lancets Use to check blood sugars twice a week E11.65   albuterol  (VENTOLIN  HFA) 108 (90 Base) MCG/ACT inhaler Inhale 2 puffs into the lungs every 6 (six) hours as needed for wheezing or shortness of breath.   aspirin 81 MG EC tablet Take by mouth.   clindamycin (CLEOCIN T) 1 % external solution Apply topically daily.   cyclobenzaprine  (FLEXERIL ) 5 MG tablet Take 1 tablet (5 mg total) by mouth at bedtime as needed for muscle  spasms.   fluticasone  (FLONASE ) 50 MCG/ACT nasal spray Place 2 sprays into both nostrils daily.   folic acid  (FOLVITE ) 1 MG tablet TAKE 1 TABLET BY MOUTH ONCE DAILY   glucose blood (ACCU-CHEK GUIDE) test strip check blood sugars twice a day E11.65   hydrocortisone  ointment 0.5 % Apply 1 application topically 2 (two) times daily. Prn only for eczema   meloxicam  (MOBIC ) 15 MG tablet Take 1 tablet (15 mg total) by mouth daily.   metFORMIN  (GLUCOPHAGE ) 500 MG tablet Take 1 tablet (500 mg total) by mouth 2 (two) times daily with a meal.   methotrexate  (RHEUMATREX) 2.5 MG tablet Take 6 tablets (15 mg total) by mouth once a week. Caution:Chemotherapy. Protect from light.   olmesartan  (BENICAR ) 5 MG tablet Take 1 tablet (5 mg total) by mouth daily.   [START ON 06/28/2024] oxyCODONE  (OXY IR/ROXICODONE ) 5 MG immediate release tablet Take 1 tablet (5 mg total) by mouth 3 (three) times daily as needed for severe pain (pain score 7-10).   [START ON 05/31/2024] oxyCODONE  (OXY IR/ROXICODONE ) 5 MG immediate release tablet Take 1 tablet (5 mg total) by mouth 3 (three) times daily as needed for severe pain (pain score 7-10).   oxyCODONE  (OXY IR/ROXICODONE ) 5 MG immediate release tablet Take 1 tablet (5 mg total) by mouth 3 (three) times daily as needed for severe pain (pain score 7-10).   pantoprazole  (PROTONIX ) 40 MG tablet Take 2 tablets (80 mg total) by mouth daily.   rosuvastatin  (CRESTOR ) 20 MG tablet Take 1 tablet (20 mg total) by mouth at bedtime.   [  START ON 05/21/2024] traMADol  (ULTRAM ) 50 MG tablet Take 1 tablet (50 mg total) by mouth at bedtime as needed for moderate pain (pain score 4-6) or severe pain (pain score 7-10).   [DISCONTINUED] folic acid  (FOLVITE ) 1 MG tablet TAKE 1 TABLET BY MOUTH ONCE DAILY   [DISCONTINUED] meloxicam  (MOBIC ) 15 MG tablet Take 1 tablet (15 mg total) by mouth daily.   [DISCONTINUED] metFORMIN  (GLUCOPHAGE ) 500 MG tablet Take 1 tablet (500 mg total) by mouth 2 (two) times daily with  a meal.   [DISCONTINUED] methotrexate  (RHEUMATREX) 2.5 MG tablet Take 6 tablets (15 mg total) by mouth once a week. Caution:Chemotherapy. Protect from light.   [DISCONTINUED] olmesartan  (BENICAR ) 5 MG tablet Take 1 tablet (5 mg total) by mouth daily.   [DISCONTINUED] oxyCODONE  (OXY IR/ROXICODONE ) 5 MG immediate release tablet Take 1 tablet (5 mg total) by mouth 3 (three) times daily as needed for severe pain (pain score 7-10).   [DISCONTINUED] oxyCODONE  (OXY IR/ROXICODONE ) 5 MG immediate release tablet Take 1 tablet (5 mg total) by mouth 3 (three) times daily as needed for severe pain (pain score 7-10).   [DISCONTINUED] oxyCODONE  (OXY IR/ROXICODONE ) 5 MG immediate release tablet Take 1 tablet (5 mg total) by mouth 3 (three) times daily as needed for severe pain (pain score 7-10).   [DISCONTINUED] pantoprazole  (PROTONIX ) 40 MG tablet Take 2 tablets (80 mg total) by mouth daily.   [DISCONTINUED] rosuvastatin  (CRESTOR ) 20 MG tablet Take 1 tablet (20 mg total) by mouth at bedtime.   [DISCONTINUED] traMADol  (ULTRAM ) 50 MG tablet Take 1 tablet (50 mg total) by mouth at bedtime as needed for moderate pain (pain score 4-6) or severe pain (pain score 7-10).   No facility-administered encounter medications on file as of 05/03/2024.    Surgical History: Past Surgical History:  Procedure Laterality Date   HERNIA REPAIR  2009   INTUBATION-ENDOTRACHEAL WITH TRACHEOSTOMY STANDBY N/A 01/13/2018   Procedure: INTUBATION-ENDOTRACHEAL WITH TRACHEOSTOMY STANDBY;  Surgeon: Herminio Miu, MD;  Location: [facility] ORS;  Service: ENT;  Laterality: N/A;   left shoulder surgery      Medical History: Past Medical History:  Diagnosis Date   Adenocarcinoma of prostate (HCC)    Diabetes mellitus without complication (HCC)    Hyperlipidemia    Hypertension     Family History: Family History  Problem Relation Age of Onset   Hypertension Mother    Diabetes Mother    Hypertension Father    Diabetes Maternal  Grandmother    Diabetes Paternal Grandmother     Social History   Socioeconomic History   Marital status: Married    Spouse name: Not on file   Number of children: Not on file   Years of education: Not on file   Highest education level: Not on file  Occupational History   Not on file  Tobacco Use   Smoking status: Former    Types: Cigarettes   Smokeless tobacco: Never   Tobacco comments:    20 year ago  Substance and Sexual Activity   Alcohol use: Yes    Comment: ocassionally   Drug use: No   Sexual activity: Not on file  Other Topics Concern   Not on file  Social History Narrative   Not on file   Social Drivers of Health   Tobacco Use: Medium Risk (05/03/2024)   Patient History    Smoking Tobacco Use: Former    Smokeless Tobacco Use: Never    Passive Exposure: Not on Actuary Strain:  Low Risk (02/07/2023)   Overall Financial Resource Strain (CARDIA)    Difficulty of Paying Living Expenses: Not hard at all  Food Insecurity: No Food Insecurity (02/07/2023)   Hunger Vital Sign    Worried About Running Out of Food in the Last Year: Never true    Ran Out of Food in the Last Year: Never true  Transportation Needs: No Transportation Needs (02/07/2023)   PRAPARE - Administrator, Civil Service (Medical): No    Lack of Transportation (Non-Medical): No  Physical Activity: Insufficiently Active (02/07/2023)   Exercise Vital Sign    Days of Exercise per Week: 4 days    Minutes of Exercise per Session: 30 min  Stress: No Stress Concern Present (02/07/2023)   [employer] of Occupational Health - Occupational Stress Questionnaire    Feeling of Stress : Only a little  Social Connections: Moderately Isolated (02/14/2023)   Social Connection and Isolation Panel    Frequency of Communication with Friends and Family: Once a week    Frequency of Social Gatherings with Friends and Family: Once a week    Attends Religious Services: 1 to 4 times per  year    Active Member of [employer] or Organizations: No    Attends Banker Meetings: Never    Marital Status: Married  Catering Manager Violence: Not At Risk (02/07/2023)   Humiliation, Afraid, Rape, and Kick questionnaire    Fear of Current or Ex-Partner: No    Emotionally Abused: No    Physically Abused: No    Sexually Abused: No  Depression (PHQ2-9): Low Risk (02/08/2024)   Depression (PHQ2-9)    PHQ-2 Score: 0  Alcohol Screen: Low Risk (02/14/2023)   Alcohol Screen    Last Alcohol Screening Score (AUDIT): 1  Housing: Low Risk (02/07/2023)   Housing    Last Housing Risk Score: 0  Utilities: Not At Risk (02/07/2023)   AHC Utilities    Threatened with loss of utilities: No  Health Literacy: Adequate Health Literacy (02/07/2023)   B1300 Health Literacy    Frequency of need for help with medical instructions: Never      Review of Systems  Constitutional:  Negative for chills, fatigue and unexpected weight change.  HENT:  Negative for congestion, rhinorrhea, sneezing and sore throat.   Eyes:  Negative for redness.  Respiratory:  Negative for cough, chest tightness and shortness of breath.   Cardiovascular:  Negative for chest pain and palpitations.  Gastrointestinal:  Negative for abdominal pain, constipation, diarrhea, nausea and vomiting.  Genitourinary:  Negative for dysuria and frequency.  Musculoskeletal:  Positive for arthralgias. Negative for back pain, joint swelling and neck pain.  Skin:  Negative for rash.  Neurological: Negative.  Negative for tremors and numbness.  Hematological:  Negative for adenopathy. Does not bruise/bleed easily.  Psychiatric/Behavioral:  Negative for behavioral problems (Depression), sleep disturbance and suicidal ideas. The patient is not nervous/anxious.     Vital Signs: BP 135/70 Comment: 150/86  Pulse 97   Temp 98 F (36.7 C)   Resp 16   Ht 5' 10 (1.778 m)   Wt 208 lb 12.8 oz (94.7 kg)   SpO2 99%   BMI 29.96 kg/m     Physical Exam Vitals reviewed.  Constitutional:      General: He is not in acute distress.    Appearance: Normal appearance. He is not ill-appearing.  HENT:     Head: Normocephalic and atraumatic.  Eyes:     Pupils: Pupils  are equal, round, and reactive to light.  Cardiovascular:     Rate and Rhythm: Normal rate and regular rhythm.  Pulmonary:     Effort: Pulmonary effort is normal. No respiratory distress.  Neurological:     Mental Status: He is alert and oriented to person, place, and time.  Psychiatric:        Mood and Affect: Mood normal.        Behavior: Behavior normal.        Assessment/Plan: 1. Type 2 diabetes mellitus with other specified complication, without long-term current use of insulin (HCC) (Primary) Stable, A1c is 6.1. continue metformin  as prescribed and continue diabetic diet as previously discussed.  - metFORMIN  (GLUCOPHAGE ) 500 MG tablet; Take 1 tablet (500 mg total) by mouth 2 (two) times daily with a meal.  Dispense: 180 tablet; Refill: 1  2. Hypertension associated with type 2 diabetes mellitus (HCC) Stable, continue olmesartan  as prescribed.  - olmesartan  (BENICAR ) 5 MG tablet; Take 1 tablet (5 mg total) by mouth daily.  Dispense: 90 tablet; Refill: 1  3. Hyperlipidemia associated with type 2 diabetes mellitus (HCC) Continue rosuvastatin  as prescribed.  - rosuvastatin  (CRESTOR ) 20 MG tablet; Take 1 tablet (20 mg total) by mouth at bedtime.  Dispense: 90 tablet; Refill: 3  4. Gastro-esophageal reflux disease without esophagitis Continue pantoprazole  as prescribed.  - pantoprazole  (PROTONIX ) 40 MG tablet; Take 2 tablets (80 mg total) by mouth daily.  Dispense: 180 tablet; Refill: 3  5. Chronic right hip pain Referred to [facility] in [place] urgently  - Ambulatory referral to Orthopedic Surgery  6. Chronic bilateral low back pain with bilateral sciatica Continue prn tramadol  and prn oxycodone  as prescribed. Follow up in 3 months for  additional refills.  - traMADol  (ULTRAM ) 50 MG tablet; Take 1 tablet (50 mg total) by mouth at bedtime as needed for moderate pain (pain score 4-6) or severe pain (pain score 7-10).  Dispense: 30 tablet; Refill: 0 - oxyCODONE  (OXY IR/ROXICODONE ) 5 MG immediate release tablet; Take 1 tablet (5 mg total) by mouth 3 (three) times daily as needed for severe pain (pain score 7-10).  Dispense: 90 tablet; Refill: 0 - oxyCODONE  (OXY IR/ROXICODONE ) 5 MG immediate release tablet; Take 1 tablet (5 mg total) by mouth 3 (three) times daily as needed for severe pain (pain score 7-10).  Dispense: 90 tablet; Refill: 0 - oxyCODONE  (OXY IR/ROXICODONE ) 5 MG immediate release tablet; Take 1 tablet (5 mg total) by mouth 3 (three) times daily as needed for severe pain (pain score 7-10).  Dispense: 90 tablet; Refill: 0  7. Rheumatoid arthritis involving multiple sites with positive rheumatoid factor (HCC) Continue daily meloxicam  and prn oxycodone  as prescribed. Follow up in 3 months for additional oxycodone  refills. Continue methotrexate  and folic acid  as prescribed.  - oxyCODONE  (OXY IR/ROXICODONE ) 5 MG immediate release tablet; Take 1 tablet (5 mg total) by mouth 3 (three) times daily as needed for severe pain (pain score 7-10).  Dispense: 90 tablet; Refill: 0 - oxyCODONE  (OXY IR/ROXICODONE ) 5 MG immediate release tablet; Take 1 tablet (5 mg total) by mouth 3 (three) times daily as needed for severe pain (pain score 7-10).  Dispense: 90 tablet; Refill: 0 - oxyCODONE  (OXY IR/ROXICODONE ) 5 MG immediate release tablet; Take 1 tablet (5 mg total) by mouth 3 (three) times daily as needed for severe pain (pain score 7-10).  Dispense: 90 tablet; Refill: 0 - meloxicam  (MOBIC ) 15 MG tablet; Take 1 tablet (15 mg total) by mouth daily.  Dispense: 90  tablet; Refill: 3 - methotrexate  (RHEUMATREX) 2.5 MG tablet; Take 6 tablets (15 mg total) by mouth once a week. Caution:Chemotherapy. Protect from light.  Dispense: 84 tablet; Refill: 1 -  folic acid  (FOLVITE ) 1 MG tablet; TAKE 1 TABLET BY MOUTH ONCE DAILY  Dispense: 90 tablet; Refill: 1  8. Malignant neoplasm of prostate [facility]) Continue follow up with radiation oncology as scheduled    General Counseling: Duan verbalizes understanding of the findings of todays visit and agrees with plan of treatment. I have discussed any further diagnostic evaluation that may be needed or ordered today. We also reviewed his medications today. he has been encouraged to call the office with any questions or concerns that should arise related to todays visit.    Orders Placed This Encounter  Procedures   Ambulatory referral to Orthopedic Surgery    Meds ordered this encounter  Medications   traMADol  (ULTRAM ) 50 MG tablet    Sig: Take 1 tablet (50 mg total) by mouth at bedtime as needed for moderate pain (pain score 4-6) or severe pain (pain score 7-10).    Dispense:  30 tablet    Refill:  0    For future refills   oxyCODONE  (OXY IR/ROXICODONE ) 5 MG immediate release tablet    Sig: Take 1 tablet (5 mg total) by mouth 3 (three) times daily as needed for severe pain (pain score 7-10).    Dispense:  90 tablet    Refill:  0    Fill for march   oxyCODONE  (OXY IR/ROXICODONE ) 5 MG immediate release tablet    Sig: Take 1 tablet (5 mg total) by mouth 3 (three) times daily as needed for severe pain (pain score 7-10).    Dispense:  90 tablet    Refill:  0    Fill for February   oxyCODONE  (OXY IR/ROXICODONE ) 5 MG immediate release tablet    Sig: Take 1 tablet (5 mg total) by mouth 3 (three) times daily as needed for severe pain (pain score 7-10).    Dispense:  90 tablet    Refill:  0    Fill for january   meloxicam  (MOBIC ) 15 MG tablet    Sig: Take 1 tablet (15 mg total) by mouth daily.    Dispense:  90 tablet    Refill:  3    Please fill as 90 day prescription   olmesartan  (BENICAR ) 5 MG tablet    Sig: Take 1 tablet (5 mg total) by mouth daily.    Dispense:  90 tablet    Refill:  1     Discontinue amlodipine  and fill new script today   metFORMIN  (GLUCOPHAGE ) 500 MG tablet    Sig: Take 1 tablet (500 mg total) by mouth 2 (two) times daily with a meal.    Dispense:  180 tablet    Refill:  1    For future refills, note increased frequency   methotrexate  (RHEUMATREX) 2.5 MG tablet    Sig: Take 6 tablets (15 mg total) by mouth once a week. Caution:Chemotherapy. Protect from light.    Dispense:  84 tablet    Refill:  1   folic acid  (FOLVITE ) 1 MG tablet    Sig: TAKE 1 TABLET BY MOUTH ONCE DAILY    Dispense:  90 tablet    Refill:  1   rosuvastatin  (CRESTOR ) 20 MG tablet    Sig: Take 1 tablet (20 mg total) by mouth at bedtime.    Dispense:  90 tablet  Refill:  3   pantoprazole  (PROTONIX ) 40 MG tablet    Sig: Take 2 tablets (80 mg total) by mouth daily.    Dispense:  180 tablet    Refill:  3    Return in about 3 months (around 07/25/2024) for F/U, pain med refill, Shafer Swamy PCP.   Total time spent:30 Minutes Time spent includes review of chart, medications, test results, and follow up plan with the patient.   [place] Controlled Substance Database was reviewed by me.  This patient was seen by Mardy Maxin, FNP-C in collaboration with Dr. Sigrid Bathe as a part of collaborative care agreement.   Lilla Callejo R. Maxin, MSN, FNP-C Internal medicine

## 2024-05-03 NOTE — Addendum Note (Signed)
 Addended by: DONELDA JAIN on: 05/03/2024 09:15 AM   Modules accepted: Orders

## 2024-05-04 ENCOUNTER — Telehealth: Payer: Self-pay | Admitting: Nurse Practitioner

## 2024-05-04 LAB — MICROALBUMIN / CREATININE URINE RATIO
Creatinine, Urine: 49.3 mg/dL
Microalb/Creat Ratio: 22 mg/g{creat} (ref 0–29)
Microalbumin, Urine: 10.9 ug/mL

## 2024-05-04 NOTE — Telephone Encounter (Signed)
 Urgent Orthopedic referral sent via [facility] to [facility]. Telephone # [phone]. Left patient vm-Toni

## 2024-07-30 ENCOUNTER — Ambulatory Visit: Admitting: Nurse Practitioner

## 2025-02-08 ENCOUNTER — Ambulatory Visit: Admitting: Nurse Practitioner
